# Patient Record
Sex: Male | Born: 1990 | Race: Black or African American | Hispanic: No | Marital: Single | Smoking: Former smoker
Health system: Southern US, Community
[De-identification: ages and names within clinical notes are randomized; demographics above are authoritative.]

## PROBLEM LIST (undated history)

## (undated) DIAGNOSIS — M311 Thrombotic microangiopathy: Secondary | ICD-10-CM

## (undated) DIAGNOSIS — M3119 Other thrombotic microangiopathy: Secondary | ICD-10-CM

## (undated) DIAGNOSIS — D573 Sickle-cell trait: Secondary | ICD-10-CM

---
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
## 2010-05-01 LAB — RAPID STREP SCREEN (MED CTR MEBANE ONLY): Streptococcus, Group A Screen (Direct): NEGATIVE

## 2013-04-17 ENCOUNTER — Encounter (HOSPITAL_COMMUNITY): Payer: Self-pay | Admitting: Emergency Medicine

## 2013-04-17 ENCOUNTER — Emergency Department (HOSPITAL_COMMUNITY)
Admission: EM | Admit: 2013-04-17 | Discharge: 2013-04-18 | Disposition: A | Discharge status: Home / Self Care | Payer: BC Managed Care – PPO | Attending: Emergency Medicine | Admitting: Emergency Medicine

## 2013-04-17 DIAGNOSIS — Z862 Personal history of diseases of the blood and blood-forming organs and certain disorders involving the immune mechanism: Secondary | ICD-10-CM | POA: Insufficient documentation

## 2013-04-17 DIAGNOSIS — R8271 Bacteriuria: Secondary | ICD-10-CM

## 2013-04-17 DIAGNOSIS — R82998 Other abnormal findings in urine: Secondary | ICD-10-CM | POA: Insufficient documentation

## 2013-04-17 DIAGNOSIS — F17200 Nicotine dependence, unspecified, uncomplicated: Secondary | ICD-10-CM | POA: Insufficient documentation

## 2013-04-17 HISTORY — DX: Sickle-cell trait: D57.3

## 2013-04-17 NOTE — ED Notes (Signed)
PTin BR to collect urine sample.

## 2013-04-17 NOTE — ED Notes (Signed)
Pt. requesting STD check , reports " tingling" at shaft of penis and low bladder " burning ' . Denies dysuria or hematuria. No penile discharge .

## 2013-04-17 NOTE — ED Provider Notes (Signed)
CSN: [id number]     Arrival date & time 04/17/13  2118 History   First MD Initiated Contact with Patient 04/17/13 2322    This chart was scribed for non-physician practitioner Felicie Mornavid Kashius Dominic, NP working with Hanley SeamenJohn L Molpus, MD by Valera CastleSteven Perry, ED scribe. This patient was seen in room TR10C/TR10C and the patient's care was started at 11:22 PM.  Chief Complaint  Patient presents with  . SEXUALLY TRANSMITTED DISEASE   (Consider location/radiation/quality/duration/timing/severity/associated sxs/prior Treatment) The history is provided by the patient. No language interpreter was used.   HPI Comments: Richard [name] is a 23 y.o. male who presents to the Emergency Department complaining of intermittent, burning, tingling, and itchiness around the shaft of his penis, that intermittently radiates to his testes, onset 3 days ago. He reports being sexually active, states he does not use protection. He reports only having one partner, states she does not have any symptoms. He reports being sexually active prior to this partner, sometimes with and sometimes without protection. He denies being around any unusual chemicals. He denies rash and any other associated symptoms.   PCP - No primary provider on file.  Past Medical History  Diagnosis Date  . Sickle cell trait    History reviewed. No pertinent past surgical history. No family history on file. History  Substance Use Topics  . Smoking status: Current Every Day Smoker  . Smokeless tobacco: Not on file  . Alcohol Use: Yes    Review of Systems  Constitutional: Negative for fever and chills.  Genitourinary: Negative for dysuria, hematuria, discharge, penile swelling, scrotal swelling, difficulty urinating, penile pain and testicular pain.       + for burning, tingling, itchiness around shaft of penis  Skin: Negative for rash.  All other systems reviewed and are negative.   Allergies  Review of patient's allergies indicates no known  allergies.  Home Medications  No current outpatient prescriptions on file.  BP 140/80  Pulse 96  Temp(Src) 99.1 F (37.3 C) (Oral)  Resp 20  Wt 284 lb 4 oz (128.935 kg)  SpO2 98%  Physical Exam  Nursing note and vitals reviewed. Constitutional: He is oriented to person, place, and time. He appears well-developed and well-nourished. No distress.  HENT:  Head: Normocephalic and atraumatic.  Eyes: EOM are normal.  Neck: Neck supple. No tracheal deviation present.  Cardiovascular: Normal rate.   Pulmonary/Chest: Effort normal. No respiratory distress.  Genitourinary: No penile tenderness.  Normal appearing genitalia. No lesions, rash, testicular pain.   Musculoskeletal: Normal range of motion.  Neurological: He is alert and oriented to person, place, and time.  Skin: Skin is warm and dry.  Psychiatric: He has a normal mood and affect. His behavior is normal.   ED Course  Procedures (including critical care time)  DIAGNOSTIC STUDIES: Oxygen Saturation is 98% on room air, normal by my interpretation.    COORDINATION OF CARE: 11:29 PM-Discussed treatment plan which includes UA with pt at bedside and pt agreed to plan.   Labs Review Labs Reviewed - No data to display Imaging Review No results found.   EKG Interpretation None     Medications - No data to display MDM   Final diagnoses:  None    Asymptomatic bacteruria.  Will culture and contact patient if treatment is indicated.  I personally performed the services described in this documentation, which was scribed in my presence. The recorded information has been reviewed and is accurate.    Jimmye Normanavid John Michella Detjen, NP 04/18/13  0307 

## 2013-04-18 LAB — URINALYSIS, ROUTINE W REFLEX MICROSCOPIC
Bilirubin Urine: NEGATIVE
GLUCOSE, UA: NEGATIVE mg/dL
HGB URINE DIPSTICK: NEGATIVE
Ketones, ur: NEGATIVE mg/dL
Nitrite: NEGATIVE
PH: 6 (ref 5.0–8.0)
PROTEIN: NEGATIVE mg/dL
SPECIFIC GRAVITY, URINE: 1.02 (ref 1.005–1.030)
Urobilinogen, UA: 0.2 mg/dL (ref 0.0–1.0)

## 2013-04-18 LAB — URINE MICROSCOPIC-ADD ON

## 2013-04-18 NOTE — ED Provider Notes (Signed)
Medical screening examination/treatment/procedure(s) were performed by non-physician practitioner and as supervising physician I was immediately available for consultation/collaboration.   EKG Interpretation None        Hervey Wedig, MD 04/18/13 1017 

## 2013-04-18 NOTE — Discharge Instructions (Signed)
Your urinalysis reveals some white blood cells and bacteria, although you are not currently exhibiting symptoms of a urinary tract infection.  We will culture the urine to see if treatment is needed.  You will be contacted by the flow manager if your urine culture indicates additional treatment is needed.  If you notice vesicles (small, fluid filled blisters) developing in your genital area with continued burning/itching, follow-up with the health department for evaluation of herpes.

## 2013-04-19 ENCOUNTER — Encounter (HOSPITAL_COMMUNITY): Payer: Self-pay | Admitting: Emergency Medicine

## 2013-04-19 ENCOUNTER — Emergency Department (HOSPITAL_COMMUNITY)
Admission: EM | Admit: 2013-04-19 | Discharge: 2013-04-19 | Disposition: A | Discharge status: Home / Self Care | Payer: BC Managed Care – PPO | Source: Home / Self Care | Attending: Family Medicine | Admitting: Family Medicine

## 2013-04-19 ENCOUNTER — Other Ambulatory Visit (HOSPITAL_COMMUNITY)
Admission: RE | Admit: 2013-04-19 | Discharge: 2013-04-19 | Disposition: A | Discharge status: Home / Self Care | Payer: BC Managed Care – PPO | Source: Ambulatory Visit | Attending: Family Medicine | Admitting: Family Medicine

## 2013-04-19 DIAGNOSIS — Z113 Encounter for screening for infections with a predominantly sexual mode of transmission: Secondary | ICD-10-CM | POA: Insufficient documentation

## 2013-04-19 DIAGNOSIS — N342 Other urethritis: Secondary | ICD-10-CM

## 2013-04-19 LAB — URINE CULTURE
Colony Count: 40000
Special Requests: NORMAL

## 2013-04-19 LAB — POCT URINALYSIS DIP (DEVICE)
Bilirubin Urine: NEGATIVE
Glucose, UA: NEGATIVE mg/dL
Hgb urine dipstick: NEGATIVE
KETONES UR: NEGATIVE mg/dL
Leukocytes, UA: NEGATIVE
Nitrite: NEGATIVE
PH: 5.5 (ref 5.0–8.0)
PROTEIN: NEGATIVE mg/dL
SPECIFIC GRAVITY, URINE: 1.025 (ref 1.005–1.030)
Urobilinogen, UA: 0.2 mg/dL (ref 0.0–1.0)

## 2013-04-19 MED ORDER — AZITHROMYCIN 250 MG PO TABS
1000.0000 mg | ORAL_TABLET | Freq: Once | ORAL | Status: AC
  Administered 2013-04-19: 1000 mg via ORAL

## 2013-04-19 MED ORDER — LIDOCAINE HCL (PF) 1 % IJ SOLN
INTRAMUSCULAR | Status: AC
  Filled 2013-04-19: qty 5

## 2013-04-19 MED ORDER — AZITHROMYCIN 250 MG PO TABS
ORAL_TABLET | ORAL | Status: AC
  Filled 2013-04-19: qty 4

## 2013-04-19 MED ORDER — CEFTRIAXONE SODIUM 250 MG IJ SOLR
INTRAMUSCULAR | Status: AC
  Filled 2013-04-19: qty 250

## 2013-04-19 MED ORDER — CEFTRIAXONE SODIUM 250 MG IJ SOLR
250.0000 mg | Freq: Once | INTRAMUSCULAR | Status: AC
  Administered 2013-04-19: 250 mg via INTRAMUSCULAR

## 2013-04-19 NOTE — ED Notes (Signed)
Low pelvic pain with slightly more intensity to the left.  Reports feeling the need to urinate badly, but only puts a small amount of urine when voiding.  Pain in abdomen is sharp

## 2013-04-19 NOTE — ED Notes (Signed)
Patient not ready for discharge, patient aware of post injection delay prior to discharge

## 2013-04-19 NOTE — Discharge Instructions (Signed)
You have been treated for some common causes of urethritis in adult males (chlamydia and gonorrhea) If your lab results indicate the need for additional treatment, you will be notified by telephone. If symptoms do not improve, please follow up with the primary care doctor of your choice.  Urethritis, Adult Urethritis is an inflammation of the tube through which urine exits your bladder (urethra).  CAUSES Urethritis is often caused by an infection in your urethra. The infection can be viral, like herpes. The infection can also be bacterial, like gonorrhea. RISK FACTORS Risk factors of urethritis include:  Having sex without using a condom.  Having multiple sexual partners.  Having poor hygiene. SIGNS AND SYMPTOMS Symptoms of urethritis are less noticeable in women than in men. These symptoms include:  Burning feeling when you urinate (dysuria).  Discharge from your urethra.  Blood in your urine (hematuria).  Urinating more than usual. DIAGNOSIS  To confirm a diagnosis of urethritis, your health care provider will do the following:  Ask about your sexual history.  Perform a physical exam.  Have you provide a sample of your urine for lab testing.  Use a cotton swab to gently collect a sample from your urethra for lab testing. TREATMENT  It is important to treat urethritis. Depending on the cause, untreated urethritis may lead to serious genital infections and possibly infertility. Urethritis caused by a bacterial infection is treated with antibiotics. All sexual partners must be treated.  HOME CARE INSTRUCTIONS  Do not have sex until the test results are known and treatment is completed, even if your symptoms go away before you finish treatment.  Finish all medicines that you are prescribed. SEEK MEDICAL CARE IF:   Your symptoms are not improved in 3 days.  Your symptoms are getting worse.  You develop abdominal pain or pelvic pain (in women).  You develop joint  pain. SEEK IMMEDIATE MEDICAL CARE IF:   You have a fever with a temperature of 101.67F (38.8C) or greater.  You have severe pain in the belly, back, or side.  You have repeated vomiting. Document Released: 07/08/2000 Document Revised: 11/02/2012 Document Reviewed: 09/12/2012 [facility] Patient Information 2014 [employer] [place].

## 2013-04-19 NOTE — ED Provider Notes (Signed)
CSN: [id number]     Arrival date & time 04/19/13  1346 History   First MD Initiated Contact with Patient 04/19/13 1549     Chief Complaint  Patient presents with  . Pelvic Pain   (Consider location/radiation/quality/duration/timing/severity/associated sxs/prior Treatment) HPI Comments: Patient reports few days of urinary urgency with associated frequency. Reports he is only able to urinate in small amount but feels that he needs to urinate often. No polydipsia. No genital lesions or penile discharge. States his has a "tingling" discomfort in area of mons pubis. No fever, abdominal pain, back pain, flank pain, chills, hematuria or dysuria. No hx of same.  [facility]   Patient is a 23 y.o. male presenting with pelvic pain. The history is provided by the patient.  Pelvic Pain    Past Medical History  Diagnosis Date  . Sickle cell trait    History reviewed. No pertinent past surgical history. History reviewed. No pertinent family history. History  Substance Use Topics  . Smoking status: Current Every Day Smoker  . Smokeless tobacco: Not on file  . Alcohol Use: Yes    Review of Systems  Genitourinary: Positive for pelvic pain.  All other systems reviewed and are negative.    Allergies  Review of patient's allergies indicates no known allergies.  Home Medications  No current outpatient prescriptions on file. BP 143/71  Pulse 72  Temp(Src) 98.6 F (37 C) (Oral)  Resp 16  SpO2 97% Physical Exam  Nursing note and vitals reviewed. Constitutional: He is oriented to person, place, and time. He appears well-developed and well-nourished. No distress.  HENT:  Head: Normocephalic and atraumatic.  Eyes: Conjunctivae are normal.  Cardiovascular: Normal rate, regular rhythm and normal heart sounds.   Pulmonary/Chest: Effort normal and breath sounds normal.  Abdominal: Soft. Bowel sounds are normal. He exhibits no distension and no mass. There is no  tenderness. There is no rebound, no guarding and no CVA tenderness. No hernia. Hernia confirmed negative in the ventral area, confirmed negative in the right inguinal area and confirmed negative in the left inguinal area.  Genitourinary: Testes normal and penis normal. Cremasteric reflex is present. Right testis shows no mass, no swelling and no tenderness. Left testis shows no mass, no swelling and no tenderness. Circumcised. No penile erythema or penile tenderness. No discharge found.  Musculoskeletal: Normal range of motion.  Lymphadenopathy:       Right: No inguinal adenopathy present.       Left: No inguinal adenopathy present.  Neurological: He is alert and oriented to person, place, and time.  Skin: Skin is warm and dry. No rash noted.  Psychiatric: He has a normal mood and affect. His behavior is normal.    ED Course  Procedures (including critical care time) Labs Review Labs Reviewed  URINE CULTURE  RPR  HIV ANTIBODY (ROUTINE TESTING)  POCT URINALYSIS DIP (DEVICE)  URINE CYTOLOGY ANCILLARY ONLY   Imaging Review No results found.   MDM   1. Urethritis    Urethritis: Urine C&S reviewed from 04-17-2013 ER visit and culture results consistent with contaminated specimen. Will resend clean and dirty catch urine for C&S and cytology. Will treat empirically for STI urethritis and contact patient if labs indicate need for additional treatment. Today's UA unremarkable.   Jess BartersJennifer Lee [place], [place] 04/19/13 [phone]

## 2013-04-20 LAB — URINE CULTURE
COLONY COUNT: NO GROWTH
CULTURE: NO GROWTH

## 2013-04-20 LAB — HIV ANTIBODY (ROUTINE TESTING W REFLEX): HIV: NONREACTIVE

## 2013-04-20 LAB — URINE CYTOLOGY ANCILLARY ONLY
Chlamydia: POSITIVE — AB
Neisseria Gonorrhea: NEGATIVE
Trichomonas: NEGATIVE

## 2013-04-20 LAB — RPR: RPR Ser Ql: NONREACTIVE

## 2013-04-20 NOTE — ED Provider Notes (Signed)
Medical screening examination/treatment/procedure(s) were performed by a resident physician or non-physician practitioner and as the supervising physician I was immediately available for consultation/collaboration.  Maurica Omura, MD    Socrates Cahoon S Leara Rawl, MD 04/20/13 0804 

## 2013-04-21 ENCOUNTER — Telehealth (HOSPITAL_COMMUNITY): Payer: Self-pay | Admitting: *Deleted

## 2013-04-21 NOTE — ED Notes (Signed)
GC/Trich neg., Chlamydia pos., HIV/RPR non-reactive, Urine culture: No growth.   Pt. adequately treated with Zithromax and Rocephin.  I called pt.  Pt. verified x 2 and given results.  Pt. told he was adequately treated.  Pt. instructed to notify his partner, no sex for 1 week and to practice safe sex. Pt. told he should get [facility], by appointment.  Pt. said he still has symptoms. I told him if no sex for 1 week. If symptoms persist after 1 week to get rechecked.  Pt. voiced understanding. Vassie MoselleYork, Tonjia Parillo M 04/21/2013

## 2013-04-22 NOTE — ED Notes (Signed)
DHHS form completed and faxed to the [facility]. 04/22/2013  

## 2013-07-11 ENCOUNTER — Encounter (HOSPITAL_COMMUNITY): Payer: Self-pay | Admitting: Emergency Medicine

## 2013-07-11 ENCOUNTER — Emergency Department (HOSPITAL_COMMUNITY)
Admission: EM | Admit: 2013-07-11 | Discharge: 2013-07-11 | Discharge status: Against Medical Advice | Payer: BC Managed Care – PPO | Attending: Emergency Medicine | Admitting: Emergency Medicine

## 2013-07-11 DIAGNOSIS — F17200 Nicotine dependence, unspecified, uncomplicated: Secondary | ICD-10-CM | POA: Insufficient documentation

## 2013-07-11 DIAGNOSIS — J029 Acute pharyngitis, unspecified: Secondary | ICD-10-CM | POA: Insufficient documentation

## 2013-07-11 LAB — RAPID STREP SCREEN (MED CTR MEBANE ONLY): STREPTOCOCCUS, GROUP A SCREEN (DIRECT): NEGATIVE

## 2013-07-11 NOTE — ED Notes (Signed)
Pt. reports right sore throat with swelling / pain radiating to right ear onset last night , denies fever or chills, respirations unlabored / airway intact .

## 2013-07-11 NOTE — ED Notes (Signed)
Patient up to desk inquiring about wait time. Explained triage process and acuity treatment. Patient understanding, but wishes to leave. Encouraged patient to stay, but patient explains that he will seek care with PCP tomorrow. Patient returned pager and exited department.

## 2013-07-12 LAB — CULTURE, GROUP A STREP

## 2014-02-11 ENCOUNTER — Encounter (HOSPITAL_COMMUNITY): Payer: Self-pay | Admitting: *Deleted

## 2014-02-11 ENCOUNTER — Emergency Department (HOSPITAL_COMMUNITY)
Admission: EM | Admit: 2014-02-11 | Discharge: 2014-02-11 | Disposition: A | Discharge status: Home / Self Care | Payer: BLUE CROSS/BLUE SHIELD | Attending: Emergency Medicine | Admitting: Emergency Medicine

## 2014-02-11 DIAGNOSIS — Z862 Personal history of diseases of the blood and blood-forming organs and certain disorders involving the immune mechanism: Secondary | ICD-10-CM | POA: Insufficient documentation

## 2014-02-11 DIAGNOSIS — S300XXA Contusion of lower back and pelvis, initial encounter: Secondary | ICD-10-CM | POA: Insufficient documentation

## 2014-02-11 DIAGNOSIS — F419 Anxiety disorder, unspecified: Secondary | ICD-10-CM | POA: Insufficient documentation

## 2014-02-11 DIAGNOSIS — Z720 Tobacco use: Secondary | ICD-10-CM | POA: Insufficient documentation

## 2014-02-11 DIAGNOSIS — Y9289 Other specified places as the place of occurrence of the external cause: Secondary | ICD-10-CM | POA: Insufficient documentation

## 2014-02-11 DIAGNOSIS — Z202 Contact with and (suspected) exposure to infections with a predominantly sexual mode of transmission: Secondary | ICD-10-CM | POA: Diagnosis present

## 2014-02-11 DIAGNOSIS — Y998 Other external cause status: Secondary | ICD-10-CM | POA: Insufficient documentation

## 2014-02-11 DIAGNOSIS — R300 Dysuria: Secondary | ICD-10-CM | POA: Insufficient documentation

## 2014-02-11 DIAGNOSIS — Y9389 Activity, other specified: Secondary | ICD-10-CM | POA: Insufficient documentation

## 2014-02-11 DIAGNOSIS — W11XXXA Fall on and from ladder, initial encounter: Secondary | ICD-10-CM | POA: Insufficient documentation

## 2014-02-11 NOTE — ED Notes (Signed)
Pt reports he wants to be checked for HIV and all other STD'S.

## 2014-02-11 NOTE — ED Notes (Signed)
Declined W/C at D/C and was escorted to lobby by RN. 

## 2014-02-11 NOTE — Discharge Instructions (Signed)
Please read and follow all provided instructions.  Your diagnoses today include:  1. Dysuria   2. Contusion of lower back, initial encounter    Tests performed today include:  Test for gonorrhea, chlamydia, syphilis, and HIV. You will be notified by telephone if you have a positive result.  Vital signs. See below for your results today.   Medications:  Use ibuprofen as directed on the packaging.   Home care instructions:  Read educational materials contained in this packet and follow any instructions provided.   Follow-up instructions: If you continue to have discomfort in 1 week, follow-up with the urinary tract doctor (urologist) listed.   STD Testing:  [facility] [place] [place] [facility]  [address], [place], phone [phone] or [phone]    Monday - Friday, call for an appointment  [facility] [place] [place] [facility]  [address], [place], phone [phone] or [phone]   Monday - Friday, call for an appointment  Return instructions:   Please return to the Emergency Department if you experience worsening symptoms.   Please return if you have any other emergent concerns.  Additional Information:  Your vital signs today were: BP 155/82 mmHg   Pulse 93   Temp(Src) 97.4 F (36.3 C) (Oral)   Resp 16   SpO2 99% If your blood pressure (BP) was elevated above 135/85 this visit, please have this repeated by your doctor within one month. --------------

## 2014-02-11 NOTE — ED Provider Notes (Signed)
CSN: [id number]     Arrival date & time 02/11/14  1729 History  This chart was scribed for non-physician practitioner, Rhea BleacherJosh Heatherly Stenner, PA-C, working with Gwyneth SproutWhitney Plunkett, MD, by Ronney LionSuzanne Le, ED Scribe. This patient was seen in room TR09C/TR09C and the patient's care was started at 5:49 PM.    Chief Complaint  Patient presents with  . Exposure to STD   The history is provided by the patient. No language interpreter was used.     HPI Comments: Richard [name] is a 24 y.o. male who presents to the Emergency Department for an STD check after receiving oral sex about 6 days ago. One hour after receiving oral sex, he had sudden onset tingling and sensation of pressure on the tip of his shaft, which is still intermittently ongoing, and gradual onset pelvic pressure radiating down his left groin and entire LLE. Patient had a urine sample taken at [facility] for gonorrhea, chlamydia, trichomoniasis, and UTI; all were negative. He would like to be tested for HIV.  Patient also complains of a large bruise on his left buttock that occurred when he fell off a ladder onto his buttocks at work 9 days ago. He complains of a dull ache and tingling on his right arm that he noticed when he woke up this morning, and wonders whether it may be related to the fall.   He denies significant neck pain, penile discharge, testicular pain, dysuria, urinary frequency, and any other bowel or bladder symptoms.   Past Medical History  Diagnosis Date  . Sickle cell trait    No past surgical history on file. No family history on file. History  Substance Use Topics  . Smoking status: Current Every Day Smoker  . Smokeless tobacco: Not on file  . Alcohol Use: Yes    Review of Systems  Constitutional: Negative for fever.  HENT: Negative for sore throat.   Eyes: Negative for discharge.  Gastrointestinal: Negative for diarrhea, constipation and rectal pain.  Genitourinary: Positive for dysuria and penile pain. Negative for  urgency, frequency, hematuria, decreased urine volume, discharge, difficulty urinating, genital sores and testicular pain.  Musculoskeletal: Positive for myalgias and back pain. Negative for arthralgias and neck pain.  Skin: Negative for rash.  Hematological: Negative for adenopathy.    Allergies  Review of patient's allergies indicates no known allergies.  Home Medications   Prior to Admission medications   Not on File   BP 155/82 mmHg  Pulse 93  Temp(Src) 97.4 F (36.3 C) (Oral)  Resp 16  SpO2 99%   Physical Exam  Constitutional: He is oriented to person, place, and time. He appears well-developed and well-nourished. No distress.  HENT:  Head: Normocephalic and atraumatic.  Eyes: Conjunctivae and EOM are normal.  Neck: Normal range of motion. Neck supple. No tracheal deviation present.  Cardiovascular: Normal rate.   Pulmonary/Chest: Effort normal. No respiratory distress.  Abdominal: Hernia confirmed negative in the right inguinal area and confirmed negative in the left inguinal area.  Genitourinary: Testes normal and penis normal. Right testis shows no mass, no swelling and no tenderness. Left testis shows no mass, no swelling and no tenderness. Circumcised. No penile erythema or penile tenderness. No discharge found.  Musculoskeletal: Normal range of motion.  Lymphadenopathy:       Right: No inguinal adenopathy present.       Left: No inguinal adenopathy present.  Neurological: He is alert and oriented to person, place, and time.  Skin: Skin is warm and dry.  Psychiatric:  His behavior is normal. His mood appears anxious.  Nursing note and vitals reviewed.   ED Course  Procedures (including critical care time)  DIAGNOSTIC STUDIES: Oxygen Saturation is 99% on room air, normal by my interpretation.    COORDINATION OF CARE: 5:52 PM - Discussed treatment plan with pt at bedside which includes HIV and syphilis blood test, and possible referral to urologist if symptoms  persist, and pt agreed to plan.  Vital signs reviewed and are as follows: Filed Vitals:   02/11/14 1745  BP: 155/82  Pulse: 93  Temp: 97.4 F (36.3 C)  Resp: 16   Pt informed that he will notified with positive result.    MDM   Final diagnoses:  Dysuria  Contusion of lower back, initial encounter   Contusion: NSAIDs, rest  Dysuria and lower abd discomfort: STI test pending, unclear etiology, normal exam here. Patient is very anxious. He has no difficulty with urination. No rectal pain or bowel changes to suggest prostatitis. No systemic symptoms of illness. I recommend that the patient's monitors symptoms over the next week, use NSAIDs as needed, follow-up with urology in 1 week if symptoms are not improving for further evaluation.  I personally performed the services described in this documentation, which was scribed in my presence. The recorded information has been reviewed and is accurate.      Renne Crigler, PA-C 02/11/14 1824  Gwyneth Sprout, MD 02/11/14 2237

## 2014-02-12 LAB — GC/CHLAMYDIA PROBE AMP (~~LOC~~) NOT AT ARMC
Chlamydia: NEGATIVE
NEISSERIA GONORRHEA: NEGATIVE

## 2014-02-13 ENCOUNTER — Telehealth (HOSPITAL_COMMUNITY): Payer: Self-pay

## 2014-02-13 ENCOUNTER — Telehealth (HOSPITAL_BASED_OUTPATIENT_CLINIC_OR_DEPARTMENT_OTHER): Payer: Self-pay | Admitting: Emergency Medicine

## 2014-02-13 LAB — HIV ANTIBODY (ROUTINE TESTING W REFLEX): HIV-1/HIV-2 Ab: NONREACTIVE

## 2014-02-13 NOTE — Telephone Encounter (Signed)
Pt call for HIV and RPR result informed they are still in process.

## 2014-02-14 ENCOUNTER — Telehealth (HOSPITAL_BASED_OUTPATIENT_CLINIC_OR_DEPARTMENT_OTHER): Payer: Self-pay | Admitting: Emergency Medicine

## 2014-02-14 ENCOUNTER — Encounter (HOSPITAL_COMMUNITY): Payer: Self-pay | Admitting: Emergency Medicine

## 2014-02-14 ENCOUNTER — Emergency Department (HOSPITAL_COMMUNITY)
Admission: EM | Admit: 2014-02-14 | Discharge: 2014-02-14 | Disposition: A | Discharge status: Home / Self Care | Payer: BLUE CROSS/BLUE SHIELD | Attending: Emergency Medicine | Admitting: Emergency Medicine

## 2014-02-14 DIAGNOSIS — Z720 Tobacco use: Secondary | ICD-10-CM | POA: Insufficient documentation

## 2014-02-14 DIAGNOSIS — Z862 Personal history of diseases of the blood and blood-forming organs and certain disorders involving the immune mechanism: Secondary | ICD-10-CM | POA: Insufficient documentation

## 2014-02-14 DIAGNOSIS — R1030 Lower abdominal pain, unspecified: Secondary | ICD-10-CM | POA: Diagnosis not present

## 2014-02-14 DIAGNOSIS — R202 Paresthesia of skin: Secondary | ICD-10-CM | POA: Insufficient documentation

## 2014-02-14 DIAGNOSIS — J029 Acute pharyngitis, unspecified: Secondary | ICD-10-CM | POA: Diagnosis not present

## 2014-02-14 DIAGNOSIS — B370 Candidal stomatitis: Secondary | ICD-10-CM | POA: Diagnosis present

## 2014-02-14 LAB — RAPID STREP SCREEN (MED CTR MEBANE ONLY): STREPTOCOCCUS, GROUP A SCREEN (DIRECT): NEGATIVE

## 2014-02-14 MED ORDER — IBUPROFEN 600 MG PO TABS: 600.0000 mg | ORAL_TABLET | Freq: Four times a day (QID) | ORAL | Status: DC | PRN

## 2014-02-14 NOTE — ED Notes (Signed)
Pt made aware to return if symptoms worsen or if any life threatening symptoms occur.   

## 2014-02-14 NOTE — ED Provider Notes (Signed)
CSN: [id number]     Arrival date & time 02/14/14  0052 History  This chart was scribed for Toy CookeyMegan Docherty, MD by Bronson CurbJacqueline Melvin, ED Scribe. This patient was seen in room A02C/A02C and the patient's care was started at 1:47 AM.   Chief Complaint  Patient presents with  . Thrush    Patient is a 24 y.o. male presenting with mouth sores. The history is provided by the patient. No language interpreter was used.  Mouth Lesions Location:  Posterior pharynx Quality:  White and red Onset quality:  Sudden Severity:  Moderate Progression:  Unchanged Chronicity:  New Relieved by:  None tried Worsened by:  Nothing tried Ineffective treatments:  None tried Associated symptoms: no congestion, no fever, no rash and no rhinorrhea      HPI Comments: Richard [name] is a 24 y.o. male who presents to the Emergency Department complaining of white patches and bumps that began a few hours ago. There is associated intermittent penile tingling sensation, and dull, lower abdominal pain that radiates down the left groin and entire left lower extremity. Patient is concerned his symptoms are related to herpes. He also notes muscles aches to the left arm, but states he works at [employer] and suspects this could be the cause. Patient states he was seen here 3 days ago for the same symptoms and reports he was checked for STDs after receiving oral sex. Patient reports history of tonsillitis. He denies fever, cough, nausea, vomiting, rashes/bumps in other area, penile lesions, penile discharge, testicular pain/swelling, dysuria.    Past Medical History  Diagnosis Date  . Sickle cell trait    History reviewed. No pertinent past surgical history. No family history on file. History  Substance Use Topics  . Smoking status: Current Every Day Smoker  . Smokeless tobacco: Not on file  . Alcohol Use: Yes    Review of Systems  Constitutional: Negative for fever, activity change, appetite change and fatigue.  HENT: Positive  for mouth sores. Negative for congestion, facial swelling, rhinorrhea and trouble swallowing.   Eyes: Negative for photophobia and pain.  Respiratory: Negative for cough, chest tightness and shortness of breath.   Cardiovascular: Negative for chest pain and leg swelling.  Gastrointestinal: Positive for abdominal pain. Negative for nausea, vomiting, diarrhea and constipation.  Endocrine: Negative for polydipsia and polyuria.  Genitourinary: Negative for dysuria, urgency, decreased urine volume, discharge, scrotal swelling, difficulty urinating, penile pain and testicular pain.  Musculoskeletal: Positive for myalgias. Negative for back pain and gait problem.  Skin: Negative for color change, rash and wound.  Allergic/Immunologic: Negative for immunocompromised state.  Neurological: Negative for dizziness, facial asymmetry, speech difficulty, weakness, numbness and headaches.  Psychiatric/Behavioral: Negative for confusion, decreased concentration and agitation.      Allergies  Review of patient's allergies indicates no known allergies.  Home Medications   Prior to Admission medications   Medication Sig Start Date End Date Taking? Authorizing Provider  ibuprofen (ADVIL,MOTRIN) 600 MG tablet Take 1 tablet (600 mg total) by mouth every 6 (six) hours as needed for mild pain or moderate pain. 02/14/14   Toy CookeyMegan Docherty, MD   Triage Vitals: BP 122/100 mmHg  Pulse 86  Temp(Src) 98.3 F (36.8 C) (Oral)  Resp 16  Ht 6' (1.829 m)  Wt 245 lb (111.131 kg)  BMI 33.22 kg/m2  SpO2 95%  Physical Exam  Constitutional: He is oriented to person, place, and time. He appears well-developed and well-nourished. No distress.  HENT:  Head: Normocephalic and atraumatic.  Mouth/Throat: Oropharyngeal exudate present.  Left sided tonsillar exudate.   Eyes: Pupils are equal, round, and reactive to light.  Neck: Normal range of motion. Neck supple.  Cardiovascular: Normal rate, regular rhythm and normal  heart sounds.  Exam reveals no gallop and no friction rub.   No murmur heard. Pulmonary/Chest: Effort normal and breath sounds normal. No respiratory distress. He has no wheezes. He has no rales.  Abdominal: Soft. Bowel sounds are normal. He exhibits no distension and no mass. There is no tenderness. There is no rebound and no guarding.  Musculoskeletal: Normal range of motion. He exhibits no edema or tenderness.  Lymphadenopathy:    He has cervical adenopathy.  Left cervical lymphadenopathy.  Neurological: He is alert and oriented to person, place, and time.  Skin: Skin is warm and dry.  Psychiatric: He has a normal mood and affect.  Nursing note and vitals reviewed.   ED Course  Procedures (including critical care time)  DIAGNOSTIC STUDIES: Oxygen Saturation is 95% on room air, adequate by my interpretation.    COORDINATION OF CARE: At 0157 Discussed treatment plan with patient. Patient agrees.   Labs Review Labs Reviewed  RAPID STREP SCREEN  CULTURE, GROUP A STREP    Imaging Review No results found.   EKG Interpretation None      MDM   Final diagnoses:  Pharyngitis   patient presents with white patches and red bumps on his posterior oropharynx.  Several days after unprotected oral sex.  He denies dysuria, penile discharge.  He was seen 2 days prior and was screened for, HIV, syphilis, gonorrhea and chlamydia, all of which were negative.  On physical exam, vital signs are stable.  He is afebrile and in no acute distress.  I cannot appreciate white patches.  He has mildly enlarged, symmetrical tonsils with small amount of white exudate on right tonsil.  I cannot appreciate erythematous papules that he speaks of.  I see no evidence of herpes.  Rapid strep sent and was normal.  We'll recommend continued supportive care.    I personally performed the services described in this documentation, which was scribed in my presence. The recorded information has been reviewed and is  accurate.     Toy Cookey, MD 02/16/14 [phone]

## 2014-02-14 NOTE — Discharge Instructions (Signed)

## 2014-02-14 NOTE — ED Notes (Signed)
Pt reports white patches and red bumps on back of tongue.  Pt is concerned for oral herpes.  Pt also reports being seen here 2 days ago for lower abdominal pain radiating down lower leg and a feeling of "something coming out of the head of my penis but i dont have any discharge."

## 2014-02-14 NOTE — ED Notes (Signed)
Pt. reports white patches at posterior tongue and inner wall of cheeks onset this evening , pt. concerned about oral herpes.

## 2014-02-15 ENCOUNTER — Telehealth (HOSPITAL_COMMUNITY): Payer: Self-pay

## 2014-02-15 LAB — CULTURE, GROUP A STREP

## 2014-02-15 NOTE — Telephone Encounter (Signed)
Pt calling for HIV and RPR results.  Informed HIV non reactive and RPR still in process.

## 2014-02-17 LAB — RPR: RPR: NONREACTIVE

## 2014-12-21 ENCOUNTER — Encounter (HOSPITAL_COMMUNITY): Payer: Self-pay | Admitting: Emergency Medicine

## 2014-12-21 ENCOUNTER — Emergency Department (HOSPITAL_COMMUNITY)
Admission: EM | Admit: 2014-12-21 | Discharge: 2014-12-22 | Disposition: A | Discharge status: Home / Self Care | Payer: BLUE CROSS/BLUE SHIELD | Attending: Emergency Medicine | Admitting: Emergency Medicine

## 2014-12-21 DIAGNOSIS — Z862 Personal history of diseases of the blood and blood-forming organs and certain disorders involving the immune mechanism: Secondary | ICD-10-CM | POA: Diagnosis not present

## 2014-12-21 DIAGNOSIS — M1990 Unspecified osteoarthritis, unspecified site: Secondary | ICD-10-CM | POA: Diagnosis not present

## 2014-12-21 DIAGNOSIS — R5383 Other fatigue: Secondary | ICD-10-CM | POA: Diagnosis not present

## 2014-12-21 DIAGNOSIS — F17200 Nicotine dependence, unspecified, uncomplicated: Secondary | ICD-10-CM | POA: Insufficient documentation

## 2014-12-21 DIAGNOSIS — M791 Myalgia, unspecified site: Secondary | ICD-10-CM

## 2014-12-21 DIAGNOSIS — R21 Rash and other nonspecific skin eruption: Secondary | ICD-10-CM | POA: Diagnosis present

## 2014-12-21 DIAGNOSIS — R51 Headache: Secondary | ICD-10-CM | POA: Diagnosis not present

## 2014-12-21 DIAGNOSIS — R509 Fever, unspecified: Secondary | ICD-10-CM | POA: Diagnosis not present

## 2014-12-21 LAB — CBC WITH DIFFERENTIAL/PLATELET
BASOS ABS: 0.1 10*3/uL (ref 0.0–0.1)
Basophils Relative: 1 %
Eosinophils Absolute: 0.1 10*3/uL (ref 0.0–0.7)
Eosinophils Relative: 1 %
HEMATOCRIT: 46.7 % (ref 39.0–52.0)
HEMOGLOBIN: 16 g/dL (ref 13.0–17.0)
LYMPHS PCT: 25 %
Lymphs Abs: 1.9 10*3/uL (ref 0.7–4.0)
MCH: 31.2 pg (ref 26.0–34.0)
MCHC: 34.3 g/dL (ref 30.0–36.0)
MCV: 91 fL (ref 78.0–100.0)
MONO ABS: 1.7 10*3/uL — AB (ref 0.1–1.0)
Monocytes Relative: 22 %
NEUTROS ABS: 3.9 10*3/uL (ref 1.7–7.7)
NEUTROS PCT: 51 %
Platelets: 251 10*3/uL (ref 150–400)
RBC: 5.13 MIL/uL (ref 4.22–5.81)
RDW: 12.7 % (ref 11.5–15.5)
WBC: 7.6 10*3/uL (ref 4.0–10.5)

## 2014-12-21 LAB — URINALYSIS, ROUTINE W REFLEX MICROSCOPIC
BILIRUBIN URINE: NEGATIVE
GLUCOSE, UA: NEGATIVE mg/dL
HGB URINE DIPSTICK: NEGATIVE
Ketones, ur: NEGATIVE mg/dL
Leukocytes, UA: NEGATIVE
Nitrite: NEGATIVE
PROTEIN: NEGATIVE mg/dL
Specific Gravity, Urine: 1.013 (ref 1.005–1.030)
pH: 7.5 (ref 5.0–8.0)

## 2014-12-21 LAB — COMPREHENSIVE METABOLIC PANEL
ALBUMIN: 4.7 g/dL (ref 3.5–5.0)
ALK PHOS: 86 U/L (ref 38–126)
ALT: 32 U/L (ref 17–63)
AST: 31 U/L (ref 15–41)
Anion gap: 8 (ref 5–15)
BILIRUBIN TOTAL: 1.3 mg/dL — AB (ref 0.3–1.2)
BUN: 7 mg/dL (ref 6–20)
CALCIUM: 9.3 mg/dL (ref 8.9–10.3)
CO2: 25 mmol/L (ref 22–32)
CREATININE: 1.18 mg/dL (ref 0.61–1.24)
Chloride: 103 mmol/L (ref 101–111)
GFR calc Af Amer: >60 mL/min (ref >60)
GLUCOSE: 83 mg/dL (ref 65–99)
POTASSIUM: 4 mmol/L (ref 3.5–5.1)
Sodium: 136 mmol/L (ref 135–145)
TOTAL PROTEIN: 8 g/dL (ref 6.5–8.1)

## 2014-12-21 MED ORDER — PREDNISONE 20 MG PO TABS
60.0000 mg | ORAL_TABLET | Freq: Once | ORAL | Status: AC
  Administered 2014-12-22: 60 mg via ORAL
  Filled 2014-12-21: qty 3

## 2014-12-21 NOTE — ED Provider Notes (Addendum)
CSN: [id number]     Arrival date & time 12/21/14  1812 History   First MD Initiated Contact with Patient 12/21/14 2302     Chief Complaint  Patient presents with  . Rash  . Fever     (Consider location/radiation/quality/duration/timing/severity/associated sxs/prior Treatment) HPI Comments: patie  Patient is a 24 y.o. male presenting with rash and fever. The history is provided by the patient.  Rash Location:  Full body Quality: burning and itchiness   Severity:  Moderate Onset quality:  Sudden Timing:  Constant Progression:  Worsening Chronicity:  New Context: not animal contact, not chemical exposure, not diapers, not eggs, not exposure to similar rash, not food, not hot tub use, not insect bite/sting, not medications, not new detergent/soap, not nuts, not plant contact, not pollen, not pregnancy, not sick contacts and not sun exposure   Relieved by:  None tried Associated symptoms: fatigue, fever (subjective), headaches, joint pain and myalgias   Associated symptoms: no abdominal pain, no diarrhea, no hoarse voice, no induration, no nausea, no periorbital edema, no shortness of breath, no sore throat, no throat swelling, no tongue swelling, no URI, not vomiting and not wheezing   Fever Associated symptoms: headaches, myalgias and rash   Associated symptoms: no diarrhea, no nausea, no sore throat and no vomiting     Past Medical History  Diagnosis Date  . Sickle cell trait (HCC)    History reviewed. No pertinent past surgical history. History reviewed. No pertinent family history. Social History  Substance Use Topics  . Smoking status: Current Every Day Smoker  . Smokeless tobacco: None  . Alcohol Use: Yes    Review of Systems  Constitutional: Positive for fever (subjective) and fatigue.  HENT: Negative for hoarse voice and sore throat.   Respiratory: Negative for shortness of breath and wheezing.   Gastrointestinal: Negative for nausea, vomiting, abdominal pain and  diarrhea.  Musculoskeletal: Positive for myalgias and arthralgias.  Skin: Positive for rash.  Neurological: Positive for headaches.      Allergies  Review of patient's allergies indicates no known allergies.  Home Medications   Prior to Admission medications   Medication Sig Start Date End Date Taking? Authorizing Provider  Phenylephrine-Pheniramine-DM [facility](THERAFLU COLD & COUGH) 11-15-18 MG PACK Take 1 packet by mouth every 6 (six) hours as needed (for cold symptoms).   Yes Historical Provider, MD  PRESCRIPTION MEDICATION Inhale 1-2 puffs into the lungs every 4 (four) hours as needed (for shortness of breath). Patient used significant other albuterol inhaler due to being short of breath.   Yes Historical Provider, MD   BP 131/89 mmHg  Pulse 92  Temp(Src) 98.9 F (37.2 C) (Oral)  Resp 22  Ht 5\' 11"  (1.803 m)  Wt 120.657 kg  BMI 37.12 kg/m2  SpO2 98% Physical Exam  Constitutional: He is oriented to person, place, and time. He appears well-developed and well-nourished. No distress.  HENT:  Head: Normocephalic and atraumatic.  Eyes: Conjunctivae are normal. No scleral icterus.  Neck: Normal range of motion. Neck supple.  Cardiovascular: Normal rate, regular rhythm and normal heart sounds.   Pulmonary/Chest: Effort normal and breath sounds normal. No respiratory distress.  Abdominal: Soft. There is no tenderness.  Musculoskeletal: He exhibits no edema.  Neurological: He is alert and oriented to person, place, and time.  Skin: Skin is warm and dry. Rash noted. He is not diaphoretic.  Psychiatric: His behavior is normal.  Nursing note and vitals reviewed.         ED  Course  Procedures (including critical care time) Labs Review Labs Reviewed  CBC WITH DIFFERENTIAL/PLATELET - Abnormal; Notable for the following:    Monocytes Absolute 1.7 (*)    All other components within normal limits  COMPREHENSIVE METABOLIC PANEL - Abnormal; Notable for the following:    Total  Bilirubin 1.3 (*)    All other components within normal limits  URINALYSIS, ROUTINE W REFLEX MICROSCOPIC (NOT AT [facility])    Imaging Review No results found. I have personally reviewed and evaluated these images and lab results as part of my medical decision-making.   EKG Interpretation None      MDM   Final diagnoses:  None    Patient with generalized nonspecific pruritic rash. He will be discharged with Atarax and prednisone No signs of secondary infection. He should follow-up with her primary care doctor. Discussed return precautions. He appears safe for discharge at this time.    Arthor Captain, PA-C 12/24/14 1758  Derwood Kaplan, MD 12/24/14 0454  Arthor Captain, PA-C 01/10/15 1704  Derwood Kaplan, MD 01/11/15 [phone]

## 2014-12-21 NOTE — ED Notes (Signed)
Pt comes to Ed with c/o of generalized weakness and full body rash That has developed over the last 24 hrs. Rash is located in groin, buttocks, stomach, back, arms, legs.  Pt reports vomiting this morning and and generalized weakness that is normal for baseline. Pt reports feeling tired often, had SOB this morning on awaking.

## 2014-12-22 MED ORDER — PREDNISONE 20 MG PO TABS: 40.0000 mg | ORAL_TABLET | Freq: Every day | ORAL | Status: DC

## 2014-12-22 MED ORDER — HYDROXYZINE HCL 25 MG PO TABS: 25.0000 mg | ORAL_TABLET | Freq: Four times a day (QID) | ORAL | Status: DC

## 2014-12-22 NOTE — Discharge Instructions (Signed)

## 2015-03-17 ENCOUNTER — Emergency Department (HOSPITAL_COMMUNITY)
Admission: EM | Admit: 2015-03-17 | Discharge: 2015-03-17 | Disposition: A | Discharge status: Home / Self Care | Payer: BLUE CROSS/BLUE SHIELD | Attending: Emergency Medicine | Admitting: Emergency Medicine

## 2015-03-17 ENCOUNTER — Encounter (HOSPITAL_COMMUNITY): Payer: Self-pay | Admitting: Emergency Medicine

## 2015-03-17 DIAGNOSIS — Z862 Personal history of diseases of the blood and blood-forming organs and certain disorders involving the immune mechanism: Secondary | ICD-10-CM | POA: Insufficient documentation

## 2015-03-17 DIAGNOSIS — Z7952 Long term (current) use of systemic steroids: Secondary | ICD-10-CM | POA: Insufficient documentation

## 2015-03-17 DIAGNOSIS — J029 Acute pharyngitis, unspecified: Secondary | ICD-10-CM | POA: Diagnosis present

## 2015-03-17 DIAGNOSIS — Z79899 Other long term (current) drug therapy: Secondary | ICD-10-CM | POA: Diagnosis not present

## 2015-03-17 DIAGNOSIS — F17200 Nicotine dependence, unspecified, uncomplicated: Secondary | ICD-10-CM | POA: Insufficient documentation

## 2015-03-17 MED ORDER — AMOXICILLIN 500 MG PO CAPS: 500.0000 mg | ORAL_CAPSULE | Freq: Three times a day (TID) | ORAL | Status: DC

## 2015-03-17 MED ORDER — AMOXICILLIN 500 MG PO CAPS
500.0000 mg | ORAL_CAPSULE | Freq: Once | ORAL | Status: AC
  Administered 2015-03-17: 500 mg via ORAL
  Filled 2015-03-17: qty 1

## 2015-03-17 NOTE — Discharge Instructions (Signed)

## 2015-03-17 NOTE — ED Notes (Signed)
Pt c/o sore throat starting one week prior, says he feels like his throat has been swelling and it has become painful to swallow. Tonsils appear swollen, states occasionally difficult to breath d/t the swelling.

## 2015-03-17 NOTE — ED Notes (Signed)
Notified MD of patient status, he said as long as vitals were stable and there was no obvious work of breathing patient should be okay to wait in lobby at this time.

## 2015-03-17 NOTE — ED Provider Notes (Signed)
CSN: [id number]     Arrival date & time 03/17/15  1756 History   First MD Initiated Contact with Patient 03/17/15 2154     Chief Complaint  Patient presents with  . Oral Swelling  . Sore Throat     (Consider location/radiation/quality/duration/timing/severity/associated sxs/prior Treatment) HPI Comments: Patient presents to the emergency department with chief complaint of sore throat. He states that he has been having the sore throat for the past week. 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 Denies fevers or chills. States that he has pain with swallowing. He has a history of peritonsillar abscess, and wanted to be evaluated for this. He denies any cough. He has tried using cough drops with some relief.  The history is provided by the patient. No language interpreter was used.    Past Medical History  Diagnosis Date  . Sickle cell trait (HCC)    History reviewed. No pertinent past surgical history. History reviewed. No pertinent family history. Social History  Substance Use Topics  . Smoking status: Current Every Day Smoker  . Smokeless tobacco: None  . Alcohol Use: Yes    Review of Systems  Constitutional: Negative for fever and chills.  HENT: Positive for sore throat. Negative for drooling and postnasal drip.   Respiratory: Negative for cough and shortness of breath.   Gastrointestinal: Negative for nausea, vomiting, diarrhea and constipation.      Allergies  Review of patient's allergies indicates no known allergies.  Home Medications   Prior to Admission medications   Medication Sig Start Date End Date Taking? Authorizing Provider  hydrOXYzine (ATARAX/VISTARIL) 25 MG tablet Take 1 tablet (25 mg total) by mouth every 6 (six) hours. 12/22/14   Arthor Captain, PA-C  Phenylephrine-Pheniramine-DM [facility] COLD & COUGH) 11-15-18 MG PACK Take 1 packet by mouth every 6 (six) hours as needed (for cold symptoms).    Historical Provider, MD   predniSONE (DELTASONE) 20 MG tablet Take 2 tablets (40 mg total) by mouth daily. 12/22/14   Arthor Captain, PA-C  PRESCRIPTION MEDICATION Inhale 1-2 puffs into the lungs every 4 (four) hours as needed (for shortness of breath). Patient used significant other albuterol inhaler due to being short of breath.    Historical Provider, MD   BP 136/80 mmHg  Pulse 87  Temp(Src) 98.4 F (36.9 C) (Oral)  Resp 16  SpO2 96% Physical Exam  Constitutional: He is oriented to person, place, and time. He appears well-developed and well-nourished.  HENT:  Head: Normocephalic and atraumatic.  Oropharynx is erythematous, tonsils are mildly swollen, no exudates, no abscess, no peritonsillar abscess, airway is intact, no stridor, normal phonation  Eyes: Conjunctivae and EOM are normal. Pupils are equal, round, and reactive to light. Right eye exhibits no discharge. Left eye exhibits no discharge. No scleral icterus.  Neck: Normal range of motion. Neck supple. No JVD present.  Cardiovascular: Normal rate, regular rhythm and normal heart sounds.  Exam reveals no gallop and no friction rub.   No murmur heard. Pulmonary/Chest: Effort normal and breath sounds normal. No respiratory distress. He has no wheezes. He has no rales. He exhibits no tenderness.  Abdominal: Soft. He exhibits no distension and no mass. There is no tenderness. There is no rebound and no guarding.  Musculoskeletal: Normal range of motion. He exhibits no edema or tenderness.  Neurological: He is alert and oriented to person, place, and time.  Skin: Skin is warm and dry.  Psychiatric: He has a normal mood  and affect. His behavior is normal. Judgment and thought content normal.  Nursing note and vitals reviewed.   ED Course  Procedures (including critical care time)   MDM   Final diagnoses:  Pharyngitis    Patient with sore throat.  Will treat with amoxicillin and discharge to home.  No peritonsillar abscess.  Airway intact.  VSS.    Roxy Horseman, PA-C 03/17/15 2211  Dione Booze, MD 03/17/15 [phone]

## 2015-10-08 ENCOUNTER — Emergency Department (HOSPITAL_COMMUNITY): Payer: BLUE CROSS/BLUE SHIELD

## 2015-10-08 ENCOUNTER — Other Ambulatory Visit: Payer: Self-pay

## 2015-10-08 ENCOUNTER — Encounter (HOSPITAL_COMMUNITY): Payer: Self-pay

## 2015-10-08 ENCOUNTER — Emergency Department (HOSPITAL_COMMUNITY)
Admission: EM | Admit: 2015-10-08 | Discharge: 2015-10-08 | Disposition: A | Discharge status: Home / Self Care | Payer: BLUE CROSS/BLUE SHIELD | Attending: Emergency Medicine | Admitting: Emergency Medicine

## 2015-10-08 ENCOUNTER — Telehealth: Payer: Self-pay

## 2015-10-08 DIAGNOSIS — R823 Hemoglobinuria: Secondary | ICD-10-CM | POA: Insufficient documentation

## 2015-10-08 DIAGNOSIS — Y990 Civilian activity done for income or pay: Secondary | ICD-10-CM | POA: Diagnosis not present

## 2015-10-08 DIAGNOSIS — S6991XA Unspecified injury of right wrist, hand and finger(s), initial encounter: Secondary | ICD-10-CM | POA: Diagnosis present

## 2015-10-08 DIAGNOSIS — Z79899 Other long term (current) drug therapy: Secondary | ICD-10-CM | POA: Diagnosis not present

## 2015-10-08 DIAGNOSIS — T148XXA Other injury of unspecified body region, initial encounter: Secondary | ICD-10-CM

## 2015-10-08 DIAGNOSIS — Z23 Encounter for immunization: Secondary | ICD-10-CM | POA: Diagnosis not present

## 2015-10-08 DIAGNOSIS — S61240A Puncture wound with foreign body of right index finger without damage to nail, initial encounter: Secondary | ICD-10-CM | POA: Insufficient documentation

## 2015-10-08 DIAGNOSIS — Y9389 Activity, other specified: Secondary | ICD-10-CM | POA: Diagnosis not present

## 2015-10-08 DIAGNOSIS — R809 Proteinuria, unspecified: Secondary | ICD-10-CM

## 2015-10-08 DIAGNOSIS — F17200 Nicotine dependence, unspecified, uncomplicated: Secondary | ICD-10-CM | POA: Diagnosis not present

## 2015-10-08 DIAGNOSIS — Y929 Unspecified place or not applicable: Secondary | ICD-10-CM | POA: Diagnosis not present

## 2015-10-08 DIAGNOSIS — R109 Unspecified abdominal pain: Secondary | ICD-10-CM | POA: Insufficient documentation

## 2015-10-08 DIAGNOSIS — W010XXA Fall on same level from slipping, tripping and stumbling without subsequent striking against object, initial encounter: Secondary | ICD-10-CM | POA: Diagnosis not present

## 2015-10-08 DIAGNOSIS — S61232A Puncture wound without foreign body of right middle finger without damage to nail, initial encounter: Secondary | ICD-10-CM | POA: Insufficient documentation

## 2015-10-08 DIAGNOSIS — IMO0002 Reserved for concepts with insufficient information to code with codable children: Secondary | ICD-10-CM

## 2015-10-08 DIAGNOSIS — N390 Urinary tract infection, site not specified: Secondary | ICD-10-CM | POA: Insufficient documentation

## 2015-10-08 DIAGNOSIS — D596 Hemoglobinuria due to hemolysis from other external causes: Secondary | ICD-10-CM

## 2015-10-08 DIAGNOSIS — Z720 Tobacco use: Secondary | ICD-10-CM

## 2015-10-08 LAB — HEPATIC FUNCTION PANEL
ALK PHOS: 63 U/L (ref 38–126)
ALT: 22 U/L (ref 17–63)
AST: 43 U/L — ABNORMAL HIGH (ref 15–41)
Albumin: 4.5 g/dL (ref 3.5–5.0)
BILIRUBIN DIRECT: 0.2 mg/dL (ref 0.1–0.5)
BILIRUBIN INDIRECT: 3.6 mg/dL — AB (ref 0.3–0.9)
BILIRUBIN TOTAL: 3.8 mg/dL — AB (ref 0.3–1.2)
TOTAL PROTEIN: 7.1 g/dL (ref 6.5–8.1)

## 2015-10-08 LAB — URINE MICROSCOPIC-ADD ON
RBC / HPF: NONE SEEN RBC/hpf (ref 0–5)
Squamous Epithelial / LPF: NONE SEEN

## 2015-10-08 LAB — URINALYSIS, ROUTINE W REFLEX MICROSCOPIC
GLUCOSE, UA: NEGATIVE mg/dL
Ketones, ur: 40 mg/dL — AB
Nitrite: POSITIVE — AB
PH: 6 (ref 5.0–8.0)
Protein, ur: >300 mg/dL — AB
SPECIFIC GRAVITY, URINE: 1.02 (ref 1.005–1.030)

## 2015-10-08 LAB — PROTIME-INR
INR: 0.98
PROTHROMBIN TIME: 13 s (ref 11.4–15.2)

## 2015-10-08 LAB — DIRECT ANTIGLOBULIN TEST (NOT AT ARMC)
DAT, COMPLEMENT: NEGATIVE
DAT, IGG: NEGATIVE

## 2015-10-08 LAB — CBC WITH DIFFERENTIAL/PLATELET
Basophils Absolute: 0 10*3/uL (ref 0.0–0.1)
Basophils Relative: 0 %
Eosinophils Absolute: 0.1 10*3/uL (ref 0.0–0.7)
Eosinophils Relative: 1 %
HCT: 42.5 % (ref 39.0–52.0)
Hemoglobin: 14.6 g/dL (ref 13.0–17.0)
Lymphocytes Relative: 30 %
Lymphs Abs: 4.1 10*3/uL — ABNORMAL HIGH (ref 0.7–4.0)
MCH: 31 pg (ref 26.0–34.0)
MCHC: 34.4 g/dL (ref 30.0–36.0)
MCV: 90.2 fL (ref 78.0–100.0)
Monocytes Absolute: 1.4 10*3/uL — ABNORMAL HIGH (ref 0.1–1.0)
Monocytes Relative: 10 %
Neutro Abs: 8.2 10*3/uL — ABNORMAL HIGH (ref 1.7–7.7)
Neutrophils Relative %: 59 %
Platelets: 227 10*3/uL (ref 150–400)
RBC: 4.71 MIL/uL (ref 4.22–5.81)
RDW: 12.9 % (ref 11.5–15.5)
WBC: 13.8 10*3/uL — ABNORMAL HIGH (ref 4.0–10.5)

## 2015-10-08 LAB — COMPREHENSIVE METABOLIC PANEL
ALT: 20 U/L (ref 17–63)
AST: 43 U/L — ABNORMAL HIGH (ref 15–41)
Albumin: 4.4 g/dL (ref 3.5–5.0)
Alkaline Phosphatase: 61 U/L (ref 38–126)
Anion gap: 5 (ref 5–15)
BUN: 11 mg/dL (ref 6–20)
CO2: 24 mmol/L (ref 22–32)
Calcium: 9.5 mg/dL (ref 8.9–10.3)
Chloride: 109 mmol/L (ref 101–111)
Creatinine, Ser: 1.21 mg/dL (ref 0.61–1.24)
GFR calc Af Amer: >60 mL/min (ref >60)
GFR calc non Af Amer: >60 mL/min (ref >60)
Glucose, Bld: 89 mg/dL (ref 65–99)
Potassium: 4.7 mmol/L (ref 3.5–5.1)
Sodium: 138 mmol/L (ref 135–145)
Total Bilirubin: 4.8 mg/dL — ABNORMAL HIGH (ref 0.3–1.2)
Total Protein: 7.5 g/dL (ref 6.5–8.1)

## 2015-10-08 LAB — RAPID URINE DRUG SCREEN, HOSP PERFORMED
Amphetamines: NOT DETECTED
Barbiturates: NOT DETECTED
Benzodiazepines: NOT DETECTED
COCAINE: NOT DETECTED
OPIATES: NOT DETECTED
TETRAHYDROCANNABINOL: NOT DETECTED

## 2015-10-08 LAB — RETICULOCYTES
RBC.: 4.5 MIL/uL (ref 4.22–5.81)
RETIC COUNT ABSOLUTE: 103.5 10*3/uL (ref 19.0–186.0)
Retic Ct Pct: 2.3 % (ref 0.4–3.1)

## 2015-10-08 LAB — LIPASE, BLOOD: Lipase: 60 U/L — ABNORMAL HIGH (ref 11–51)

## 2015-10-08 LAB — CK: Total CK: 389 U/L (ref 49–397)

## 2015-10-08 MED ORDER — AZITHROMYCIN 250 MG PO TABS
1000.0000 mg | ORAL_TABLET | Freq: Once | ORAL | Status: AC
  Administered 2015-10-08: 1000 mg via ORAL
  Filled 2015-10-08: qty 4

## 2015-10-08 MED ORDER — STERILE WATER FOR INJECTION IJ SOLN
INTRAMUSCULAR | Status: AC
  Administered 2015-10-08: 10 mL
  Filled 2015-10-08: qty 10

## 2015-10-08 MED ORDER — CIPROFLOXACIN HCL 500 MG PO TABS: 500.0000 mg | ORAL_TABLET | Freq: Two times a day (BID) | ORAL | 0 refills | Status: DC

## 2015-10-08 MED ORDER — CEFTRIAXONE SODIUM 250 MG IJ SOLR
250.0000 mg | Freq: Once | INTRAMUSCULAR | Status: AC
  Administered 2015-10-08: 250 mg via INTRAMUSCULAR
  Filled 2015-10-08: qty 250

## 2015-10-08 MED ORDER — TETANUS-DIPHTH-ACELL PERTUSSIS 5-2.5-18.5 LF-MCG/0.5 IM SUSP
0.5000 mL | Freq: Once | INTRAMUSCULAR | Status: AC
  Administered 2015-10-08: 0.5 mL via INTRAMUSCULAR
  Filled 2015-10-08: qty 0.5

## 2015-10-08 NOTE — ED Provider Notes (Signed)
S- came for puncture wound to hand, abnormly dark urine      p-wound cleansed + uti vs std- treat with azithro/rocephin- + elevated bili with hgb and NO red blood cells. Patient was taking a lot of preworkout supplements. Patient has had several months of flank and back pain. +leukocytosis. D/c with cipro for UTI    Clinical Course  Value Comment By Time   25 year old male with concerning symptoms. He has elevated white count, questionable UTI versus STD. Elevated hemoglobin onto a without red blood cells. Normal CPK, he has greater than 300 protein which places him in nephrotic ranges. Patient's bilirubin is markedly elevated with slight elevation in AST. The patient's PT/INR is normal. I have consult with Dr. Wyonia HoughGerghe . The patient's labs. Have question for potential hemolysis. We have added on a hepatic panel to measure direct and indirect bilirubin, haptoglobin to look for acute hemolysis, and a porphobilinogen urine screen. I have reviewed the labs with the patient. He is resting quietly at this time.  Arthor Captainbigail Kip Kautzman, PA-C 09/12 [phone]  Indirect Bilirubin: (!) 3.6 Patient indirect bili elevated further raising concern for hemolytic anemia Arthor Captainbigail Jonnatan Hanners, PA-C 09/12 [id number]   I spoke with Dr. Pamelia HoitGudena who asks for additional labs. The patient will be seen in his office tomorrow at 2:30 PM. I have also placed a consult with nephrology. Arthor Captainbigail Lars Jeziorski, PA-C 09/12 1014   I spoke with Dr. Hyman HopesWebb of nephrology concerning the patient's proteinuria.  At this point, Dr. Hyman HopesWebb reccommends serial monitoring. I have advised the patient to follow up with a pcp and have given him a referral. I have discussed the current findings with the patient, reviewed all labs and their meanings and discussed potential diagnosis. The patient seems to understand the seriousness of his state and the need for close follow up and monitoring. The patient was counseled on the dangers of tobacco use, and was advised to quit.   Reviewed strategies to maximize success, including removing cigarettes and smoking materials from environment, stress management, substitution of other forms of reinforcement, support of family/friends and written materials. The patient appears safe for discharge at this time.   Arthor Captainbigail Hartlyn Reigel, PA-C 10/09/15 [phone]    April Palumbo, MD 10/11/15 [phone]

## 2015-10-08 NOTE — ED Notes (Signed)
Pt called from lobby, no answer. 

## 2015-10-08 NOTE — ED Notes (Signed)
Bed: [facility] Expected date:  Expected time:  Means of arrival:  Comments: 

## 2015-10-08 NOTE — ED Notes (Signed)
Patient transported to CT 

## 2015-10-08 NOTE — ED Triage Notes (Signed)
Pt got a nail stuck in his hand tonight, not sure when his last tetanus was. No bleeding at site. Pt also complains of hematuria

## 2015-10-08 NOTE — Discharge Instructions (Signed)
Follow up as we discussed. You will need your urine retested to check that levels of protein are decreasing.  Please seek immediate care if you develop the following: There is back pain.  Your symptoms are no better or worse in 3 days. There is severe back pain or lower abdominal pain.  You develop chills.  You have a fever.  There is nausea or vomiting.  There is continued burning or discomfort with urination.

## 2015-10-08 NOTE — Assessment & Plan Note (Deleted)
Unclear etiology Total bilirubin 3.8, indirect bilirubin 3.6 Blood work does not reveal any evidence of hemolysis. Hemoglobin 16, Reticulocyte count 103, direct Coombs test negative. Urinalysis: Color is red; Urine bilirubin: Large Hemoglobinuria present Elevated ketones and greater than 300 protein, nitrite positive Urine drug screen was negative  Differential diagnosis: Most common cause is hemolysis, hepatitis C, congenital disorders like Gilbert's syndrome and Crigler Najjar syndrome. Since this process has been more acute, I suspect this is related to underlying stress from urinary infection.  We will plan to repeat this in one month in follow-up.

## 2015-10-08 NOTE — Telephone Encounter (Signed)
Message routed to scheduler for lab and hospital follow up appt to be scheduled tomorrow at 2pm per Dr. Earmon PhoenixGudena's request.

## 2015-10-08 NOTE — ED Provider Notes (Signed)
[facility]-EMERGENCY DEPT Provider Note   CSN: [id number] Arrival date & time: 10/08/15  0130     History   Chief Complaint Chief Complaint  Patient presents with  . Hand Injury    HPI Richard [name] is a 25 y.o. male.  HPI   25 year old male presents today with complaints of hand injury. Patient reports he was at work when he slipped fell and landed on a nail. He reports the nail went in between his right second and third fingers, was able to pull the nail while completely. Without any significant deep space involvement. Patient has a mild laceration no active bleeding.  Patient notes that while he was waiting in the waiting room he had an episode of blood in his urine. He reports the urine was very dark red, no history of the same. Patient does note that over the last month and a half he's had intermittent periods of severe bilateral flank pain that lasted several minutes and then resolve on their own. Patient reports he's also had intermittent abdominal pain and discomfort. He denies any nausea or vomiting. Patient reports he has been sexually active for the numerous partners over the last several months. He denies any discharge from his penis, denies any dysuria, rash. Patient denies any fever or chills nausea or vomiting.   Past Medical History:  Diagnosis Date  . Sickle cell trait (HCC)     There are no active problems to display for this patient.   History reviewed. No pertinent surgical history.     Home Medications    Prior to Admission medications   Medication Sig Start Date End Date Taking? Authorizing Provider  vitamin C (ASCORBIC ACID) 500 MG tablet Take 500 mg by mouth daily.   Yes Historical Provider, MD    Family History History reviewed. No pertinent family history.  Social History Social History  Substance Use Topics  . Smoking status: Current Every Day Smoker  . Smokeless tobacco: Never Used  . Alcohol use Yes     Allergies   Review of patient's  allergies indicates no known allergies.   Review of Systems Review of Systems  All other systems reviewed and are negative.    Physical Exam Updated Vital Signs BP 121/77 (BP Location: Right Arm)   Pulse 80   Temp 98.9 F (37.2 C) (Oral)   Resp 19   Ht 6' (1.829 m)   Wt 114.8 kg   SpO2 94%   BMI 34.31 kg/m     Physical Exam  Constitutional: He is oriented to person, place, and time. He appears well-developed and well-nourished.  HENT:  Head: Normocephalic and atraumatic.  Eyes: Conjunctivae are normal. Pupils are equal, round, and reactive to light. Right eye exhibits no discharge. Left eye exhibits no discharge. No scleral icterus.  Neck: Normal range of motion. No JVD present. No tracheal deviation present.  Pulmonary/Chest: Effort normal. No stridor.  Abdominal: Soft. He exhibits no distension and no mass. There is no tenderness. There is no rebound and no guarding. No hernia.  Neurological: He is alert and oriented to person, place, and time. Coordination normal.  Psychiatric: He has a normal mood and affect. His behavior is normal. Judgment and thought content normal.  Nursing note and vitals reviewed.    ED Treatments / Results  Labs (all labs ordered are listed, but only abnormal results are displayed) Labs Reviewed  URINALYSIS, ROUTINE W REFLEX MICROSCOPIC (NOT AT [facility]) - Abnormal; Notable for the following:  Result Value   Color, Urine RED (*)    APPearance CLOUDY (*)    Hgb urine dipstick LARGE (*)    Bilirubin Urine LARGE (*)    Ketones, ur 40 (*)    Protein, ur >300 (*)    Nitrite POSITIVE (*)    Leukocytes, UA LARGE (*)    All other components within normal limits  URINE MICROSCOPIC-ADD ON - Abnormal; Notable for the following:    Bacteria, UA FEW (*)    Casts GRANULAR CAST (*)    All other components within normal limits  CBC WITH DIFFERENTIAL/PLATELET - Abnormal; Notable for the following:    WBC 13.8 (*)    Neutro Abs 8.2 (*)    Lymphs  Abs 4.1 (*)    Monocytes Absolute 1.4 (*)    All other components within normal limits  COMPREHENSIVE METABOLIC PANEL - Abnormal; Notable for the following:    AST 43 (*)    Total Bilirubin 4.8 (*)    All other components within normal limits  LIPASE, BLOOD - Abnormal; Notable for the following:    Lipase 60 (*)    All other components within normal limits  URINE CULTURE  CK  PROTIME-INR    EKG  EKG Interpretation None       Radiology Dg Hand Complete Right  Result Date: 10/08/2015 CLINICAL DATA:  Puncture wound between second and third digits. EXAM: RIGHT HAND - COMPLETE 3+ VIEW COMPARISON:  None. FINDINGS: Negative for fracture, dislocation or radiopaque foreign body. No soft tissue gas. IMPRESSION: Negative. Electronically Signed   By: Ellery Plunkaniel R Mitchell M.D.   On: 10/08/2015 02:46   Ct Renal Stone Study  Result Date: 10/08/2015 CLINICAL DATA:  Hematuria EXAM: CT ABDOMEN AND PELVIS WITHOUT CONTRAST TECHNIQUE: Multidetector CT imaging of the abdomen and pelvis was performed following the standard protocol without IV contrast. COMPARISON:  None. FINDINGS: Lower chest: No pulmonary nodules or pleural effusion. No visible pericardial effusion. Hepatobiliary: Normal noncontrast appearance of the liver. No visible biliary dilatation. Normal gallbladder. Pancreas: Normal noncontrast appearance of the pancreas. No peripancreatic fluid collection. Spleen: Normal. Adrenal glands: Normal. Urinary Tract: --Right kidney: No hydronephrosis or perinephric stranding. No nephrolithiasis. No obstructing ureteral stones. --Left kidney: No hydronephrosis or perinephric stranding. No nephrolithiasis. No obstructing ureteral stones. --Urinary bladder: Unremarkable. Stomach/Bowel: No dilated loops of bowel. No evidence of colonic or enteric inflammation. No fluid collection within the abdomen. Normal appendix. Vascular/Lymphatic: No abdominal aortic aneurysm or atherosclerotic calcification. No abdominal or  pelvic lymphadenopathy. Reproductive: Normal prostate and seminal vesicles. Musculoskeletal. No focal osseous lesion. Normal visualized extraperitoneal and extrathoracic soft tissues. IMPRESSION: 1. No obstructive uropathy. 2. No nephrolithiasis. 3. Normal noncontrast CT of the abdomen and pelvis. Electronically Signed   By: Deatra RobinsonKevin  Herman M.D.   On: 10/08/2015 06:13    Procedures Procedures (including critical care time)  Medications Ordered in ED Medications  sterile water (preservative free) injection (not administered)  Tdap (BOOSTRIX) injection 0.5 mL (0.5 mLs Intramuscular Given 10/08/15 0524)  azithromycin (ZITHROMAX) tablet 1,000 mg (1,000 mg Oral Given 10/08/15 0627)  cefTRIAXone (ROCEPHIN) injection 250 mg (250 mg Intramuscular Given 10/08/15 [id number])     Initial Impression / Assessment and Plan / ED Course  I have reviewed the triage vital signs and the nursing notes.  Pertinent labs & imaging results that were available during my care of the patient were reviewed by me and considered in my medical decision making (see chart for details).  Clinical Course    Final  Clinical Impressions(s) / ED Diagnoses   Final diagnoses:  Puncture wound   Labs:  Imaging:  Consults:  Therapeutics:  Discharge Meds:   Assessment/Plan:  25 year old male presents today with numerous complaints. Patient has a puncture wound to his hand. While here he developed bloody urine. Patient has no signs of STD, but does have exposure. Patient was noted to have elevated liver [place] in the ED. Normal CT scan. Patient care transferred to oncoming provider pending further laboratory analysis disposition.        New Prescriptions New Prescriptions   No medications on file     Eyvonne Mechanic, PA-C 10/08/15 1610    April Palumbo, MD 10/08/15 0630

## 2015-10-09 ENCOUNTER — Other Ambulatory Visit: Payer: BLUE CROSS/BLUE SHIELD

## 2015-10-09 ENCOUNTER — Telehealth: Payer: Self-pay | Admitting: Hematology and Oncology

## 2015-10-09 ENCOUNTER — Inpatient Hospital Stay: Payer: BLUE CROSS/BLUE SHIELD | Admitting: Hematology and Oncology

## 2015-10-09 LAB — URINE CULTURE: Culture: NO GROWTH

## 2015-10-09 LAB — HIV ANTIBODY (ROUTINE TESTING W REFLEX): HIV Screen 4th Generation wRfx: NONREACTIVE

## 2015-10-09 LAB — HAPTOGLOBIN: Haptoglobin: 15 mg/dL — ABNORMAL LOW (ref 34–200)

## 2015-10-09 LAB — RPR: RPR: NONREACTIVE

## 2015-10-09 NOTE — Telephone Encounter (Signed)
10/09/2015 Appointment rescheduled to 10/11/2015 per patient request. Per patient availability on Friday

## 2015-10-11 ENCOUNTER — Telehealth: Payer: Self-pay | Admitting: Hematology and Oncology

## 2015-10-11 ENCOUNTER — Other Ambulatory Visit: Payer: BLUE CROSS/BLUE SHIELD

## 2015-10-11 ENCOUNTER — Inpatient Hospital Stay: Payer: BLUE CROSS/BLUE SHIELD | Admitting: Hematology and Oncology

## 2015-10-11 NOTE — Telephone Encounter (Signed)
Patient called to reschedule 09/15 appointment per having to travel for work. Appointment rescheduled to 09/21 at 3:15PM

## 2015-10-14 LAB — PORPHOBILINOGEN, RANDOM URINE: Quantitative Porphobilinogen: 1 mg/L (ref 0.0–2.0)

## 2015-10-17 ENCOUNTER — Telehealth: Payer: Self-pay | Admitting: Hematology and Oncology

## 2015-10-17 ENCOUNTER — Other Ambulatory Visit: Payer: BLUE CROSS/BLUE SHIELD

## 2015-10-17 ENCOUNTER — Inpatient Hospital Stay: Payer: BLUE CROSS/BLUE SHIELD | Admitting: Hematology and Oncology

## 2015-10-17 NOTE — Assessment & Plan Note (Deleted)
Unclear etiology Total bilirubin 3.8, indirect bilirubin 3.6 Blood work does not reveal any evidence of hemolysis. Hemoglobin 16, Reticulocyte count 103 (2.3%), direct Coombs test negative. Urinalysis: Color is red; Urine bilirubin: Large Hemoglobinuria present Elevated ketones and greater than 300 protein, nitrite positive Urine drug screen was negative Quantitative urine porphobilinogen 1.0, normal RPR: Nonreactive HIV negative  Differential diagnosis: Most common cause is hemolysis, hepatitis C, congenital disorders like Gilbert's syndrome and Crigler Najjar syndrome. Since this process has been more acute, I suspect this is related to underlying stress from urinary infection.  We will plan to repeat this in one month in follow-up.

## 2015-10-17 NOTE — Telephone Encounter (Signed)
09/21 Appointments rescheduled per patient request to 09/25.

## 2015-10-21 ENCOUNTER — Inpatient Hospital Stay: Payer: BLUE CROSS/BLUE SHIELD | Admitting: Hematology and Oncology

## 2015-10-21 ENCOUNTER — Other Ambulatory Visit: Payer: BLUE CROSS/BLUE SHIELD

## 2015-10-24 LAB — PORPHYRINS, FRACTIONATION-PLASMA
Coproporphyrin.: <1 ug/dL (ref 0.0–1.0)
Pentacarboxyl Porphyrins: <1 ug/dL (ref 0.0–1.0)
Protoporphyrin: <1 ug/dL (ref 0.0–1.0)
Uroporphyrin: <1 ug/dL (ref 0.0–1.0)

## 2017-02-12 ENCOUNTER — Emergency Department (HOSPITAL_COMMUNITY): Payer: Managed Care, Other (non HMO)

## 2017-02-12 ENCOUNTER — Encounter: Payer: Self-pay | Admitting: Hematology

## 2017-02-12 ENCOUNTER — Inpatient Hospital Stay (HOSPITAL_COMMUNITY)
Admission: EM | Admit: 2017-02-12 | Discharge: 2017-02-18 | DRG: 546 | Disposition: A | Discharge status: Home / Self Care | Payer: Managed Care, Other (non HMO) | Attending: Internal Medicine | Admitting: Internal Medicine

## 2017-02-12 ENCOUNTER — Inpatient Hospital Stay (HOSPITAL_COMMUNITY): Payer: Managed Care, Other (non HMO)

## 2017-02-12 ENCOUNTER — Other Ambulatory Visit: Payer: Self-pay

## 2017-02-12 ENCOUNTER — Encounter (HOSPITAL_COMMUNITY): Payer: Self-pay | Admitting: Emergency Medicine

## 2017-02-12 DIAGNOSIS — Z8249 Family history of ischemic heart disease and other diseases of the circulatory system: Secondary | ICD-10-CM

## 2017-02-12 DIAGNOSIS — Z6838 Body mass index (BMI) 38.0-38.9, adult: Secondary | ICD-10-CM

## 2017-02-12 DIAGNOSIS — D571 Sickle-cell disease without crisis: Secondary | ICD-10-CM | POA: Diagnosis present

## 2017-02-12 DIAGNOSIS — J069 Acute upper respiratory infection, unspecified: Secondary | ICD-10-CM | POA: Diagnosis not present

## 2017-02-12 DIAGNOSIS — E669 Obesity, unspecified: Secondary | ICD-10-CM | POA: Diagnosis present

## 2017-02-12 DIAGNOSIS — M311 Thrombotic microangiopathy: Secondary | ICD-10-CM | POA: Diagnosis present

## 2017-02-12 DIAGNOSIS — I10 Essential (primary) hypertension: Secondary | ICD-10-CM | POA: Diagnosis present

## 2017-02-12 DIAGNOSIS — Z452 Encounter for adjustment and management of vascular access device: Secondary | ICD-10-CM

## 2017-02-12 DIAGNOSIS — E876 Hypokalemia: Secondary | ICD-10-CM | POA: Diagnosis not present

## 2017-02-12 DIAGNOSIS — M3119 Other thrombotic microangiopathy: Secondary | ICD-10-CM | POA: Diagnosis present

## 2017-02-12 DIAGNOSIS — D593 Hemolytic-uremic syndrome: Secondary | ICD-10-CM | POA: Diagnosis not present

## 2017-02-12 DIAGNOSIS — D599 Acquired hemolytic anemia, unspecified: Secondary | ICD-10-CM | POA: Diagnosis not present

## 2017-02-12 DIAGNOSIS — D5700 Hb-SS disease with crisis, unspecified: Secondary | ICD-10-CM | POA: Diagnosis not present

## 2017-02-12 DIAGNOSIS — G8191 Hemiplegia, unspecified affecting right dominant side: Secondary | ICD-10-CM | POA: Insufficient documentation

## 2017-02-12 DIAGNOSIS — D594 Other nonautoimmune hemolytic anemias: Secondary | ICD-10-CM | POA: Insufficient documentation

## 2017-02-12 DIAGNOSIS — D696 Thrombocytopenia, unspecified: Secondary | ICD-10-CM | POA: Insufficient documentation

## 2017-02-12 DIAGNOSIS — I361 Nonrheumatic tricuspid (valve) insufficiency: Secondary | ICD-10-CM | POA: Diagnosis not present

## 2017-02-12 DIAGNOSIS — F17210 Nicotine dependence, cigarettes, uncomplicated: Secondary | ICD-10-CM | POA: Diagnosis present

## 2017-02-12 DIAGNOSIS — R197 Diarrhea, unspecified: Secondary | ICD-10-CM

## 2017-02-12 DIAGNOSIS — R748 Abnormal levels of other serum enzymes: Secondary | ICD-10-CM | POA: Diagnosis present

## 2017-02-12 LAB — CBC WITH DIFFERENTIAL/PLATELET
BASOS ABS: 0 10*3/uL (ref 0.0–0.1)
Basophils Relative: 0 %
Eosinophils Absolute: 0 10*3/uL (ref 0.0–0.7)
Eosinophils Relative: 0 %
HEMATOCRIT: 17.2 % — AB (ref 39.0–52.0)
Hemoglobin: 5.8 g/dL — CL (ref 13.0–17.0)
LYMPHS PCT: 23 %
Lymphs Abs: 5.1 10*3/uL — ABNORMAL HIGH (ref 0.7–4.0)
MCH: 32.2 pg (ref 26.0–34.0)
MCHC: 33.7 g/dL (ref 30.0–36.0)
MCV: 95.6 fL (ref 78.0–100.0)
MONO ABS: 0.7 10*3/uL (ref 0.1–1.0)
MONOS PCT: 3 %
MYELOCYTES: 2 %
NRBC: 26 /100{WBCs} — AB
Neutro Abs: 16.2 10*3/uL — ABNORMAL HIGH (ref 1.7–7.7)
Neutrophils Relative %: 72 %
Platelets: 16 10*3/uL — CL (ref 150–400)
RBC: 1.8 MIL/uL — AB (ref 4.22–5.81)
RDW: 20.7 % — AB (ref 11.5–15.5)
WBC: 22 10*3/uL — AB (ref 4.0–10.5)

## 2017-02-12 LAB — URINALYSIS, ROUTINE W REFLEX MICROSCOPIC
Bacteria, UA: NONE SEEN
Bilirubin Urine: NEGATIVE
Glucose, UA: NEGATIVE mg/dL
Ketones, ur: NEGATIVE mg/dL
Leukocytes, UA: NEGATIVE
Nitrite: NEGATIVE
Protein, ur: NEGATIVE mg/dL
Specific Gravity, Urine: 1.024 (ref 1.005–1.030)
pH: 6 (ref 5.0–8.0)

## 2017-02-12 LAB — IRON AND TIBC
Iron: 209 ug/dL — ABNORMAL HIGH (ref 45–182)
SATURATION RATIOS: 69 % — AB (ref 17.9–39.5)
TIBC: 304 ug/dL (ref 250–450)
UIBC: 95 ug/dL

## 2017-02-12 LAB — COMPREHENSIVE METABOLIC PANEL
ALBUMIN: 3.7 g/dL (ref 3.5–5.0)
ALT: 64 U/L — ABNORMAL HIGH (ref 17–63)
AST: 61 U/L — AB (ref 15–41)
Alkaline Phosphatase: 78 U/L (ref 38–126)
Anion gap: 7 (ref 5–15)
BUN: 13 mg/dL (ref 6–20)
CHLORIDE: 105 mmol/L (ref 101–111)
CO2: 24 mmol/L (ref 22–32)
CREATININE: 1.09 mg/dL (ref 0.61–1.24)
Calcium: 8.6 mg/dL — ABNORMAL LOW (ref 8.9–10.3)
GFR calc non Af Amer: >60 mL/min (ref >60)
Glucose, Bld: 158 mg/dL — ABNORMAL HIGH (ref 65–99)
Potassium: 3.7 mmol/L (ref 3.5–5.1)
SODIUM: 136 mmol/L (ref 135–145)
Total Bilirubin: 2.7 mg/dL — ABNORMAL HIGH (ref 0.3–1.2)
Total Protein: 7 g/dL (ref 6.5–8.1)

## 2017-02-12 LAB — CBC
HCT: 23.5 % — ABNORMAL LOW (ref 39.0–52.0)
Hemoglobin: 7.7 g/dL — ABNORMAL LOW (ref 13.0–17.0)
MCH: 30.2 pg (ref 26.0–34.0)
MCHC: 32.8 g/dL (ref 30.0–36.0)
MCV: 92.2 fL (ref 78.0–100.0)
Platelets: 18 10*3/uL — CL (ref 150–400)
RBC: 2.55 MIL/uL — ABNORMAL LOW (ref 4.22–5.81)
RDW: 17.8 % — ABNORMAL HIGH (ref 11.5–15.5)
WBC: 20.8 10*3/uL — ABNORMAL HIGH (ref 4.0–10.5)

## 2017-02-12 LAB — I-STAT CHEM 8, ED
BUN: 11 mg/dL (ref 6–20)
CREATININE: 1.1 mg/dL (ref 0.61–1.24)
Calcium, Ion: 1.15 mmol/L (ref 1.15–1.40)
Chloride: 102 mmol/L (ref 101–111)
Glucose, Bld: 139 mg/dL — ABNORMAL HIGH (ref 65–99)
HCT: 16 % — ABNORMAL LOW (ref 39.0–52.0)
Hemoglobin: 5.4 g/dL — CL (ref 13.0–17.0)
Potassium: 4 mmol/L (ref 3.5–5.1)
Sodium: 138 mmol/L (ref 135–145)
TCO2: 23 mmol/L (ref 22–32)

## 2017-02-12 LAB — FOLATE: Folate: 12.3 ng/mL (ref >5.9)

## 2017-02-12 LAB — RETICULOCYTES
RBC.: 1.79 MIL/uL — AB (ref 4.22–5.81)
RETIC CT PCT: 19.1 % — AB (ref 0.4–3.1)
Retic Count, Absolute: 341.9 10*3/uL — ABNORMAL HIGH (ref 19.0–186.0)

## 2017-02-12 LAB — FIBRINOGEN: Fibrinogen: 421 mg/dL (ref 210–475)

## 2017-02-12 LAB — VITAMIN B12: Vitamin B-12: 317 pg/mL (ref 180–914)

## 2017-02-12 LAB — ABO/RH: ABO/RH(D): O POS

## 2017-02-12 LAB — LACTATE DEHYDROGENASE: LDH: 1378 U/L — ABNORMAL HIGH (ref 98–192)

## 2017-02-12 LAB — PROTIME-INR
INR: 1.09
Prothrombin Time: 14 seconds (ref 11.4–15.2)

## 2017-02-12 LAB — TROPONIN I: Troponin I: 0.38 ng/mL (ref <0.03)

## 2017-02-12 LAB — DIRECT ANTIGLOBULIN TEST (NOT AT ARMC)
DAT, IgG: NEGATIVE
DAT, complement: NEGATIVE

## 2017-02-12 LAB — SAVE SMEAR

## 2017-02-12 LAB — I-STAT TROPONIN, ED: Troponin i, poc: 0.38 ng/mL (ref 0.00–0.08)

## 2017-02-12 LAB — FERRITIN: Ferritin: 1230 ng/mL — ABNORMAL HIGH (ref 24–336)

## 2017-02-12 LAB — D-DIMER, QUANTITATIVE: D-Dimer, Quant: 14.57 ug/mL-FEU — ABNORMAL HIGH (ref 0.00–0.50)

## 2017-02-12 LAB — PREPARE RBC (CROSSMATCH)

## 2017-02-12 MED ORDER — SODIUM CHLORIDE 0.9 % IV SOLN
Freq: Once | INTRAVENOUS | Status: AC
  Administered 2017-02-12: 17:00:00 via INTRAVENOUS

## 2017-02-12 MED ORDER — ANTICOAGULANT SODIUM CITRATE 4% (200MG/5ML) IV SOLN: 5.0000 mL | Freq: Once | Status: DC

## 2017-02-12 MED ORDER — ACD FORMULA A 0.73-2.45-2.2 GM/100ML VI SOLN
500.0000 mL | Status: DC
  Filled 2017-02-12: qty 500

## 2017-02-12 MED ORDER — FOLIC ACID 1 MG PO TABS
2.0000 mg | ORAL_TABLET | Freq: Every day | ORAL | Status: DC
  Administered 2017-02-13 – 2017-02-18 (×6): 2 mg via ORAL
  Filled 2017-02-12 (×7): qty 2

## 2017-02-12 MED ORDER — ACETAMINOPHEN 325 MG PO TABS: 650.0000 mg | ORAL_TABLET | ORAL | Status: DC | PRN

## 2017-02-12 MED ORDER — ASPIRIN 81 MG PO CHEW: 324.0000 mg | CHEWABLE_TABLET | ORAL | Status: DC

## 2017-02-12 MED ORDER — LORAZEPAM 1 MG PO TABS
1.0000 mg | ORAL_TABLET | Freq: Once | ORAL | Status: AC
  Administered 2017-02-12: 1 mg via ORAL
  Filled 2017-02-12: qty 1

## 2017-02-12 MED ORDER — ACD FORMULA A 0.73-2.45-2.2 GM/100ML VI SOLN: 500.0000 mL | Status: DC

## 2017-02-12 MED ORDER — SODIUM CHLORIDE 0.9 % IV BOLUS (SEPSIS)
1000.0000 mL | Freq: Once | INTRAVENOUS | Status: AC
  Administered 2017-02-12: 1000 mL via INTRAVENOUS

## 2017-02-12 MED ORDER — CALCIUM CARBONATE ANTACID 500 MG PO CHEW: 2.0000 | CHEWABLE_TABLET | ORAL | Status: DC

## 2017-02-12 MED ORDER — ANTICOAGULANT SODIUM CITRATE 4% (200MG/5ML) IV SOLN
5.0000 mL | Freq: Once | Status: DC
  Filled 2017-02-12: qty 250

## 2017-02-12 MED ORDER — SODIUM CHLORIDE 0.9 % IV SOLN
250.0000 mg | Freq: Once | INTRAVENOUS | Status: AC
  Administered 2017-02-12: 250 mg via INTRAVENOUS
  Filled 2017-02-12: qty 2

## 2017-02-12 MED ORDER — PANTOPRAZOLE SODIUM 40 MG PO TBEC
40.0000 mg | DELAYED_RELEASE_TABLET | Freq: Every day | ORAL | Status: DC
  Administered 2017-02-13 – 2017-02-18 (×6): 40 mg via ORAL
  Filled 2017-02-12 (×6): qty 1

## 2017-02-12 MED ORDER — DIPHENHYDRAMINE HCL 50 MG/ML IJ SOLN
25.0000 mg | Freq: Once | INTRAMUSCULAR | Status: AC
  Administered 2017-02-12: 25 mg via INTRAVENOUS
  Filled 2017-02-12: qty 1

## 2017-02-12 MED ORDER — IOPAMIDOL (ISOVUE-370) INJECTION 76%
INTRAVENOUS | Status: AC
  Filled 2017-02-12: qty 100

## 2017-02-12 MED ORDER — SODIUM CHLORIDE 0.9 % IV SOLN: 250.0000 mL | INTRAVENOUS | Status: DC | PRN

## 2017-02-12 MED ORDER — ACETAMINOPHEN 325 MG PO TABS
650.0000 mg | ORAL_TABLET | Freq: Once | ORAL | Status: AC
  Administered 2017-02-12: 650 mg via ORAL
  Filled 2017-02-12: qty 2

## 2017-02-12 MED ORDER — B COMPLEX-C PO TABS
1.0000 | ORAL_TABLET | Freq: Every day | ORAL | Status: DC
  Administered 2017-02-13 – 2017-02-18 (×6): 1 via ORAL
  Filled 2017-02-12 (×7): qty 1

## 2017-02-12 MED ORDER — SODIUM CHLORIDE 0.9 % IV SOLN
2.0000 g | Freq: Once | INTRAVENOUS | Status: DC
  Filled 2017-02-12: qty 20

## 2017-02-12 MED ORDER — DIPHENHYDRAMINE HCL 25 MG PO CAPS: 25.0000 mg | ORAL_CAPSULE | Freq: Four times a day (QID) | ORAL | Status: DC | PRN

## 2017-02-12 MED ORDER — KETOROLAC TROMETHAMINE 30 MG/ML IJ SOLN
30.0000 mg | Freq: Once | INTRAMUSCULAR | Status: DC
  Filled 2017-02-12: qty 1

## 2017-02-12 MED ORDER — METOCLOPRAMIDE HCL 5 MG/ML IJ SOLN
10.0000 mg | Freq: Once | INTRAMUSCULAR | Status: AC
  Administered 2017-02-12: 10 mg via INTRAVENOUS
  Filled 2017-02-12: qty 2

## 2017-02-12 MED ORDER — IOPAMIDOL (ISOVUE-370) INJECTION 76%
100.0000 mL | Freq: Once | INTRAVENOUS | Status: AC | PRN
  Administered 2017-02-12: 100 mL via INTRAVENOUS

## 2017-02-12 MED ORDER — METHYLPREDNISOLONE SODIUM SUCC 1000 MG IJ SOLR
250.0000 mg | Freq: Four times a day (QID) | INTRAMUSCULAR | Status: AC
  Administered 2017-02-13 – 2017-02-15 (×11): 250 mg via INTRAVENOUS
  Filled 2017-02-12 (×12): qty 2

## 2017-02-12 MED ORDER — CALCIUM GLUCONATE 10 % IV SOLN: 2.0000 g | Freq: Once | INTRAVENOUS | Status: DC

## 2017-02-12 MED ORDER — SODIUM CHLORIDE 0.9 % IV SOLN
2.0000 g | Freq: Once | INTRAVENOUS | Status: DC
  Administered 2017-02-12: 2 g via INTRAVENOUS
  Filled 2017-02-12: qty 20

## 2017-02-12 MED ORDER — ACD FORMULA A 0.73-2.45-2.2 GM/100ML VI SOLN
Status: AC
  Administered 2017-02-12: 22:00:00
  Filled 2017-02-12: qty 1000

## 2017-02-12 MED ORDER — ASPIRIN 300 MG RE SUPP: 300.0000 mg | RECTAL | Status: DC

## 2017-02-12 MED ORDER — SODIUM CHLORIDE 0.9 % IV SOLN
250.0000 mL | INTRAVENOUS | Status: DC | PRN
  Administered 2017-02-12: 250 mL via INTRAVENOUS

## 2017-02-12 NOTE — Progress Notes (Signed)
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 male who has been referred to [place] by Fayrene Helper, PA-C in the Emergency Department for evaluation and management of new onset severe anemia and thrombocytopenia.  Patient has received Benadryl and is currently too sleepy to provide much information. Most of the information was obtained from the ED physician and confirmed with the fiance at bedside and key elements were confirmed with patient.  Patient fiancee noted that the patient had recurrent flu-like sx recently and he was treated with amoxcillin.   Prior to, the patient was evaluated at [facility] 2 weeks ago for flu-like symptoms and he tested negative for flu and strep and was prescribed theraflu. [name] notes that since then the patient symptoms progressively worsened. Patient was evaluated in the ED with a HA on yesterday and his CT Head showed no abnormalities. Per the Provider in the ED, patient was noted to have an abnormal gait favoring his left leg along with mild decreased sensation and minimal pronator drift. [name] also notes that the patient had progressive SOB x [redacted] week along with right sided numbness that she noticed last night along with slurred speech.   in the ED he was noted to have significant anemia with hemoglobin of 5.8 and significant thrombocytopenia with a platelet count of 16k.  Discussed with patient's fiancee regarding the patient's labwork today (02/12/17) of it is as follows: CMP with Cr at 1.09, AST at 61, ALT at 64, and total bilirubin at 2.7. I-stat-Chem 8 with hgb at 5.4, HCT at 16.0. CBC with WBC at 22, RBC at 1.8, and PLT at 16k. LDH at 1,378 today. Vitamin B12, folate, Iron studies, and ferritin,  haptoglobin, AdamTS13 , HIV tests pending today.   [name] denies the patient having any other PMHx, surgeries, or allergies to medications. She also denies the patient obtaining recent vaccinations. [name] notes that the patient has an inguinal hernia that is reduced at this time. She denies the patient ever obtaining a blood transfusion or having a prior hx of blood disorders.   She notes that the patient currently smokes about 0.5 PPD or cigarettes and that he has previously used marijuana twice within the last year. She denies any other illegal drug use. She denies that patient having a PCP as he is typically healthy and he doesn't get sick. 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    On review of systems, fiancee reports that the patient had progressive SOB.  Notes some diarrhea, blood on the tissue upon wiping, and dark brown urine. He denies abdominal pain, dysuria, nasal bleeding, gum bleeding, bruising, flank pain, known exposure to HIV and any other symptoms.   *Please note that the patient had been given benadryl for a HA prior to my arrival and was asleep, thus patient HPI was given by his fiancee [name]. Patient intermittently woke up to answer questions when probed.*   MEDICAL HISTORY:  Past Medical History:  Diagnosis Date  . Sickle cell trait (HCC)     SURGICAL HISTORY: History reviewed. No pertinent surgical history.  SOCIAL HISTORY: Social History   Socioeconomic History  . Marital status: Single    Spouse name: Not on file  . Number of children: Not on  file  . Years of education: Not on file  . Highest education level: Not on file  Social Needs  . Financial resource strain: Not on file  . Food insecurity - worry: Not on file  . Food insecurity - inability: Not on file  . Transportation needs - medical: Not on file  . Transportation needs - non-medical: Not on file  Occupational History  . Not on file  Tobacco Use  . Smoking status: Current Every Day  Smoker  . Smokeless tobacco: Never Used  Substance and Sexual Activity  . Alcohol use: Yes    Comment: occ  . Drug use: No  . Sexual activity: Not on file  Other Topics Concern  . Not on file  Social History Narrative  . Not on file    FAMILY HISTORY: Family History  Problem Relation Age of Onset  . Cancer Other   . Diabetes Other   . Hypertension Other     ALLERGIES:  has No Known Allergies.  MEDICATIONS:  Current Facility-Administered Medications  Medication Dose Route Frequency Provider Last Rate Last Dose  . 0.9 %  sodium chloride infusion   Intravenous Once Fayrene Helperran, [name], PA-C      . iopamidol (ISOVUE-370) 76 % injection           . methylPREDNISolone sodium succinate (SOLU-MEDROL) 250 mg in sodium chloride 0.9 % 50 mL IVPB  250 mg Intravenous Once Fayrene Helperran, [name], PA-C       Current Outpatient Medications  Medication Sig Dispense Refill  . ciprofloxacin (CIPRO) 500 MG tablet Take 1 tablet (500 mg total) by mouth 2 (two) times daily. (Patient not taking: Reported on 02/12/2017) 20 tablet 0    REVIEW OF SYSTEMS:    10 Point review of Systems was done is negative except as noted above.  PHYSICAL EXAMINATION:  . Vitals:   02/12/17 1235 02/12/17 1521  BP:  104/81  Pulse:  (!) 112  Resp:  (!) 28  Temp: 99.3 F (37.4 C)   SpO2:  97%   Filed Weights   02/12/17 0127  Weight: 273 lb (123.8 kg)   .Body mass index is 38.08 kg/m.  GENERAL: sleepy but arousable, in no acute distress. SKIN: no acute rashes, no significant lesions EYES: conjunctiva are pink and non-injected, sclera anicteric. PERRL OROPHARYNX: MMM, no exudates, no oropharyngeal erythema or ulceration NECK: supple, no JVD LYMPH:  no palpable lymphadenopathy in the cervical, axillary or inguinal regions LUNGS: clear to auscultation b/l with normal respiratory effort HEART: regular rate & rhythm ABDOMEN:  normoactive bowel sounds , non tender, not distended. Extremity: no pedal edema PSYCH: sleepy but  easily arousable, oriented x 3 with fluent speech NEURO: moving all 4 extremities . Unable to perform detailed neurological examination since patient was too sleepy to apropriately co-operate. (per ED PA - minimal rt sided weakness). LABORATORY DATA:  I have reviewed the data as listed  . CBC Latest Ref Rng & Units 02/12/2017 02/12/2017 10/08/2015  WBC 4.0 - 10.5 K/uL - 22.0(H) 13.8(H)  Hemoglobin 13.0 - 17.0 g/dL 1.6(XW5.4(LL) 5.8(LL) 14.6  Hematocrit 39.0 - 52.0 % 16.0(L) 17.2(L) 42.5  Platelets 150 - 400 K/uL - 16(LL) 227    . CMP Latest Ref Rng & Units 02/12/2017 02/12/2017 10/08/2015  Glucose 65 - 99 mg/dL 960(A158(H) 540(J139(H) -  BUN 6 - 20 mg/dL 13 11 -  Creatinine 8.110.61 - 1.24 mg/dL 9.141.09 7.821.10 -  Sodium 956135 - 145 mmol/L 136 138 -  Potassium 3.5 - 5.1 mmol/L  3.7 4.0 -  Chloride 101 - 111 mmol/L 105 102 -  CO2 22 - 32 mmol/L 24 - -  Calcium 8.9 - 10.3 mg/dL 1.6(X) - -  Total Protein 6.5 - 8.1 g/dL 7.0 - 7.1  Total Bilirubin 0.3 - 1.2 mg/dL 2.7(H) - 3.8(H)  Alkaline Phos 38 - 126 U/L 78 - 63  AST 15 - 41 U/L 61(H) - 43(H)  ALT 17 - 63 U/L 64(H) - 22   Component     Latest Ref Rng & Units 02/12/2017  WBC     4.0 - 10.5 K/uL 22.0 (H)  RBC     4.22 - 5.81 MIL/uL 1.80 (L)  Hemoglobin     13.0 - 17.0 g/dL 5.8 (LL)  HCT     09.6 - 52.0 % 17.2 (L)  MCV     78.0 - 100.0 fL 95.6  MCH     26.0 - 34.0 pg 32.2  MCHC     30.0 - 36.0 g/dL 04.5  RDW     40.9 - 81.1 % 20.7 (H)  Platelets     150 - 400 K/uL 16 (LL)  Neutrophils     % 72  Lymphocytes     % 23  Monocytes Relative     % 3  Eosinophil     % 0  Basophil     % 0  Myelocytes     % 2  nRBC     0 /100 WBC 26 (H)  NEUT#     1.7 - 7.7 K/uL 16.2 (H)  Lymphocyte #     0.7 - 4.0 K/uL 5.1 (H)  Monocyte #     0.1 - 1.0 K/uL 0.7  Eosinophils Absolute     0.0 - 0.7 K/uL 0.0  Basophils Absolute     0.0 - 0.1 K/uL 0.0  RBC Morphology      POLYCHROMASIA PRESENT  WBC Morphology      MILD LEFT SHIFT (1-5% METAS, OCC MYELO, OCC  BANDS)  Sodium     135 - 145 mmol/L 136  Potassium     3.5 - 5.1 mmol/L 3.7  Chloride     101 - 111 mmol/L 105  CO2     22 - 32 mmol/L 24  Glucose     65 - 99 mg/dL 914 (H)  BUN     6 - 20 mg/dL 13  Creatinine     7.82 - 1.24 mg/dL 9.56  Calcium     8.9 - 10.3 mg/dL 8.6 (L)  Total Protein     6.5 - 8.1 g/dL 7.0  Albumin     3.5 - 5.0 g/dL 3.7  AST     15 - 41 U/L 61 (H)  ALT     17 - 63 U/L 64 (H)  Alkaline Phosphatase     38 - 126 U/L 78  Total Bilirubin     0.3 - 1.2 mg/dL 2.7 (H)  GFR, Est Non African American     >60 mL/min >60  GFR, Est African American     >60 mL/min >60  Anion gap     5 - 15 7  Retic Ct Pct     0.4 - 3.1 % 19.1 (H)  RBC.     4.22 - 5.81 MIL/uL 1.79 (L)  Retic Count, Absolute     19.0 - 186.0 K/uL 341.9 (H)  Prothrombin Time     11.4 - 15.2 seconds 14.0  INR  1.09  DAT, complement      NEG  DAT, IgG      NEG  D-Dimer, Quant     0.00 - 0.50 ug/mL-FEU 14.57 (H)  LDH     98 - 192 U/L 1,378 (H)  Fibrinogen     210 - 475 mg/dL 161   RADIOGRAPHIC STUDIES: I have personally reviewed the radiological images as listed and agreed with the findings in the report. Dg Chest 2 View  Result Date: 02/12/2017 CLINICAL DATA:  Shortness of Breath EXAM: CHEST  2 VIEW COMPARISON:  None. FINDINGS: Lungs are clear. Heart size and pulmonary vascularity are normal. No adenopathy. No bone lesions. IMPRESSION: No edema or consolidation. Electronically Signed   By: Bretta Bang III M.D.   On: 02/12/2017 14:23   Ct Head Wo Contrast  Result Date: 02/12/2017 CLINICAL DATA:  Right-sided numbness. History of sickle cell disease EXAM: CT HEAD WITHOUT CONTRAST TECHNIQUE: Contiguous axial images were obtained from the base of the skull through the vertex without intravenous contrast. COMPARISON:  None. FINDINGS: Brain: The ventricles are normal in size and configuration. There is no intracranial mass, hemorrhage, extra-axial fluid collection, or midline  shift. Gray-white compartments are normal. No acute infarct evident. Vascular: No hyperdense vessels.  No vascular calcification evident. Skull: The bony calvarium appears intact. Sinuses/Orbits: Visualized paranasal sinuses are clear. Orbits appear symmetric bilaterally. Other: Mastoid air cells are clear. IMPRESSION: Study within normal limits. Electronically Signed   By: Bretta Bang III M.D.   On: 02/12/2017 07:34   Mr Brain Wo Contrast  Result Date: 02/12/2017 CLINICAL DATA:  Severe headache while at work last night. RIGHT-sided numbness. Ataxia, suspect stroke. History of sickle cell trait. EXAM: MRI HEAD WITHOUT CONTRAST TECHNIQUE: Axial and coronal diffusion weighted imaging, sagittal T1, axial T2 and axial T2 FLAIR obtained on a 1.5 tesla scanner. Patient was unable to tolerate further imaging. COMPARISON:  CT HEAD February 13, 2016 at 0709 hours. FINDINGS: Sequences are mildly motion degraded. BRAIN: No reduced diffusion to suggest acute ischemia, hyperacute demyelination or hypercellular tumor. No field inhomogeneity to suggest blood products though susceptibility weighted sequence not obtained. No intrinsic T1 shortening. He ventricles and sulci are normal for patient's age. No suspicious parenchymal signal, mass or mass effect. No abnormal extra-axial fluid collections. VASCULAR: Normal major vascular flow voids present at skull base. SKULL AND UPPER CERVICAL SPINE: Mild prevertebral soft tissue fullness. No abnormal sellar expansion. No suspicious calvarial bone marrow signal. Craniocervical junction maintained. SINUSES/ORBITS: Included paranasal sinuses are well aerated. Imaged mastoid air cells are well aerated. The included ocular globes and orbital contents are non-suspicious. OTHER: None. IMPRESSION: 1. Limited motion degraded MRI, patient was unable to tolerate complete examination. 2. Negative noncontrast MRI of the head. 3. Prevertebral fullness, seen with recent viral illness or  immunocompromised states. Electronically Signed   By: Awilda Metro M.D.   On: 02/12/2017 13:49    ASSESSMENT & PLAN:   27 y.o. male with hx of sickle cell trait  1. 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 Hemoglobin is down to 5.8 with elevated LDH of 1300s. 2. 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  Platelets down to 16k With no overt bleeding noted at this time. 3. Left sided mild weakness/numbness No overt CVA at this time an MRI.  Patient's clinical presentation anemia and thrombocytopenia along with peripheral blood smear showing significant increased schistocytes and with left sided neurological symptoms is consistent with a TTP-HUS spectrum disorder. No reported fevers.  Patient is sleepy at this time and mental status is unclear.  This was attributed to Benadryl that he received. No significant renal failure at this time.  Patient had a recent upper respiratory infection and diarrhea which could be potential trigger events. Given lack of renal involvement this would be more consistent with a TTP-like picture.  Plan -advised with pt fiancee [name] and patient regarding concern for new diagnosed ,newly onset TTP-HUSand other associated labs , natural history, prognosis, treatment options and recommended treatment. - -Ordered stat ADAMTS 13 level.  To be sent on initial admission labs or repeat labs prior to plasma or blood products. --Patient will be admitted to [facility] for plasmapheresis, where he will be further managed there. To the critical care unit . D/W DR Ardeth Perfect. He will place/arrange hemodialysis catheter to be placed. -Plasmapheresis orders have been placed -I discussed with  Dr. Darrick Penna /Nephrology - appreciate his help in having plasma exchange done starting this evening. -daily plasma exchange till LDH and platelets normalize. -Will treat the patient with IV solu-medrol 250 mg q 6 x 12 doses with GI prophylaxis - PRBC transfusion  prn to maintain Hgb >7.5 -would hold off on Platelet transfusions unless patient actively bleeding. - folic acid 2 mg, B12 1000 mcg daily -HIV test ordered today -Hgb electrophoresis  -daily cbc with diff and reticulocyte counts, LDH, cmp  4. URI symptoms  -Viral respiratory panel -Droplet precautions  5. Diarrhea -If persistent would get C. difficile and stool studies. -Patient recently was treated with antibiotics for URI.  Hematology will continue to follow daily Aprpeciate help from ED, critical and nephrology.   -Advised the patient fiancee that the patient will be followed in [facility] following being discharged from the hospital -Patient [name], [name] can be reached at [phone]  All of the patients questions were answered with apparent satisfaction. The patient knows to call the clinic with any problems, questions or concerns.  I spent 70 minutes counseling the patient face to face. The total time spent in the appointment was 80 minutes and more than 50% was on counseling and direct patient cares.    Wyvonnia Lora MD MS AAHIVMS [facility] Hematology/Oncology Physician [facility]  (Office):       [phone] (Work cell):  [phone]   02/12/2017 3:56 PM   This document serves as a record of services personally performed by Wyvonnia Lora, MD. It was created on his behalf by Chestine Spore, a trained medical scribe. The creation of this record is based on the scribe's personal observations and the provider's statements to them.   .I have reviewed the above documentation for accuracy and completeness, and I agree with the above. Wyvonnia Lora MD MS

## 2017-02-12 NOTE — ED Notes (Signed)
[facility] called. 

## 2017-02-12 NOTE — Procedures (Signed)
Hemodialysis Insertion Procedure Note Fredirick MaudlinDareon Agrusa [id number] 05/28/1990  Procedure: Insertion of Hemodialysis Catheter Type: 3 port   Indications: Plasmapheresis   Procedure Details Consent: Risks of procedure as well as the alternatives and risks of each were explained to the (patient/caregiver).  Consent for procedure obtained. Time Out: Verified patient identification, verified procedure, site/side was marked, verified correct patient position, special equipment/implants available, medications/allergies/relevent history reviewed, required imaging and test results available.  Performed  Maximum sterile technique was used including antiseptics, cap, gloves, gown, hand hygiene and mask. Skin prep: Chlorhexidine; local anesthetic administered A antimicrobial bonded/coated triple lumen catheter was placed in the right internal jugular vein using the Seldinger technique. Ultrasound guidance used.Yes.   Catheter placed to 15 cm. Blood aspirated via all 3 ports and then flushed x 3. Line sutured x 2 and dressing applied.  Evaluation Blood flow good Complications: No apparent complications Patient did tolerate procedure well. Chest X-ray ordered to verify placement.  CXR: pending.  Lanelle BalLawrence Marny Smethers, MD Resident with PCCM.

## 2017-02-12 NOTE — Progress Notes (Signed)
Lab values reported to Dr. Vassie LollAlva: Troponin 0.38, Platelets 18, and Hbg 7.7 - no new orders at this time. Will continue to monitor pt closely.

## 2017-02-12 NOTE — ED Notes (Signed)
Date and time results received: 02/12/17 1536 (use smartphrase ".now" to insert current time)  Test: Hgb 5.8 Critical Value: Platelets 16  Name of Provider Notified: Madilyn Hookees  Orders Received? Or Actions Taken?: Notified Madilyn Hookees and Primary RN

## 2017-02-12 NOTE — ED Provider Notes (Signed)
[facility]-EMERGENCY DEPT Provider Note   CSN: [id number] Arrival date & time: 02/12/17  0111     History   Chief Complaint Chief Complaint  Patient presents with  . Numbness    HPI Richard [name] is a 27 y.o. male.  HPI   27 year old male with history of sickle cell trait presenting for evaluation of right side paresthesia.  Patient report last night around 9 PM while he was working he developed tingling numbness sensation throughout the entire right side including right side of face, right arm and right leg.  He also endorsed a mild throbbing frontal headache.  Symptom has since improved but not fully resolved.  There is no associated fever, chills, diplopia, vision changes, neck pain, chest pain, trouble breathing, abdominal pain, confusion or weakness.  He report last week he was having flulike symptoms.  Had a flu test and strep test both of which were negative and was placed on Tamiflu.  States that his symptoms did improve.  He denies any prior history of stroke, denies recreational drug use such as cocaine, and he denies being depressed.  Past Medical History:  Diagnosis Date  . Sickle cell trait [facility](HCC)     Patient Active Problem List   Diagnosis Date Noted  . Indirect hyperbilirubinemia 10/08/2015    History reviewed. No pertinent surgical history.     Home Medications    Prior to Admission medications   Medication Sig Start Date End Date Taking? Authorizing Provider  ciprofloxacin (CIPRO) 500 MG tablet Take 1 tablet (500 mg total) by mouth 2 (two) times daily. Patient not taking: Reported on 02/12/2017 10/08/15   Arthor CaptainHarris, Abigail, PA-C    Family History Family History  Problem Relation Age of Onset  . Cancer Other   . Diabetes Other   . Hypertension Other     Social History Social History   Tobacco Use  . Smoking status: Current Every Day Smoker  . Smokeless tobacco: Never Used  Substance Use Topics  . Alcohol use: Yes    Comment:  occ  . Drug use: No     Allergies   Patient has no known allergies.   Review of Systems Review of Systems  All other systems reviewed and are negative.    Physical Exam Updated Vital Signs BP 136/90 (BP Location: Left Arm)   Pulse (!) 108   Temp 98.3 F (36.8 C) (Oral)   Resp 16   Ht 5\' 11"  (1.803 m)   Wt 123.8 kg (273 lb)   SpO2 100%   BMI 38.08 kg/m   Physical Exam  Constitutional: He is oriented to person, place, and time. He appears well-developed and well-nourished. No distress.  HENT:  Head: Atraumatic.  Mouth/Throat: Oropharynx is clear and moist.  Eyes: Conjunctivae and EOM are normal.  Neck: Normal range of motion. Neck supple.  Cardiovascular: Normal rate and regular rhythm.  Pulmonary/Chest: Effort normal and breath sounds normal.  Abdominal: Soft. Bowel sounds are normal. He exhibits no distension. There is no tenderness.  Neurological: He is alert and oriented to person, place, and time.  Neurologic exam:  Speech clear, pupils equal round reactive to light, extraocular movements intact  Normal peripheral visual fields Cranial nerves III through XII normal including no facial droop Follows commands, moves all extremities x4, normal strength to bilateral upper and lower extremities at all major muscle groups including grip Sensation normal to light touch Coordination intact, no limb ataxia, finger-nose-finger normal Rapid alternating movements normal No pronator  drift Gait is mildly antalgic, favoring L side.    Skin: No rash noted.  Psychiatric: He has a normal mood and affect.  Nursing note and vitals reviewed.    ED Treatments / Results  Labs (all labs ordered are listed, but only abnormal results are displayed) Labs Reviewed  CBC WITH DIFFERENTIAL/PLATELET - Abnormal; Notable for the following components:      Result Value   WBC 22.0 (*)    RBC 1.80 (*)    Hemoglobin 5.8 (*)    HCT 17.2 (*)    RDW 20.7 (*)    Platelets 16 (*)    nRBC  26 (*)    Neutro Abs 16.2 (*)    Lymphs Abs 5.1 (*)    All other components within normal limits  D-DIMER, QUANTITATIVE (NOT AT [facility]) - Abnormal; Notable for the following components:   D-Dimer, Quant 14.57 (*)    All other components within normal limits  LACTATE DEHYDROGENASE - Abnormal; Notable for the following components:   LDH 1,378 (*)    All other components within normal limits  RETICULOCYTES - Abnormal; Notable for the following components:   Retic Ct Pct 19.1 (*)    RBC. 1.79 (*)    Retic Count, Absolute 341.9 (*)    All other components within normal limits  COMPREHENSIVE METABOLIC PANEL - Abnormal; Notable for the following components:   Glucose, Bld 158 (*)    Calcium 8.6 (*)    AST 61 (*)    ALT 64 (*)    Total Bilirubin 2.7 (*)    All other components within normal limits  I-STAT CHEM 8, ED - Abnormal; Notable for the following components:   Glucose, Bld 139 (*)    Hemoglobin 5.4 (*)    HCT 16.0 (*)    All other components within normal limits  RESPIRATORY PANEL BY PCR  PROTIME-INR  FIBRINOGEN  VITAMIN B12  FOLATE  IRON AND TIBC  FERRITIN  URINALYSIS, ROUTINE W REFLEX MICROSCOPIC  SAVE SMEAR  POC OCCULT BLOOD, ED  I-STAT TROPONIN, ED  TYPE AND SCREEN  DIRECT ANTIGLOBULIN TEST (NOT AT [facility])  PREPARE RBC (CROSSMATCH)    EKG  EKG Interpretation  Date/Time:  Friday February 12 2017 15:25:13 EST Ventricular Rate:  110 PR Interval:    QRS Duration: 92 QT Interval:  335 QTC Calculation: 454 R Axis:   80 Text Interpretation:  Sinus tachycardia Borderline T wave abnormalities Confirmed by Tilden Fossa [phone]) on 02/12/2017 3:28:49 PM Also confirmed by Tilden Fossa [phone]), editor Sheppard Evens ([id number])  on 02/12/2017 3:42:59 PM       Radiology Dg Chest 2 View  Result Date: 02/12/2017 CLINICAL DATA:  Shortness of Breath EXAM: CHEST  2 VIEW COMPARISON:  None. FINDINGS: Lungs are clear. Heart size and pulmonary vascularity are normal. No  adenopathy. No bone lesions. IMPRESSION: No edema or consolidation. Electronically Signed   By: Bretta Bang III M.D.   On: 02/12/2017 14:23   Ct Head Wo Contrast  Result Date: 02/12/2017 CLINICAL DATA:  Right-sided numbness. History of sickle cell disease EXAM: CT HEAD WITHOUT CONTRAST TECHNIQUE: Contiguous axial images were obtained from the base of the skull through the vertex without intravenous contrast. COMPARISON:  None. FINDINGS: Brain: The ventricles are normal in size and configuration. There is no intracranial mass, hemorrhage, extra-axial fluid collection, or midline shift. Gray-white compartments are normal. No acute infarct evident. Vascular: No hyperdense vessels.  No vascular calcification evident. Skull: The bony calvarium appears intact. Sinuses/Orbits:  Visualized paranasal sinuses are clear. Orbits appear symmetric bilaterally. Other: Mastoid air cells are clear. IMPRESSION: Study within normal limits. Electronically Signed   By: Bretta Bang III M.D.   On: 02/12/2017 07:34   Ct Angio Chest Pe W And/or Wo Contrast  Result Date: 02/12/2017 CLINICAL DATA:  Shortness of breath with exertion. Elevated D-dimer. Evaluate for pulmonary embolism. EXAM: CT ANGIOGRAPHY CHEST WITH CONTRAST TECHNIQUE: Multidetector CT imaging of the chest was performed using the standard protocol during bolus administration of intravenous contrast. Multiplanar CT image reconstructions and MIPs were obtained to evaluate the vascular anatomy. CONTRAST:  ISOVUE-370 IOPAMIDOL (ISOVUE-370) INJECTION 76% COMPARISON:  Chest x-ray from same day. FINDINGS: Cardiovascular: Suboptimal opacification of the pulmonary arteries due to bolus timing and patient's body habitus. No central or lobar pulmonary embolism. Normal heart size. No pericardial effusion. Normal caliber thoracic aorta. Mediastinum/Nodes: No enlarged mediastinal, hilar, or axillary lymph nodes. Thyroid gland, trachea, and esophagus demonstrate no  significant findings. Lungs/Pleura: Lungs are clear. No pleural effusion or pneumothorax. Upper Abdomen: No acute abnormality. Musculoskeletal: Bilateral gynecomastia. No acute or significant osseous findings. Review of the MIP images confirms the above findings. IMPRESSION: 1. No central or lobar pulmonary embolism. The segmental pulmonary arteries are not well evaluated due to bolus timing and patient's body habitus. 2. No acute intrathoracic process. Electronically Signed   By: Obie Dredge M.D.   On: 02/12/2017 16:06   Mr Brain Wo Contrast  Result Date: 02/12/2017 CLINICAL DATA:  Severe headache while at work last night. RIGHT-sided numbness. Ataxia, suspect stroke. History of sickle cell trait. EXAM: MRI HEAD WITHOUT CONTRAST TECHNIQUE: Axial and coronal diffusion weighted imaging, sagittal T1, axial T2 and axial T2 FLAIR obtained on a 1.5 tesla scanner. Patient was unable to tolerate further imaging. COMPARISON:  CT HEAD February 13, 2016 at 0709 hours. FINDINGS: Sequences are mildly motion degraded. BRAIN: No reduced diffusion to suggest acute ischemia, hyperacute demyelination or hypercellular tumor. No field inhomogeneity to suggest blood products though susceptibility weighted sequence not obtained. No intrinsic T1 shortening. He ventricles and sulci are normal for patient's age. No suspicious parenchymal signal, mass or mass effect. No abnormal extra-axial fluid collections. VASCULAR: Normal major vascular flow voids present at skull base. SKULL AND UPPER CERVICAL SPINE: Mild prevertebral soft tissue fullness. No abnormal sellar expansion. No suspicious calvarial bone marrow signal. Craniocervical junction maintained. SINUSES/ORBITS: Included paranasal sinuses are well aerated. Imaged mastoid air cells are well aerated. The included ocular globes and orbital contents are non-suspicious. OTHER: None. IMPRESSION: 1. Limited motion degraded MRI, patient was unable to tolerate complete examination. 2.  Negative noncontrast MRI of the head. 3. Prevertebral fullness, seen with recent viral illness or immunocompromised states. Electronically Signed   By: Awilda Metro M.D.   On: 02/12/2017 13:49    Procedures Procedures (including critical care time)  Medications Ordered in ED Medications  iopamidol (ISOVUE-370) 76 % injection (not administered)  methylPREDNISolone sodium succinate (SOLU-MEDROL) 250 mg in sodium chloride 0.9 % 50 mL IVPB (not administered)  0.9 %  sodium chloride infusion (not administered)  0.9 %  sodium chloride infusion (not administered)  aspirin chewable tablet 324 mg (not administered)    Or  aspirin suppository 300 mg (not administered)  methylPREDNISolone sodium succinate (SOLU-MEDROL) 250 mg in sodium chloride 0.9 % 50 mL IVPB (not administered)  pantoprazole (PROTONIX) EC tablet 40 mg (not administered)  folic acid (FOLVITE) tablet 2 mg (not administered)  B-complex with vitamin C tablet 1 tablet (not administered)  acetaminophen (TYLENOL) tablet 650 mg (650 mg Oral Given 02/12/17 1011)  LORazepam (ATIVAN) tablet 1 mg (1 mg Oral Given 02/12/17 1234)  metoCLOPramide (REGLAN) injection 10 mg (10 mg Intravenous Given 02/12/17 1451)  diphenhydrAMINE (BENADRYL) injection 25 mg (25 mg Intravenous Given 02/12/17 1451)  sodium chloride 0.9 % bolus 1,000 mL (1,000 mLs Intravenous New Bag/Given 02/12/17 1448)  iopamidol (ISOVUE-370) 76 % injection 100 mL (100 mLs Intravenous Contrast Given 02/12/17 1530)     Initial Impression / Assessment and Plan / ED Course  I have reviewed the triage vital signs and the nursing notes.  Pertinent labs & imaging results that were available during my care of the patient were reviewed by me and considered in my medical decision making (see chart for details).     BP 104/81 (BP Location: Right Arm)   Pulse (!) 112   Temp 99.3 F (37.4 C) (Oral)   Resp (!) 28   Ht 5\' 11"  (1.803 m)   Wt 123.8 kg (273 lb)   SpO2 97%   BMI 38.08  kg/m    Final Clinical Impressions(s) / ED Diagnoses   Final diagnoses:  TTP (thrombotic thrombocytopenic purpura) [facility])    ED Discharge Orders    None     6:43 AM Patient report right sided hemiparesis that started last night and is improving but not fully resolved.  He is well-appearing on exam without any obvious focal neuro deficit except for a mild antalgic gait favoring the left side when walking.  He did report some mild headache therefore I will obtain head CT scan.  At this time I have low suspicion for stroke, MS, or other acute neurologic emergency given his age and resolution of symptoms.  8:26 AM I discussed care with Dr. Madilyn Hook who also evaluated pt.  We felt a brain MRI will be beneficial.  Will obtain imaging.  Tylenol for headache.    2:04 PM Pt report minimal improvement of headache with tylenol.  He has no nuchal rigidity.  Neck exam unremarkable, throat exam unremarkable.  Brain MRI showing no acute stroke.  There's evidence of prevertebral fullness, seen with recent viral illness or immunocompromised states.  No reproducible neck pain on exam.  Significant other did report for the past week pt has been having cough and worsening shortness of breath. No significant risk factor for PE.  He is however tachycardic.  Plan to provide migraine cocktail and obtain D-dimer.  If positive, will obtain chest CTA.    3:03 PM Hemoglobin is 5.4.  Patient denies any prior history of anemia or having to receive blood transfusion.  Denies having any abdominal pain, black or tarry stool or any abnormal bleeding.  No significant family history of coagulopathy.  They report having flulike symptoms several weeks prior and was treated with Tamiflu, and amoxicillin.  Flulike symptom has since resolved.  Given his anemia, his shortness of breath is likely related to that.  He does not have significant risk factor for PE. However, given low platelet, leukocytosis and new anemia, I discussed with  Dr. Madilyn Hook and we are concern for TTP/HUS.  Work up initiated.  Blood transfusion initated.    4:21 PM  I have consulted with hematologist Dr. Candise Che who request pt to be transfer to [facility] ICU for emergent plasmapheresis.  I consulted Intensivist Dr. Yetta Barre who request to consult medicine for admission.  I consulted [facility] DR. Alekh who felt pt will need to be transfer to ICU for plasmapheresis.  I will reconsult Dr. Yetta Barre.    In the mean time, Dr. Candise Che have seen and evaluated pt.  Pt does has schistocytes on peripheral smear suggestive of TTP.  He urge to have pt transfer to ICU for emergent plasmapheresis.  Dr. Yetta Barre agrees to admit pt to ICU.  I have encourage prompt transfer to [facility] ICU.  Nurse comfirmed there's a bed available. Pt currently resting comfortably. VSS.    I have also consulted nephrology Dr. Darrick Penna to informed about pt and potential involvement of nephrologist. He is made aware.    5:07 PM I have confirmed that [facility] is on the way and there's a bed available at [facility] ICU.  Pt will be transfer promptly.  He is hemodynamically stable.  Pt and family member is aware and agrees with plan.  Care discussed with Dr. Adriana Simas   CRITICAL CARE Performed by: Fayrene Helper Total critical care time: 75 minutes Critical care time was exclusive of separately billable procedures and treating other patients. Critical care was necessary to treat or prevent imminent or life-threatening deterioration. Critical care was time spent personally by me on the following activities: development of treatment plan with patient and/or surrogate as well as nursing, discussions with consultants, evaluation of patient's response to treatment, examination of patient, obtaining history from patient or surrogate, ordering and performing treatments and interventions, ordering and review of laboratory studies, ordering and review of radiographic studies, pulse oximetry and re-evaluation of  patient's condition.    Fayrene Helper, PA-C 02/12/17 1659    Fayrene Helper, PA-C 02/12/17 1708    Geoffery Lyons, MD 02/17/17 2259

## 2017-02-12 NOTE — Consult Note (Signed)
Reason for Consult:Thrombotic Microangiopathy. Referring Physician: Dr. Thelma Comp is an 27 y.o. male.  HPI: 27 yr male with bengn PMH  2 wk ago had viral syndrome and has not recovered with progressive lethargy, and last 2 d with Severe HA.  Notes R arm weakness. Felt feverish of and on but no temp taken.   No D,V, cough or SOB. Now severely weak.  Found in [facility] ED to have Hb 5.8, ptlt 16, schistocytes, ^ LDH. Bili 2.7, AST of 61, ALT of 64. Cr 1.15.  Felt most likely TTP but other TMAs in differential.  No illicit drugs, or other systemic illness or  HTN ROS only signif as above. Patient lethargic and much of Hx from girlfriend   Past Medical History:  Diagnosis Date  . Sickle cell trait ([place])     History reviewed. No pertinent surgical history.  Family History  Problem Relation Age of Onset  . Cancer Other   . Diabetes Other   . Hypertension Other     Social History:  reports that he has been smoking.  he has never used smokeless tobacco. He reports that he drinks alcohol. He reports that he does not use drugs.  Allergies: No Known Allergies  Medications:  I have reviewed the patient's current medications. Prior to Admission:  Medications Prior to Admission  Medication Sig Dispense Refill Last Dose  . ciprofloxacin (CIPRO) 500 MG tablet Take 1 tablet (500 mg total) by mouth 2 (two) times daily. (Patient not taking: Reported on 02/12/2017) 20 tablet 0 Not Taking at Unknown time    Results for orders placed or performed during the hospital encounter of 02/12/17 (from the past 48 hour(s))  CBC with Differential/Platelet     Status: Abnormal   Collection Time: 02/12/17  2:16 PM  Result Value Ref Range   WBC 22.0 (H) 4.0 - 10.5 K/uL   RBC 1.80 (L) 4.22 - 5.81 MIL/uL   Hemoglobin 5.8 (LL) 13.0 - 17.0 g/dL    Comment: REPEATED TO VERIFY CRITICAL RESULT CALLED TO, READ BACK BY AND VERIFIED WITH: CLAPP,S @ 1534 ON [id number] BY POTEAT,S    HCT 17.2 (L) 39.0 - 52.0 %   MCV 95.6  78.0 - 100.0 fL   MCH 32.2 26.0 - 34.0 pg   MCHC 33.7 30.0 - 36.0 g/dL   RDW 20.7 (H) 11.5 - 15.5 %   Platelets 16 (LL) 150 - 400 K/uL    Comment: REPEATED TO VERIFY SPECIMEN CHECKED FOR CLOTS PLATELET COUNT CONFIRMED BY SMEAR CRITICAL RESULT CALLED TO, READ BACK BY AND VERIFIED WITH: CLAPP,S @ 1534 ON [id number] BY POTEAT,S    Neutrophils Relative % 72 %   Lymphocytes Relative 23 %   Monocytes Relative 3 %   Eosinophils Relative 0 %   Basophils Relative 0 %   Myelocytes 2 %   nRBC 26 (H) 0 /100 WBC   Neutro Abs 16.2 (H) 1.7 - 7.7 K/uL   Lymphs Abs 5.1 (H) 0.7 - 4.0 K/uL   Monocytes Absolute 0.7 0.1 - 1.0 K/uL   Eosinophils Absolute 0.0 0.0 - 0.7 K/uL   Basophils Absolute 0.0 0.0 - 0.1 K/uL   RBC Morphology POLYCHROMASIA PRESENT     Comment: TARGET CELLS HOWELL/JOLLY BODIES Schistocytes present    WBC Morphology MILD LEFT SHIFT (1-5% METAS, OCC MYELO, OCC BANDS)   D-dimer, quantitative (not at [facility])     Status: Abnormal   Collection Time: 02/12/17  2:16 PM  Result Value Ref Range  D-Dimer, Quant 14.57 (H) 0.00 - 0.50 ug/mL-FEU    Comment: (NOTE) At the manufacturer cut-off of 0.50 ug/mL FEU, this assay has been documented to exclude PE with a sensitivity and negative predictive value of 97 to 99%.  At this time, this assay has not been approved by the FDA to exclude DVT/VTE. Results should be correlated with clinical presentation.   Lactate dehydrogenase     Status: Abnormal   Collection Time: 02/12/17  2:16 PM  Result Value Ref Range   LDH 1,378 (H) 98 - 192 U/L  Reticulocytes     Status: Abnormal   Collection Time: 02/12/17  2:16 PM  Result Value Ref Range   Retic Ct Pct 19.1 (H) 0.4 - 3.1 %   RBC. 1.79 (L) 4.22 - 5.81 MIL/uL   Retic Count, Absolute 341.9 (H) 19.0 - 186.0 K/uL  Protime-INR     Status: None   Collection Time: 02/12/17  2:16 PM  Result Value Ref Range   Prothrombin Time 14.0 11.4 - 15.2 seconds   INR 1.09   Fibrinogen     Status: None   Collection  Time: 02/12/17  2:16 PM  Result Value Ref Range   Fibrinogen 421 210 - 475 mg/dL  I-stat chem 8, ed     Status: Abnormal   Collection Time: 02/12/17  2:22 PM  Result Value Ref Range   Sodium 138 135 - 145 mmol/L   Potassium 4.0 3.5 - 5.1 mmol/L   Chloride 102 101 - 111 mmol/L   BUN 11 6 - 20 mg/dL   Creatinine, Ser 1.10 0.61 - 1.24 mg/dL   Glucose, Bld 139 (H) 65 - 99 mg/dL   Calcium, Ion 1.15 1.15 - 1.40 mmol/L   TCO2 23 22 - 32 mmol/L   Hemoglobin 5.4 (LL) 13.0 - 17.0 g/dL   HCT 16.0 (L) 39.0 - 52.0 %   Comment NOTIFIED PHYSICIAN   Comprehensive metabolic panel     Status: Abnormal   Collection Time: 02/12/17  2:38 PM  Result Value Ref Range   Sodium 136 135 - 145 mmol/L   Potassium 3.7 3.5 - 5.1 mmol/L   Chloride 105 101 - 111 mmol/L   CO2 24 22 - 32 mmol/L   Glucose, Bld 158 (H) 65 - 99 mg/dL   BUN 13 6 - 20 mg/dL   Creatinine, Ser 1.09 0.61 - 1.24 mg/dL   Calcium 8.6 (L) 8.9 - 10.3 mg/dL   Total Protein 7.0 6.5 - 8.1 g/dL   Albumin 3.7 3.5 - 5.0 g/dL   AST 61 (H) 15 - 41 U/L   ALT 64 (H) 17 - 63 U/L   Alkaline Phosphatase 78 38 - 126 U/L   Total Bilirubin 2.7 (H) 0.3 - 1.2 mg/dL   GFR calc non Af Amer >60 >60 mL/min   GFR calc Af Amer >60 >60 mL/min    Comment: (NOTE) The eGFR has been calculated using the CKD EPI equation. This calculation has not been validated in all clinical situations. eGFR's persistently <60 mL/min signify possible Chronic Kidney Disease.    Anion gap 7 5 - 15  Type and screen     Status: None (Preliminary result)   Collection Time: 02/12/17  2:47 PM  Result Value Ref Range   ABO/RH(D) O POS    Antibody Screen NEG    Sample Expiration 02/15/2017    Unit Number [id number]    Blood Component Type RED CELLS,LR    Unit division 00  Status of Unit ALLOCATED    Transfusion Status OK TO TRANSFUSE    Crossmatch Result Compatible    Unit Number [id number]    Blood Component Type RED CELLS,LR    Unit division 00    Status of Unit  ISSUED    Transfusion Status OK TO TRANSFUSE    Crossmatch Result Compatible   ABO/Rh     Status: None (Preliminary result)   Collection Time: 02/12/17  2:47 PM  Result Value Ref Range   ABO/RH(D) O POS   Prepare RBC     Status: None   Collection Time: 02/12/17  4:00 PM  Result Value Ref Range   Order Confirmation ORDER PROCESSED BY BLOOD BANK   Direct antiglobulin test     Status: None   Collection Time: 02/12/17  4:48 PM  Result Value Ref Range   DAT, complement NEG    DAT, IgG NEG   I-stat troponin, ED     Status: Abnormal   Collection Time: 02/12/17  4:58 PM  Result Value Ref Range   Troponin i, poc 0.38 (HH) 0.00 - 0.08 ng/mL   Comment NOTIFIED PHYSICIAN    Comment 3            Comment: Due to the release kinetics of cTnI, a negative result within the first hours of the onset of symptoms does not rule out myocardial infarction with certainty. If myocardial infarction is still suspected, repeat the test at appropriate intervals.   Prepare fresh frozen plasma     Status: None (Preliminary result)   Collection Time: 02/12/17  5:00 PM  Result Value Ref Range   Unit Number [id number]    Blood Component Type THAWED PLASMA    Unit division 00    Status of Unit ALLOCATED    Transfusion Status OK TO TRANSFUSE     Dg Chest 2 View  Result Date: 02/12/2017 CLINICAL DATA:  Shortness of Breath EXAM: CHEST  2 VIEW COMPARISON:  None. FINDINGS: Lungs are clear. Heart size and pulmonary vascularity are normal. No adenopathy. No bone lesions. IMPRESSION: No edema or consolidation. Electronically Signed   By: Lowella Grip III M.D.   On: 02/12/2017 14:23   Ct Head Wo Contrast  Result Date: 02/12/2017 CLINICAL DATA:  Right-sided numbness. History of sickle cell disease EXAM: CT HEAD WITHOUT CONTRAST TECHNIQUE: Contiguous axial images were obtained from the base of the skull through the vertex without intravenous contrast. COMPARISON:  None. FINDINGS: Brain: The ventricles are  normal in size and configuration. There is no intracranial mass, hemorrhage, extra-axial fluid collection, or midline shift. Gray-white compartments are normal. No acute infarct evident. Vascular: No hyperdense vessels.  No vascular calcification evident. Skull: The bony calvarium appears intact. Sinuses/Orbits: Visualized paranasal sinuses are clear. Orbits appear symmetric bilaterally. Other: Mastoid air cells are clear. IMPRESSION: Study within normal limits. Electronically Signed   By: Lowella Grip III M.D.   On: 02/12/2017 07:34   Ct Angio Chest Pe W And/or Wo Contrast  Result Date: 02/12/2017 CLINICAL DATA:  Shortness of breath with exertion. Elevated D-dimer. Evaluate for pulmonary embolism. EXAM: CT ANGIOGRAPHY CHEST WITH CONTRAST TECHNIQUE: Multidetector CT imaging of the chest was performed using the standard protocol during bolus administration of intravenous contrast. Multiplanar CT image reconstructions and MIPs were obtained to evaluate the vascular anatomy. CONTRAST:  116m ISOVUE-370 IOPAMIDOL (ISOVUE-370) INJECTION 76% COMPARISON:  Chest x-ray from same day. FINDINGS: Cardiovascular: Suboptimal opacification of the pulmonary arteries due to bolus timing and patient's body habitus.  No central or lobar pulmonary embolism. Normal heart size. No pericardial effusion. Normal caliber thoracic aorta. Mediastinum/Nodes: No enlarged mediastinal, hilar, or axillary lymph nodes. Thyroid gland, trachea, and esophagus demonstrate no significant findings. Lungs/Pleura: Lungs are clear. No pleural effusion or pneumothorax. Upper Abdomen: No acute abnormality. Musculoskeletal: Bilateral gynecomastia. No acute or significant osseous findings. Review of the MIP images confirms the above findings. IMPRESSION: 1. No central or lobar pulmonary embolism. The segmental pulmonary arteries are not well evaluated due to bolus timing and patient's body habitus. 2. No acute intrathoracic process. Electronically Signed    By: Titus Dubin M.D.   On: 02/12/2017 16:06   Mr Brain Wo Contrast  Result Date: 02/12/2017 CLINICAL DATA:  Severe headache while at work last night. RIGHT-sided numbness. Ataxia, suspect stroke. History of sickle cell trait. EXAM: MRI HEAD WITHOUT CONTRAST TECHNIQUE: Axial and coronal diffusion weighted imaging, sagittal T1, axial T2 and axial T2 FLAIR obtained on a 1.5 tesla scanner. Patient was unable to tolerate further imaging. COMPARISON:  CT HEAD February 13, 2016 at 0709 hours. FINDINGS: Sequences are mildly motion degraded. BRAIN: No reduced diffusion to suggest acute ischemia, hyperacute demyelination or hypercellular tumor. No field inhomogeneity to suggest blood products though susceptibility weighted sequence not obtained. No intrinsic T1 shortening. He ventricles and sulci are normal for patient's age. No suspicious parenchymal signal, mass or mass effect. No abnormal extra-axial fluid collections. VASCULAR: Normal major vascular flow voids present at skull base. SKULL AND UPPER CERVICAL SPINE: Mild prevertebral soft tissue fullness. No abnormal sellar expansion. No suspicious calvarial bone marrow signal. Craniocervical junction maintained. SINUSES/ORBITS: Included paranasal sinuses are well aerated. Imaged mastoid air cells are well aerated. The included ocular globes and orbital contents are non-suspicious. OTHER: None. IMPRESSION: 1. Limited motion degraded MRI, patient was unable to tolerate complete examination. 2. Negative noncontrast MRI of the head. 3. Prevertebral fullness, seen with recent viral illness or immunocompromised states. Electronically Signed   By: Elon Alas M.D.   On: 02/12/2017 13:49    ROS Blood pressure (!) 144/89, pulse (!) 109, temperature 99 F (37.2 C), temperature source Oral, resp. rate (!) 29, height '5\' 11"'$  (1.803 m), weight 122.3 kg (269 lb 10 oz), SpO2 96 %. Physical Exam Physical Examination: General appearance - lethargic, but  oriented,obese Mental status - as above, falls asleep immed Eyes - pupils equal and reactive, extraocular eye movements intact Mouth - some petech on lips Neck - adenopathy noted PCL Lymphatics - posterior cervical nodes Chest - clear to auscultation, no wheezes, rales or rhonchi, symmetric air entry Heart - normal rate, regular rhythm, normal S1, S2, no murmurs, rubs, clicks or gallops Abdomen - obese, striae no organomeg Musculoskeletal - no joint tenderness, deformity or swelling Extremities - Tr edema, pulses 2+/4+ Skin - normal coloration and turgor, no rashes, no suspicious skin lesions noted  Assessment/Plan: 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   Needs TPE.  AdamsT pending 2 Recent viral syndrome 3 Anemia 4 Thrombocytopenia P TPE, line,    Orli Degrave 02/12/2017, 7:15 PM

## 2017-02-12 NOTE — ED Notes (Signed)
MRI reports patient was only able to complete half of scan due to headache.

## 2017-02-12 NOTE — H&P (Signed)
PULMONARY / CRITICAL CARE MEDICINE   Name: Richard [name] MRN: [id number] DOB: [birth year]    ADMISSION DATE:  02/12/2017 CONSULTATION DATE:  02/12/2017  REFERRING MD:  Dr. Beryle Lathe and ED provider   CHIEF COMPLAINT:  Right sided hemiparesis   HISTORY OF PRESENT ILLNESS:   Richard [name] is a 6 yoM with sickle cell trait who presented to the [facility] ED for 9 hours of right sided paresthesia. As significant portion of the history was obtained from the chart and patients fiance as the patient is lethargic. He stated that around 2100 on 1/17 while at work he developed tingling and numbness of the entire right side of his body including his face with an associated headache. At that time he denied fever, chills, diplopia, vision changes, neck pain, dyspnea, abdominal pain, chest pain, confusion or generalized fatigue. He was recently placed on Tamiflu and amoxicillin for flulike symptoms the prior week but had been improving. The patient denies similar event previously.   In the ED CT and MRI of the head were negative for acute intracranial process. Initial CBC indicated a plt count of 16K and Hgb 5.8. LDH 1378, D-dimer 14.57, Retics 19.1%, with normal PT/INR/Fibrinogen 14.0/1.09/421 respectively. CMP was unremarkable except for slighytly elevated AST/ALT 61/64 with total bili of 2.7. At this point significant concern for TTP/HUS was noted with blood transfusion ordered. At this point he was determined to be in need of emergent plasmapheresis and was prepared for transfer to [facility] ICU. Upon arrival an HD catheter was placed in preparation of the procedure.   PAST MEDICAL HISTORY :  He  has a past medical history of Sickle cell trait ([facility]).  PAST SURGICAL HISTORY: He  has no past surgical history on file.  No Known Allergies  No current facility-administered medications on file prior to encounter.    Current Outpatient Medications on File Prior to Encounter  Medication Sig  .  ciprofloxacin (CIPRO) 500 MG tablet Take 1 tablet (500 mg total) by mouth 2 (two) times daily. (Patient not taking: Reported on 02/12/2017)    FAMILY HISTORY:  His indicated that the status of his other is unknown.  SOCIAL HISTORY: He  reports that he has been smoking.  he has never used smokeless tobacco. He reports that he drinks alcohol. He reports that he does not use drugs.  REVIEW OF SYSTEMS:   Negative except as per HPI.  SUBJECTIVE:  Patient uncomfortable, in mild pain from the catheter placement described as a soreness. Otherwise he stated he is just tired.   VITAL SIGNS: BP 138/84   Pulse (!) 105   Temp 99.8 F (37.7 C) (Oral)   Resp (!) 26   Ht 5\' 11"  (1.803 m)   Wt 269 lb 10 oz (122.3 kg)   SpO2 96%   BMI 37.60 kg/m   HEMODYNAMICS:    VENTILATOR SETTINGS:    INTAKE / OUTPUT: I/O last 3 completed shifts: In: 394.5 [Blood:342.5; IV Piggyback:52] Out: -   PHYSICAL EXAMINATION: General:  Not diaphoretic, resting in his bed, calm Neuro:  Alert and oriented to name, location but not entirely to events. He appears slightly lethargic  HEENT:  EOM intact, PERRL, mucus membranes moist Cardiovascular:  S1-2 clear to ascultation bilaterally, tachycardic, no murmur auscultated  Lungs:  Clear to auscultation bilterally, no wheezing or rhonchi noted Abdomen:  Soft, non-tender, non-distended, bowel sound present Musculoskeletal:  No pedal edema, pulses intact dorsalis pedis bilaterally  Skin:  Skin is warm to touch,  clear of evident rashes  LABS:  BMET Recent Labs  Lab 02/12/17 1422 02/12/17 1438  NA 138 136  K 4.0 3.7  CL 102 105  CO2  --  24  BUN 11 13  CREATININE 1.10 1.09  GLUCOSE 139* 158*    Electrolytes Recent Labs  Lab 02/12/17 1438  CALCIUM 8.6*    CBC Recent Labs  Lab 02/12/17 1416 02/12/17 1422  WBC 22.0*  --   HGB 5.8* 5.4*  HCT 17.2* 16.0*  PLT 16*  --     Coag's Recent Labs  Lab 02/12/17 1416  INR 1.09    Sepsis  Markers No results for input(s): LATICACIDVEN, PROCALCITON, O2SATVEN in the last 168 hours.  ABG No results for input(s): PHART, PCO2ART, PO2ART in the last 168 hours.  Liver Enzymes Recent Labs  Lab 02/12/17 1438  AST 61*  ALT 64*  ALKPHOS 78  BILITOT 2.7*  ALBUMIN 3.7    Cardiac Enzymes No results for input(s): TROPONINI, PROBNP in the last 168 hours.  Glucose No results for input(s): GLUCAP in the last 168 hours.  Imaging Dg Chest 2 View  Result Date: 02/12/2017 CLINICAL DATA:  Shortness of Breath EXAM: CHEST  2 VIEW COMPARISON:  None. FINDINGS: Lungs are clear. Heart size and pulmonary vascularity are normal. No adenopathy. No bone lesions. IMPRESSION: No edema or consolidation. Electronically Signed   By: Bretta BangWilliam  Woodruff III M.D.   On: 02/12/2017 14:23   Ct Head Wo Contrast  Result Date: 02/12/2017 CLINICAL DATA:  Right-sided numbness. History of sickle cell disease EXAM: CT HEAD WITHOUT CONTRAST TECHNIQUE: Contiguous axial images were obtained from the base of the skull through the vertex without intravenous contrast. COMPARISON:  None. FINDINGS: Brain: The ventricles are normal in size and configuration. There is no intracranial mass, hemorrhage, extra-axial fluid collection, or midline shift. Gray-white compartments are normal. No acute infarct evident. Vascular: No hyperdense vessels.  No vascular calcification evident. Skull: The bony calvarium appears intact. Sinuses/Orbits: Visualized paranasal sinuses are clear. Orbits appear symmetric bilaterally. Other: Mastoid air cells are clear. IMPRESSION: Study within normal limits. Electronically Signed   By: Bretta BangWilliam  Woodruff III M.D.   On: 02/12/2017 07:34   Ct Angio Chest Pe W And/or Wo Contrast  Result Date: 02/12/2017 CLINICAL DATA:  Shortness of breath with exertion. Elevated D-dimer. Evaluate for pulmonary embolism. EXAM: CT ANGIOGRAPHY CHEST WITH CONTRAST TECHNIQUE: Multidetector CT imaging of the chest was performed  using the standard protocol during bolus administration of intravenous contrast. Multiplanar CT image reconstructions and MIPs were obtained to evaluate the vascular anatomy. CONTRAST:  100mL ISOVUE-370 IOPAMIDOL (ISOVUE-370) INJECTION 76% COMPARISON:  Chest x-ray from same day. FINDINGS: Cardiovascular: Suboptimal opacification of the pulmonary arteries due to bolus timing and patient's body habitus. No central or lobar pulmonary embolism. Normal heart size. No pericardial effusion. Normal caliber thoracic aorta. Mediastinum/Nodes: No enlarged mediastinal, hilar, or axillary lymph nodes. Thyroid gland, trachea, and esophagus demonstrate no significant findings. Lungs/Pleura: Lungs are clear. No pleural effusion or pneumothorax. Upper Abdomen: No acute abnormality. Musculoskeletal: Bilateral gynecomastia. No acute or significant osseous findings. Review of the MIP images confirms the above findings. IMPRESSION: 1. No central or lobar pulmonary embolism. The segmental pulmonary arteries are not well evaluated due to bolus timing and patient's body habitus. 2. No acute intrathoracic process. Electronically Signed   By: Obie DredgeWilliam T Derry M.D.   On: 02/12/2017 16:06   Mr Brain Wo Contrast  Result Date: 02/12/2017 CLINICAL DATA:  Severe headache while at work  last night. RIGHT-sided numbness. Ataxia, suspect stroke. History of sickle cell trait. EXAM: MRI HEAD WITHOUT CONTRAST TECHNIQUE: Axial and coronal diffusion weighted imaging, sagittal T1, axial T2 and axial T2 FLAIR obtained on a 1.5 tesla scanner. Patient was unable to tolerate further imaging. COMPARISON:  CT HEAD February 13, 2016 at 0709 hours. FINDINGS: Sequences are mildly motion degraded. BRAIN: No reduced diffusion to suggest acute ischemia, hyperacute demyelination or hypercellular tumor. No field inhomogeneity to suggest blood products though susceptibility weighted sequence not obtained. No intrinsic T1 shortening. He ventricles and sulci are normal  for patient's age. No suspicious parenchymal signal, mass or mass effect. No abnormal extra-axial fluid collections. VASCULAR: Normal major vascular flow voids present at skull base. SKULL AND UPPER CERVICAL SPINE: Mild prevertebral soft tissue fullness. No abnormal sellar expansion. No suspicious calvarial bone marrow signal. Craniocervical junction maintained. SINUSES/ORBITS: Included paranasal sinuses are well aerated. Imaged mastoid air cells are well aerated. The included ocular globes and orbital contents are non-suspicious. OTHER: None. IMPRESSION: 1. Limited motion degraded MRI, patient was unable to tolerate complete examination. 2. Negative noncontrast MRI of the head. 3. Prevertebral fullness, seen with recent viral illness or immunocompromised states. Electronically Signed   By: Awilda Metro M.D.   On: 02/12/2017 13:49   STUDIES:  MRI brain  1/18 CT Head 1/18 CT angio 1/18  CULTURES: Blood Cx 1/18 >>>  ANTIBIOTICS: Non indicated at this time  SIGNIFICANT EVENTS: Transferred to ICU at [facility]  LINES/TUBES: Right IJ HD cath 1/18>>> PIV 1/18>>>  DISCUSSION: 81 yoM with sickle cell trait who presented with right sided hemiparesis found to be with TTP with ADAMTS-13 pending although etiology is unclear at this time. He was recently treated with amoxicillin and tamiflu for a flulike illness the previous week which are not commonly known to induce TTP. The patient was transferred to the ICU and prepared for plasmapheresis. He had received two units of blood.    ASSESSMENT / PLAN:  PULMONARY A: NO acute issues at this time. Dyspneic on admission  P:   Continue to monitor with SpO2 and vitals as indicated  CT Angiogram negative   CARDIOVASCULAR A:  Denied chest pain  EKG sinus tachycardia with no S1Q3T3 noted.  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  ADAMTS-13 ordered and pending Plt count of 16K and Hgb 5.8. LDH 1,378 Coombs negative DIC negative with PT/INR/Fibrinogen 14.0/1.09/421 respectively Patient denied drugs of abuse including Cocaine  P:  Given two units of blood Typed and screen ordered Plasmapheresis ordered and beginning  Repeat CBC q4 hours x 4 for plt and Hgb monitoring  CBC am routine therafter Anemia evaluation labs pending  Haptoglobin ordered Urine drug screen pending   INFECTIOUS A:   No acute indication of infection at this time Patient is afebrile, hypertensive on admission,  Leukocytosis 22.0 on admission, HR >90, tachypneic, this can be explained by the patients TTP P:  Will monitor for signs of infection No indication for antibiotics  Blood culture ordered   UA pending   ENDOCRINE A:   NO acute endocrine conditions noted   P:   No treatment indicated at this time  NEUROLOGIC A:   Patient is alert and answering questions with mild confusion P:   Pain is mild 1-2 at this time, will monitor  Continue to monitor mental status  FAMILY  - Updates: Mother has been updated. Father spoke to patient on the phone following HD catheter placement.   - Inter-disciplinary family meet or Palliative Care meeting due by:  day 7  Lanelle Bal, MD Resident with [facility]  Pulmonary and Critical Care Medicine [facility] Pager: [phone]  02/12/2017, 8:33 PM

## 2017-02-12 NOTE — Procedures (Signed)
TPE via RIJ cath.  More allert. Plasma becoming available. Will follow closely for problems.

## 2017-02-12 NOTE — Significant Event (Signed)
RN called HD to let them aware of patient's arrival. Per charge RN in HD, that patient needed to get two units of pRBC stat and to redraw a new type and screen prior to start of phasmapheresis. CCM already aware of need for line placement for phasmapheresis and is coming to place line.   Patient arrived from [facility] with only one unit of blood, which is infusing at this time. Patient need another unit of blood stat. Per protocol, patient would need another type and screen to get that 2nd unit here at [facility].   RN spoke with Dr. Darrick Pennaeterding, who agreed that we can transfuse the 2nd unit of blood emergently.   RN called lab who is at bedside and they have stated that they would need to wait at least two hours prior to drawing that new order of type and screen.

## 2017-02-12 NOTE — Significant Event (Signed)
Accepted patient from [name], by [facility]. Patient arrived to room 2H07 with blood infusing at 120cc/hour. Patient is drowsy, able to responds to questions correctly. No personal belongings arrived with patient. Patient's girlfriend Richard [name] at bedside and is updated. Per Richard [name], patient's mother is aware of patient being in this hospital and the plan of care.       Richard [name]

## 2017-02-12 NOTE — ED Notes (Signed)
Patient back from MRI and RN was informed that patient felt claustrophobic and was not able to complete MRI. PA notified and medication ordered. MRI reports another patient will be going before patient. MRI will call about 30 mins before coming to get patient to administer medication.

## 2017-02-12 NOTE — ED Notes (Signed)
Pt is in CT

## 2017-02-12 NOTE — ED Notes (Signed)
Back from CT

## 2017-02-12 NOTE — ED Notes (Signed)
Bed: WA03 Expected date:  Expected time:  Means of arrival:  Comments: TRI 3

## 2017-02-12 NOTE — ED Notes (Signed)
RN AND MD NOTIFIED OF PATIENT'S TROPONIN LEVEL OF 0.38

## 2017-02-12 NOTE — ED Triage Notes (Signed)
Pt states he has been sick lately and was tested for the flu and strep which were negative  Pt states they gave him tamiflu and he started to feel better  Pt states now he gets SOB on exertion   Pt states tonight at work he started to get numbness on his right side  No neuro deficits noted in triage

## 2017-02-13 ENCOUNTER — Other Ambulatory Visit (HOSPITAL_COMMUNITY): Payer: Managed Care, Other (non HMO)

## 2017-02-13 LAB — RESPIRATORY PANEL BY PCR
Adenovirus: NOT DETECTED
BORDETELLA PERTUSSIS-RVPCR: NOT DETECTED
CHLAMYDOPHILA PNEUMONIAE-RVPPCR: NOT DETECTED
CORONAVIRUS HKU1-RVPPCR: NOT DETECTED
Coronavirus 229E: NOT DETECTED
Coronavirus NL63: NOT DETECTED
Coronavirus OC43: NOT DETECTED
INFLUENZA A-RVPPCR: NOT DETECTED
Influenza B: NOT DETECTED
METAPNEUMOVIRUS-RVPPCR: NOT DETECTED
Mycoplasma pneumoniae: NOT DETECTED
PARAINFLUENZA VIRUS 2-RVPPCR: NOT DETECTED
PARAINFLUENZA VIRUS 3-RVPPCR: NOT DETECTED
PARAINFLUENZA VIRUS 4-RVPPCR: NOT DETECTED
Parainfluenza Virus 1: NOT DETECTED
RHINOVIRUS / ENTEROVIRUS - RVPPCR: NOT DETECTED
Respiratory Syncytial Virus: NOT DETECTED

## 2017-02-13 LAB — THERAPEUTIC PLASMA EXCHANGE (BLOOD BANK)
UNIT DIVISION: 0
UNIT DIVISION: 0
UNIT DIVISION: 0
UNIT DIVISION: 0
UNIT DIVISION: 0
UNIT DIVISION: 0
UNIT DIVISION: 0
UNIT DIVISION: 0
Unit division: 0
Unit division: 0
Unit division: 0
Unit division: 0
Unit division: 0
Unit division: 0
Unit division: 0

## 2017-02-13 LAB — BASIC METABOLIC PANEL
Anion gap: 12 (ref 5–15)
BUN: 12 mg/dL (ref 6–20)
CALCIUM: 8.6 mg/dL — AB (ref 8.9–10.3)
CO2: 24 mmol/L (ref 22–32)
CREATININE: 1.07 mg/dL (ref 0.61–1.24)
Chloride: 103 mmol/L (ref 101–111)
GFR calc Af Amer: >60 mL/min (ref >60)
GFR calc non Af Amer: >60 mL/min (ref >60)
GLUCOSE: 148 mg/dL — AB (ref 65–99)
Potassium: 3.6 mmol/L (ref 3.5–5.1)
Sodium: 139 mmol/L (ref 135–145)

## 2017-02-13 LAB — CBC
HCT: 23.5 % — ABNORMAL LOW (ref 39.0–52.0)
HEMATOCRIT: 23.3 % — AB (ref 39.0–52.0)
HEMATOCRIT: 24 % — AB (ref 39.0–52.0)
HEMATOCRIT: 23.4 % — AB (ref 39.0–52.0)
HEMOGLOBIN: 7.7 g/dL — AB (ref 13.0–17.0)
HEMOGLOBIN: 7.9 g/dL — AB (ref 13.0–17.0)
HEMOGLOBIN: 7.8 g/dL — AB (ref 13.0–17.0)
Hemoglobin: 8 g/dL — ABNORMAL LOW (ref 13.0–17.0)
MCH: 30.2 pg (ref 26.0–34.0)
MCH: 31.5 pg (ref 26.0–34.0)
MCH: 30.9 pg (ref 26.0–34.0)
MCH: 31 pg (ref 26.0–34.0)
MCHC: 32.8 g/dL (ref 30.0–36.0)
MCHC: 33.9 g/dL (ref 30.0–36.0)
MCHC: 33.3 g/dL (ref 30.0–36.0)
MCHC: 33.3 g/dL (ref 30.0–36.0)
MCV: 92.2 fL (ref 78.0–100.0)
MCV: 92.8 fL (ref 78.0–100.0)
MCV: 92.7 fL (ref 78.0–100.0)
MCV: 92.9 fL (ref 78.0–100.0)
PLATELETS: 20 10*3/uL — AB (ref 150–400)
Platelets: 13 10*3/uL — CL (ref 150–400)
Platelets: 13 10*3/uL — CL (ref 150–400)
Platelets: 25 10*3/uL — CL (ref 150–400)
RBC: 2.55 MIL/uL — AB (ref 4.22–5.81)
RBC: 2.51 MIL/uL — ABNORMAL LOW (ref 4.22–5.81)
RBC: 2.59 MIL/uL — ABNORMAL LOW (ref 4.22–5.81)
RBC: 2.52 MIL/uL — AB (ref 4.22–5.81)
RDW: 19.3 % — ABNORMAL HIGH (ref 11.5–15.5)
RDW: 19.8 % — AB (ref 11.5–15.5)
RDW: 20.3 % — AB (ref 11.5–15.5)
RDW: 20.8 % — ABNORMAL HIGH (ref 11.5–15.5)
WBC: 30.4 10*3/uL — ABNORMAL HIGH (ref 4.0–10.5)
WBC: 31.6 10*3/uL — ABNORMAL HIGH (ref 4.0–10.5)
WBC: 26.5 10*3/uL — ABNORMAL HIGH (ref 4.0–10.5)
WBC: 28.7 10*3/uL — AB (ref 4.0–10.5)

## 2017-02-13 LAB — RETICULOCYTES
RBC.: 2.59 MIL/uL — ABNORMAL LOW (ref 4.22–5.81)
Retic Count, Absolute: 383.3 10*3/uL — ABNORMAL HIGH (ref 19.0–186.0)
Retic Ct Pct: 14.8 % — ABNORMAL HIGH (ref 0.4–3.1)

## 2017-02-13 LAB — BPAM RBC
Blood Product Expiration Date: 201901262359
ISSUE DATE / TIME: 201901181917
Unit Type and Rh: 9500

## 2017-02-13 LAB — HEMOGLOBIN A1C
Hgb A1c MFr Bld: 4.6 % — ABNORMAL LOW (ref 4.8–5.6)
MEAN PLASMA GLUCOSE: 85.32 mg/dL

## 2017-02-13 LAB — RAPID URINE DRUG SCREEN, HOSP PERFORMED
Amphetamines: NOT DETECTED
BARBITURATES: NOT DETECTED
Benzodiazepines: NOT DETECTED
Cocaine: NOT DETECTED
Opiates: NOT DETECTED
Tetrahydrocannabinol: NOT DETECTED

## 2017-02-13 LAB — TYPE AND SCREEN
ABO/RH(D): O POS
Antibody Screen: NEGATIVE
Unit division: 0

## 2017-02-13 LAB — HIV ANTIBODY (ROUTINE TESTING W REFLEX): HIV Screen 4th Generation wRfx: NONREACTIVE

## 2017-02-13 LAB — TROPONIN I
Troponin I: 0.3 ng/mL (ref <0.03)
Troponin I: 0.22 ng/mL (ref <0.03)

## 2017-02-13 LAB — LACTATE DEHYDROGENASE: LDH: 729 U/L — ABNORMAL HIGH (ref 98–192)

## 2017-02-13 LAB — MAGNESIUM: Magnesium: 1.6 mg/dL — ABNORMAL LOW (ref 1.7–2.4)

## 2017-02-13 LAB — SURGICAL PCR SCREEN
MRSA, PCR: NEGATIVE
Staphylococcus aureus: NEGATIVE

## 2017-02-13 LAB — ABO/RH: ABO/RH(D): O POS

## 2017-02-13 MED ORDER — CALCIUM CARBONATE ANTACID 500 MG PO CHEW: 2.0000 | CHEWABLE_TABLET | ORAL | Status: AC

## 2017-02-13 MED ORDER — SODIUM CHLORIDE 0.9 % IV SOLN
2.0000 g | Freq: Once | INTRAVENOUS | Status: AC
  Administered 2017-02-13: 2 g via INTRAVENOUS
  Filled 2017-02-13: qty 20

## 2017-02-13 MED ORDER — ACD FORMULA A 0.73-2.45-2.2 GM/100ML VI SOLN
Status: AC
  Administered 2017-02-13: 100 mL via INTRAVENOUS
  Filled 2017-02-13: qty 500

## 2017-02-13 MED ORDER — CALCIUM CARBONATE ANTACID 500 MG PO CHEW
CHEWABLE_TABLET | ORAL | Status: AC
  Administered 2017-02-13: 200 mg via ORAL
  Filled 2017-02-13: qty 4

## 2017-02-13 MED ORDER — POTASSIUM CHLORIDE 10 MEQ/50ML IV SOLN
10.0000 meq | INTRAVENOUS | Status: DC
  Administered 2017-02-13: 10 meq via INTRAVENOUS
  Filled 2017-02-13: qty 50

## 2017-02-13 MED ORDER — ACETAMINOPHEN 325 MG PO TABS
650.0000 mg | ORAL_TABLET | ORAL | Status: DC | PRN
Start: 2017-02-13 — End: 2017-02-13

## 2017-02-13 MED ORDER — ACD FORMULA A 0.73-2.45-2.2 GM/100ML VI SOLN
500.0000 mL | Status: DC
  Administered 2017-02-13: 500 mL via INTRAVENOUS
  Filled 2017-02-13: qty 500

## 2017-02-13 MED ORDER — DIPHENHYDRAMINE HCL 25 MG PO CAPS: 25.0000 mg | ORAL_CAPSULE | Freq: Four times a day (QID) | ORAL | Status: DC | PRN

## 2017-02-13 MED ORDER — MAGNESIUM SULFATE 2 GM/50ML IV SOLN
2.0000 g | Freq: Once | INTRAVENOUS | Status: AC
  Administered 2017-02-13: 2 g via INTRAVENOUS
  Filled 2017-02-13: qty 50

## 2017-02-13 MED ORDER — ANTICOAGULANT SODIUM CITRATE 4% (200MG/5ML) IV SOLN
5.0000 mL | Freq: Once | Status: AC
  Administered 2017-02-13: 5 mL
  Filled 2017-02-13: qty 250

## 2017-02-13 NOTE — Progress Notes (Signed)
Attending note: I have seen and examined the patient with nurse practitioner/resident and agree with the note. History, labs and imaging reviewed.  27 year old with history of sickle cell trait admitted with right-sided paresthesia after flu illness.  CT head, MRI were nondiagnostic.  Found to have severe anemia, thrombocytopenia with schistocytes.  Diagnosed with TTP Transferred [facility] ICU and plasmapheresis initiated.  Blood pressure 132/75, pulse (!) 106, temperature 98.3 F (36.8 C), resp. rate (!) 21, height 5\' 11"  (1.803 m), weight 268 lb (121.6 kg), SpO2 98 %. Gen:      No acute distress HEENT:  EOMI, sclera anicteric Neck:     No masses; no thyromegaly Lungs:    Clear to auscultation bilaterally; normal respiratory effort CV:         Regular rate and rhythm; no murmurs Abd:      + bowel sounds; soft, non-tender; no palpable masses, no distension Ext:    No edema; adequate peripheral perfusion Skin:      Warm and dry; no rash Neuro: alert and oriented x 3 Psych: normal mood and affect  Labs significant for  BUN/creatinine 12/1.07,  LDH 729 WBC 28.7, platelets 20, troponin 0 0.22  CT chest 02/12/17-no pulmonary embolism, lungs are clear. Chest x-ray 02/12/17-clear lungs.  No active disease.  I have reviewed the images personally.  Assessment/plan: Thrombotic microangiopathy, TTP Continue plasmapheresis. ADAMTS 13 pending Follow blood counts. Leukocytosis is likely from stress. Observe off antibiotics  Sinus tachycardia, mild elevation in troponin > improving Likely demand.   Renal function- normal Monitor urine output   Keep in ICU today  The patient is critically ill with multiple organ systems failure and requires high complexity decision making for assessment and support, frequent evaluation and titration of therapies, application of advanced monitoring technologies and extensive interpretation of multiple databases.  Critical care time - 35 mins. This represents my  time independent of the NPs time taking care of the pt.  Chilton GreathousePraveen Mckaylin Bastien MD [facility] Pager [phone] If no answer or after 3pm call: [phone] 02/13/2017, 3:20 PM

## 2017-02-13 NOTE — Procedures (Signed)
TPE going well, 1 vol  , using Plasma, via RIJ cath.  No sx of low Ca.  tol well.

## 2017-02-13 NOTE — Progress Notes (Signed)
[facility]OTOCOL FOR AM LAB REPLACEMENT ONLY  The patient does apply for the [facility] Adult [facility] Electrolyte Replacment Protocol based on the criteria listed below:   1. Is GFR >/= 40 ml/min? Yes.    Patient's GFR today is <60 2. Is urine output >/= 0.5 ml/kg/hr for the last 6 hours? Yes.   Patient's UOP is 2.47 ml/kg/hr 3. Is BUN < 60 mg/dL? Yes.    Patient's BUN today is 12 4. Abnormal electrolyte  K 3.6 5. Ordered repletion with: per protocol 6. If a panic level lab has been reported, has the CCM MD in charge been notified? Yes.  .   Physician:  Gillis Santalva  Camerin Ladouceur McEachran 02/13/2017 6:24 AM

## 2017-02-13 NOTE — Progress Notes (Signed)
Subjective: Interval History: has no complaint .  Objective: Vital signs in last 24 hours: Temp:  [98.4 F (36.9 C)-100.1 F (37.8 C)] 98.8 F (37.1 C) (01/18 2337) Pulse Rate:  [97-120] 97 (01/19 0700) Resp:  [14-33] 20 (01/19 0700) BP: (104-166)/(48-97) 105/48 (01/19 0700) SpO2:  [92 %-100 %] 98 % (01/19 0700) Weight:  [121.6 kg (268 lb)-122.3 kg (269 lb 10 oz)] 121.6 kg (268 lb) (01/19 0500) Weight change: -1.532 kg (-6 oz)  Intake/Output from previous day: 01/18 0701 - 01/19 0700 In: 1780.8 [I.V.:306.3; Blood:972.5; IV Piggyback:502] Out: 1800 [Urine:1800] Intake/Output this shift: No intake/output data recorded.  General appearance: alert, cooperative and no distress Neck: RIJ cath Resp: clear to auscultation bilaterally Cardio: S1, S2 normal GI: soft, non-tender; bowel sounds normal; no masses,  no organomegaly Extremities: extremities normal, atraumatic, no cyanosis or edema  Lab Results: Recent Labs    02/13/17 0131 02/13/17 0425  WBC 30.4* 31.6*  HGB 7.7* 7.9*  HCT 23.5* 23.3*  PLT 13* 13*   BMET:  Recent Labs    02/12/17 1438 02/13/17 0410  NA 136 139  K 3.7 3.6  CL 105 103  CO2 24 24  GLUCOSE 158* 148*  BUN 13 12  CREATININE 1.09 1.07  CALCIUM 8.6* 8.6*   No results for input(s): PTH in the last 72 hours. Iron Studies:  Recent Labs    02/12/17 1441  IRON 209*  TIBC 304  FERRITIN 1,230*    Studies/Results: Dg Chest 2 View  Result Date: 02/12/2017 CLINICAL DATA:  Shortness of Breath EXAM: CHEST  2 VIEW COMPARISON:  None. FINDINGS: Lungs are clear. Heart size and pulmonary vascularity are normal. No adenopathy. No bone lesions. IMPRESSION: No edema or consolidation. Electronically Signed   By: Bretta BangWilliam  Woodruff III M.D.   On: 02/12/2017 14:23   Ct Head Wo Contrast  Result Date: 02/12/2017 CLINICAL DATA:  Right-sided numbness. History of sickle cell disease EXAM: CT HEAD WITHOUT CONTRAST TECHNIQUE: Contiguous axial images were obtained  from the base of the skull through the vertex without intravenous contrast. COMPARISON:  None. FINDINGS: Brain: The ventricles are normal in size and configuration. There is no intracranial mass, hemorrhage, extra-axial fluid collection, or midline shift. Gray-white compartments are normal. No acute infarct evident. Vascular: No hyperdense vessels.  No vascular calcification evident. Skull: The bony calvarium appears intact. Sinuses/Orbits: Visualized paranasal sinuses are clear. Orbits appear symmetric bilaterally. Other: Mastoid air cells are clear. IMPRESSION: Study within normal limits. Electronically Signed   By: Bretta BangWilliam  Woodruff III M.D.   On: 02/12/2017 07:34   Ct Angio Chest Pe W And/or Wo Contrast  Result Date: 02/12/2017 CLINICAL DATA:  Shortness of breath with exertion. Elevated D-dimer. Evaluate for pulmonary embolism. EXAM: CT ANGIOGRAPHY CHEST WITH CONTRAST TECHNIQUE: Multidetector CT imaging of the chest was performed using the standard protocol during bolus administration of intravenous contrast. Multiplanar CT image reconstructions and MIPs were obtained to evaluate the vascular anatomy. CONTRAST:  100mL ISOVUE-370 IOPAMIDOL (ISOVUE-370) INJECTION 76% COMPARISON:  Chest x-ray from same day. FINDINGS: Cardiovascular: Suboptimal opacification of the pulmonary arteries due to bolus timing and patient's body habitus. No central or lobar pulmonary embolism. Normal heart size. No pericardial effusion. Normal caliber thoracic aorta. Mediastinum/Nodes: No enlarged mediastinal, hilar, or axillary lymph nodes. Thyroid gland, trachea, and esophagus demonstrate no significant findings. Lungs/Pleura: Lungs are clear. No pleural effusion or pneumothorax. Upper Abdomen: No acute abnormality. Musculoskeletal: Bilateral gynecomastia. No acute or significant osseous findings. Review of the MIP images confirms the  above findings. IMPRESSION: 1. No central or lobar pulmonary embolism. The segmental pulmonary  arteries are not well evaluated due to bolus timing and patient's body habitus. 2. No acute intrathoracic process. Electronically Signed   By: Obie Dredge M.D.   On: 02/12/2017 16:06   Mr Brain Wo Contrast  Result Date: 02/12/2017 CLINICAL DATA:  Severe headache while at work last night. RIGHT-sided numbness. Ataxia, suspect stroke. History of sickle cell trait. EXAM: MRI HEAD WITHOUT CONTRAST TECHNIQUE: Axial and coronal diffusion weighted imaging, sagittal T1, axial T2 and axial T2 FLAIR obtained on a 1.5 tesla scanner. Patient was unable to tolerate further imaging. COMPARISON:  CT HEAD February 13, 2016 at 0709 hours. FINDINGS: Sequences are mildly motion degraded. BRAIN: No reduced diffusion to suggest acute ischemia, hyperacute demyelination or hypercellular tumor. No field inhomogeneity to suggest blood products though susceptibility weighted sequence not obtained. No intrinsic T1 shortening. He ventricles and sulci are normal for patient's age. No suspicious parenchymal signal, mass or mass effect. No abnormal extra-axial fluid collections. VASCULAR: Normal major vascular flow voids present at skull base. SKULL AND UPPER CERVICAL SPINE: Mild prevertebral soft tissue fullness. No abnormal sellar expansion. No suspicious calvarial bone marrow signal. Craniocervical junction maintained. SINUSES/ORBITS: Included paranasal sinuses are well aerated. Imaged mastoid air cells are well aerated. The included ocular globes and orbital contents are non-suspicious. OTHER: None. IMPRESSION: 1. Limited motion degraded MRI, patient was unable to tolerate complete examination. 2. Negative noncontrast MRI of the head. 3. Prevertebral fullness, seen with recent viral illness or immunocompromised states. Electronically Signed   By: Awilda Metro M.D.   On: 02/12/2017 13:49   Dg Chest Port 1 View  Result Date: 02/12/2017 CLINICAL DATA:  Catheter placement. EXAM: PORTABLE CHEST 1 VIEW COMPARISON:  Earlier today  FINDINGS: Interval placement of right IJ catheter with tip in the projection of the distal SVC. No pneumothorax visualized. The heart size and mediastinal contours are within normal limits. Both lungs are clear. The visualized skeletal structures are unremarkable. IMPRESSION: No active disease. Electronically Signed   By: Signa Kell M.D.   On: 02/12/2017 20:37    I have reviewed the patient's current medications.  Assessment/Plan: 1 TMA AdamsT pending.  For TPE today and q d x 7 then as indic 2 Anemia stable 3 Ptlt stable low no bleeding P TPE, steroids,      LOS: 1 day   Fayrene Fearing Maeby Vankleeck 02/13/2017,7:25 AM

## 2017-02-13 NOTE — Progress Notes (Signed)
PULMONARY / CRITICAL CARE MEDICINE   Name: Richard [name] MRN: [id number] DOB: 1990/06/08    ADMISSION DATE:  02/12/2017 CONSULTATION DATE:  02/12/2017  REFERRING MD:  Dr. Beryle Lathe and ED provider   CHIEF COMPLAINT:  Right sided hemiparesis   HISTORY OF PRESENT ILLNESS:   Richard [name] is a 59 yoM with sickle cell trait who presented to the [facility] ED for 9 hours of right sided paresthesia. As significant portion of the history was obtained from the chart and patients fiance as the patient is lethargic. He stated that around 2100 on 1/17 while at work he developed tingling and numbness of the entire right side of his body including his face with an associated headache. At that time he denied fever, chills, diplopia, vision changes, neck pain, dyspnea, abdominal pain, chest pain, confusion or generalized fatigue. He was recently placed on Tamiflu and amoxicillin for flulike symptoms the prior week but had been improving. The patient denies similar event previously.   In the ED CT and MRI of the head were negative for acute intracranial process. Initial CBC indicated a plt count of 16K and Hgb 5.8. LDH 1378, D-dimer 14.57, Retics 19.1%, with normal PT/INR/Fibrinogen 14.0/1.09/421 respectively. CMP was unremarkable except for slighytly elevated AST/ALT 61/64 with total bili of 2.7. At this point significant concern for TTP/HUS was noted with blood transfusion ordered. At this point he was determined to be in need of emergent plasmapheresis and was prepared for transfer to [facility] ICU. Upon arrival an HD catheter was placed in preparation of the procedure.    SUBJECTIVE:  Awake alert feels much better on 02/13/2017.   VITAL SIGNS: BP (!) 134/93   Pulse (!) 104   Temp 98.6 F (37 C) (Oral)   Resp (!) 27   Ht 5\' 11"  (1.803 m)   Wt 121.6 kg (268 lb)   SpO2 100%   BMI 37.38 kg/m   HEMODYNAMICS:    VENTILATOR SETTINGS:    INTAKE / OUTPUT: I/O last 3 completed shifts: In: 1780.8  [I.V.:306.3; Blood:972.5; IV Piggyback:502] Out: 1800 [Urine:1800]  PHYSICAL EXAMINATION: General: Well-nourished well-developed male in no acute distress HEENT: No JVD or lymphadenopathy is appreciated PSY: Normal affect Neuro: Intact follows commands no weakness noted CV: s1s2 rrr, no m/r/g PULM: even/non-labored, lungs bilaterally clear YQ:MVHQ, non-tender, bsx4 active  Extremities: warm/dry, negative edema  Skin: no rashes or lesions  LABS:  BMET Recent Labs  Lab 02/12/17 1422 02/12/17 1438 02/13/17 0410  NA 138 136 139  K 4.0 3.7 3.6  CL 102 105 103  CO2  --  24 24  BUN 11 13 12   CREATININE 1.10 1.09 1.07  GLUCOSE 139* 158* 148*    Electrolytes Recent Labs  Lab 02/12/17 1438 02/13/17 0410  CALCIUM 8.6* 8.6*  MG  --  1.6*    CBC Recent Labs  Lab 02/13/17 0425 02/13/17 0800 02/13/17 1119  WBC 31.6* 26.5* 28.7*  HGB 7.9* 8.0* 7.8*  HCT 23.3* 24.0* 23.4*  PLT 13* 25* 20*    Coag's Recent Labs  Lab 02/12/17 1416  INR 1.09    Sepsis Markers No results for input(s): LATICACIDVEN, PROCALCITON, O2SATVEN in the last 168 hours.  ABG No results for input(s): PHART, PCO2ART, PO2ART in the last 168 hours.  Liver Enzymes Recent Labs  Lab 02/12/17 1438  AST 61*  ALT 64*  ALKPHOS 78  BILITOT 2.7*  ALBUMIN 3.7    Cardiac Enzymes Recent Labs  Lab 02/12/17 2110 02/13/17 0410  TROPONINI 0.38* 0.30*    Glucose No results for input(s): GLUCAP in the last 168 hours.  Imaging Dg Chest 2 View  Result Date: 02/12/2017 CLINICAL DATA:  Shortness of Breath EXAM: CHEST  2 VIEW COMPARISON:  None. FINDINGS: Lungs are clear. Heart size and pulmonary vascularity are normal. No adenopathy. No bone lesions. IMPRESSION: No edema or consolidation. Electronically Signed   By: Bretta Bang III M.D.   On: 02/12/2017 14:23   Ct Angio Chest Pe W And/or Wo Contrast  Result Date: 02/12/2017 CLINICAL DATA:  Shortness of breath with exertion. Elevated D-dimer.  Evaluate for pulmonary embolism. EXAM: CT ANGIOGRAPHY CHEST WITH CONTRAST TECHNIQUE: Multidetector CT imaging of the chest was performed using the standard protocol during bolus administration of intravenous contrast. Multiplanar CT image reconstructions and MIPs were obtained to evaluate the vascular anatomy. CONTRAST:  ISOVUE-370 IOPAMIDOL (ISOVUE-370) INJECTION 76% COMPARISON:  Chest x-ray from same day. FINDINGS: Cardiovascular: Suboptimal opacification of the pulmonary arteries due to bolus timing and patient's body habitus. No central or lobar pulmonary embolism. Normal heart size. No pericardial effusion. Normal caliber thoracic aorta. Mediastinum/Nodes: No enlarged mediastinal, hilar, or axillary lymph nodes. Thyroid gland, trachea, and esophagus demonstrate no significant findings. Lungs/Pleura: Lungs are clear. No pleural effusion or pneumothorax. Upper Abdomen: No acute abnormality. Musculoskeletal: Bilateral gynecomastia. No acute or significant osseous findings. Review of the MIP images confirms the above findings. IMPRESSION: 1. No central or lobar pulmonary embolism. The segmental pulmonary arteries are not well evaluated due to bolus timing and patient's body habitus. 2. No acute intrathoracic process. Electronically Signed   By: Obie Dredge M.D.   On: 02/12/2017 16:06   Mr Brain Wo Contrast  Result Date: 02/12/2017 CLINICAL DATA:  Severe headache while at work last night. RIGHT-sided numbness. Ataxia, suspect stroke. History of sickle cell trait. EXAM: MRI HEAD WITHOUT CONTRAST TECHNIQUE: Axial and coronal diffusion weighted imaging, sagittal T1, axial T2 and axial T2 FLAIR obtained on a 1.5 tesla scanner. Patient was unable to tolerate further imaging. COMPARISON:  CT HEAD February 13, 2016 at 0709 hours. FINDINGS: Sequences are mildly motion degraded. BRAIN: No reduced diffusion to suggest acute ischemia, hyperacute demyelination or hypercellular tumor. No field inhomogeneity to  suggest blood products though susceptibility weighted sequence not obtained. No intrinsic T1 shortening. He ventricles and sulci are normal for patient's age. No suspicious parenchymal signal, mass or mass effect. No abnormal extra-axial fluid collections. VASCULAR: Normal major vascular flow voids present at skull base. SKULL AND UPPER CERVICAL SPINE: Mild prevertebral soft tissue fullness. No abnormal sellar expansion. No suspicious calvarial bone marrow signal. Craniocervical junction maintained. SINUSES/ORBITS: Included paranasal sinuses are well aerated. Imaged mastoid air cells are well aerated. The included ocular globes and orbital contents are non-suspicious. OTHER: None. IMPRESSION: 1. Limited motion degraded MRI, patient was unable to tolerate complete examination. 2. Negative noncontrast MRI of the head. 3. Prevertebral fullness, seen with recent viral illness or immunocompromised states. Electronically Signed   By: Awilda Metro M.D.   On: 02/12/2017 13:49   Dg Chest Port 1 View  Result Date: 02/12/2017 CLINICAL DATA:  Catheter placement. EXAM: PORTABLE CHEST 1 VIEW COMPARISON:  Earlier today FINDINGS: Interval placement of right IJ catheter with tip in the projection of the distal SVC. No pneumothorax visualized. The heart size and mediastinal contours are within normal limits. Both lungs are clear. The visualized skeletal structures are unremarkable. IMPRESSION: No active disease. Electronically Signed   By: Signa Kell M.D.   On: 02/12/2017  20:37   STUDIES:  MRI brain  1/18 CT Head 1/18 CT angio 1/18  CULTURES: Blood Cx 1/18 >>>  ANTIBIOTICS: Non indicated at this time  SIGNIFICANT EVENTS: Transfer[facility] to [facility] at [facility] 02/13/2016 first plasmapheresis initiated  LINES/TUBES: Right IJ HD cath 1/18>>> PIV 1/18>>>  DISCUSSION: [age] yoM with sickle cell trait who presented with right sided hemiparesis found to be with TTP with ADAMTS-13 pending although etiology is unclear  at this time. He was recently treated with amoxicillin and tamiflu for a flulike illness the previous week which are not commonly known to induce TTP. The patient was transferred to the ICU and prepared for plasmapheresis. He had received two units of blood.  02/13/2017 plasmapheresis is in progress.  ASSESSMENT / PLAN:  PULMONARY A: NO acute issues at this time. Dyspneic on admission  P:   Continue to monitor with SpO2 and vitals as indicated  CT Angiogram negative   CARDIOVASCULAR A:  Denied chest pain  EKG sinus tachycardia with no S1Q3T3 noted.  Initial isatat troponin 0.38 on admission  P:  First troponin is 0.38-second troponin is 0.3 most likely is demand ischemia Repeat EKG stat if chest pain or status change   RENAL Lab Results  Component Value Date   CREATININE 1.07 02/13/2017   CREATININE 1.09 02/12/2017   CREATININE 1.10 02/12/2017   Recent Labs  Lab 02/12/17 1422 02/12/17 1438 02/13/17 0410  K 4.0 3.7 3.6    A:   Renal function WNLs, Cr 1.09 Urine output maintaining, none documented in chart Given current diagnosis monitoring renal function is imperative  P:   Continue to monitor  BMP in am  Plasmapheresis ordered  GASTROINTESTINAL A:   NO acute GI concerns  NPO no tubes placed P:   Protonix for GI ppx Start diet 02/13/2017 HEMATOLOGIC Recent Labs    02/13/17 0800 02/13/17 1119  HGB 8.0* 7.8*    A:   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  ADAMTS-13 ordered and pending Plt count of 16K and Hgb 5.8. LDH 1,378 Coombs negative DIC negative with PT/INR/Fibrinogen 14.0/1.09/421 respectively Patient denied drugs of abuse including Cocaine  P:  Given two units of blood Typed and screen ordered Plasmapheresis with 1 episode completed and to have another 02/13/2017 Follow CBC Hematology is following  INFECTIOUS A:   No acute indication of infection at this time Patient is afebrile, hypertensive  on admission,  Leukocytosis 22.0 on admission, HR >90, tachypneic, this can be explained by the patients TTP P:   Will monitor for signs of infection No indication for antibiotics  Blood culture ordered   UA pending   ENDOCRINE A:   No acute endocrine conditions noted   P:   No treatment indicated at this time  NEUROLOGIC A:   Patient is alert and answering questions and mentally intact P:   02/13/2017 pain is much improved  FAMILY  - Updates: Patient and family updated at bedside 02/13/2017  - Inter-disciplinary family meet or Palliative Care meeting due by:  day 7  02/13/2017 may be ready to transfer to floor within 24-48 hours.  Brett CanalesSteve Abdulrahim Siddiqi ACNP [facility] Pager [phone] till 1 pm If no answer page [phone] 02/13/2017, 12:02 PM

## 2017-02-13 NOTE — Progress Notes (Signed)
Marland Kitchen   HEMATOLOGY/ONCOLOGY INPATIENT PROGRESS NOTE  Date of Service: 02/13/2017  Inpatient Attending: .Scatliffe, Gypsy Balsam, MD   SUBJECTIVE  The patient is more awake and alert this morning.  No focal neurological deficits.  No chest pain.  No shortness of breath.  Notes some petechiae.  No other overt bleeding.  Diarrhea resolving. Tolerated first session of plasma exchange without any acute issues. Robust reticulocytosis.  Hemoglobin at 8 today.  Platelet starting to improve at 25k this morning. No issues with tolerating high-dose steroids.  OBJECTIVE:  NAD  PHYSICAL EXAMINATION: . Vitals:   02/13/17 0600 02/13/17 0700 02/13/17 0831 02/13/17 0900  BP: 135/82 (!) 105/48 (!) 152/83 (!) 146/72  Pulse: (!) 103 97 (!) 107 (!) 103  Resp: 16 20 (!) 25 16  Temp:      TempSrc:      SpO2: 98% 98% 95% 97%  Weight:      Height:       Filed Weights   02/12/17 0127 02/12/17 1827 02/13/17 0500  Weight: 273 lb (123.8 kg) 269 lb 10 oz (122.3 kg) 268 lb (121.6 kg)   .Body mass index is 37.38 kg/m.  GENERAL:alert, in no acute distress and comfortable SKIN: few petechiae/small ecchymosis on extremities EYES: normal, conjunctiva are pink and non-injected, sclera clear OROPHARYNX:no exudate, no erythema and lips, buccal mucosa, and tongue normal  NECK: supple, no JVD, thyroid normal size, non-tender, without nodularity LYMPH:  no palpable lymphadenopathy in the cervical, axillary or inguinal LUNGS: clear to auscultation with normal respiratory effort HEART: regular rate & rhythm,  no murmurs and no lower extremity edema ABDOMEN: abdomen soft, non-tender, normoactive bowel sounds  Musculoskeletal: no cyanosis of digits and no clubbing  PSYCH: alert & oriented x 3 with fluent speech NEURO: no focal motor/sensory deficits  MEDICAL HISTORY:  Past Medical History:  Diagnosis Date  . Sickle cell trait (HCC)     SURGICAL HISTORY: History reviewed. No pertinent surgical  history.  SOCIAL HISTORY: Social History   Socioeconomic History  . Marital status: Single    Spouse name: Not on file  . Number of children: Not on file  . Years of education: Not on file  . Highest education level: Not on file  Social Needs  . Financial resource strain: Not on file  . Food insecurity - worry: Not on file  . Food insecurity - inability: Not on file  . Transportation needs - medical: Not on file  . Transportation needs - non-medical: Not on file  Occupational History  . Not on file  Tobacco Use  . Smoking status: Current Every Day Smoker  . Smokeless tobacco: Never Used  Substance and Sexual Activity  . Alcohol use: Yes    Comment: occ  . Drug use: No  . Sexual activity: Not on file  Other Topics Concern  . Not on file  Social History Narrative  . Not on file    FAMILY HISTORY: Family History  Problem Relation Age of Onset  . Cancer Other   . Diabetes Other   . Hypertension Other     ALLERGIES:  has No Known Allergies.  MEDICATIONS:  Scheduled Meds: . B-complex with vitamin C  1 tablet Oral Daily  . calcium carbonate  2 tablet Oral Q3H  . folic acid  2 mg Oral Daily  . pantoprazole  40 mg Oral Daily   Continuous Infusions: . sodium chloride 250 mL (02/13/17 0600)  . sodium chloride    . anticoagulant sodium citrate    .  calcium gluconate IVPB    . citrate dextrose    . methylPREDNISolone (SOLU-MEDROL) injection Stopped (02/13/17 0445)   PRN Meds:.sodium chloride, sodium chloride, acetaminophen, diphenhydrAMINE  REVIEW OF SYSTEMS:    10 Point review of Systems was done is negative except as noted above.   LABORATORY DATA:  I have reviewed the data as listed  . CBC Latest Ref Rng & Units 02/13/2017 02/13/2017 02/13/2017  WBC 4.0 - 10.5 K/uL 26.5(H) 31.6(H) 30.4(H)  Hemoglobin 13.0 - 17.0 g/dL 8.0(L) 7.9(L) 7.7(L)  Hematocrit 39.0 - 52.0 % 24.0(L) 23.3(L) 23.5(L)  Platelets 150 - 400 K/uL 25(LL) 13(LL) 13(LL)    . CMP Latest Ref  Rng & Units 02/13/2017 02/12/2017 02/12/2017  Glucose 65 - 99 mg/dL 161(W) 960(A) 540(J)  BUN 6 - 20 mg/dL 12 13 11   Creatinine 0.61 - 1.24 mg/dL 8.11 9.14 7.82  Sodium 135 - 145 mmol/L 139 136 138  Potassium 3.5 - 5.1 mmol/L 3.6 3.7 4.0  Chloride 101 - 111 mmol/L 103 105 102  CO2 22 - 32 mmol/L 24 24 -  Calcium 8.9 - 10.3 mg/dL 9.5(A) 2.1(H) -  Total Protein 6.5 - 8.1 g/dL - 7.0 -  Total Bilirubin 0.3 - 1.2 mg/dL - 2.7(H) -  Alkaline Phos 38 - 126 U/L - 78 -  AST 15 - 41 U/L - 61(H) -  ALT 17 - 63 U/L - 64(H) -   ADAM TS 13 --levels pending      RADIOGRAPHIC STUDIES: I have personally reviewed the radiological images as listed and agreed with the findings in the report. Dg Chest 2 View  Result Date: 02/12/2017 CLINICAL DATA:  Shortness of Breath EXAM: CHEST  2 VIEW COMPARISON:  None. FINDINGS: Lungs are clear. Heart size and pulmonary vascularity are normal. No adenopathy. No bone lesions. IMPRESSION: No edema or consolidation. Electronically Signed   By: Bretta Bang III M.D.   On: 02/12/2017 14:23   Ct Head Wo Contrast  Result Date: 02/12/2017 CLINICAL DATA:  Right-sided numbness. History of sickle cell disease EXAM: CT HEAD WITHOUT CONTRAST TECHNIQUE: Contiguous axial images were obtained from the base of the skull through the vertex without intravenous contrast. COMPARISON:  None. FINDINGS: Brain: The ventricles are normal in size and configuration. There is no intracranial mass, hemorrhage, extra-axial fluid collection, or midline shift. Gray-white compartments are normal. No acute infarct evident. Vascular: No hyperdense vessels.  No vascular calcification evident. Skull: The bony calvarium appears intact. Sinuses/Orbits: Visualized paranasal sinuses are clear. Orbits appear symmetric bilaterally. Other: Mastoid air cells are clear. IMPRESSION: Study within normal limits. Electronically Signed   By: Bretta Bang III M.D.   On: 02/12/2017 07:34   Ct Angio Chest Pe W And/or  Wo Contrast  Result Date: 02/12/2017 CLINICAL DATA:  Shortness of breath with exertion. Elevated D-dimer. Evaluate for pulmonary embolism. EXAM: CT ANGIOGRAPHY CHEST WITH CONTRAST TECHNIQUE: Multidetector CT imaging of the chest was performed using the standard protocol during bolus administration of intravenous contrast. Multiplanar CT image reconstructions and MIPs were obtained to evaluate the vascular anatomy. CONTRAST:  ISOVUE-370 IOPAMIDOL (ISOVUE-370) INJECTION 76% COMPARISON:  Chest x-ray from same day. FINDINGS: Cardiovascular: Suboptimal opacification of the pulmonary arteries due to bolus timing and patient's body habitus. No central or lobar pulmonary embolism. Normal heart size. No pericardial effusion. Normal caliber thoracic aorta. Mediastinum/Nodes: No enlarged mediastinal, hilar, or axillary lymph nodes. Thyroid gland, trachea, and esophagus demonstrate no significant findings. Lungs/Pleura: Lungs are clear. No pleural effusion or pneumothorax. Upper Abdomen:  No acute abnormality. Musculoskeletal: Bilateral gynecomastia. No acute or significant osseous findings. Review of the MIP images confirms the above findings. IMPRESSION: 1. No central or lobar pulmonary embolism. The segmental pulmonary arteries are not well evaluated due to bolus timing and patient's body habitus. 2. No acute intrathoracic process. Electronically Signed   By: Obie DredgeWilliam T Derry M.D.   On: 02/12/2017 16:06   Mr Brain Wo Contrast  Result Date: 02/12/2017 CLINICAL DATA:  Severe headache while at work last night. RIGHT-sided numbness. Ataxia, suspect stroke. History of sickle cell trait. EXAM: MRI HEAD WITHOUT CONTRAST TECHNIQUE: Axial and coronal diffusion weighted imaging, sagittal T1, axial T2 and axial T2 FLAIR obtained on a 1.5 tesla scanner. Patient was unable to tolerate further imaging. COMPARISON:  CT HEAD February 13, 2016 at 0709 hours. FINDINGS: Sequences are mildly motion degraded. BRAIN: No reduced diffusion  to suggest acute ischemia, hyperacute demyelination or hypercellular tumor. No field inhomogeneity to suggest blood products though susceptibility weighted sequence not obtained. No intrinsic T1 shortening. He ventricles and sulci are normal for patient's age. No suspicious parenchymal signal, mass or mass effect. No abnormal extra-axial fluid collections. VASCULAR: Normal major vascular flow voids present at skull base. SKULL AND UPPER CERVICAL SPINE: Mild prevertebral soft tissue fullness. No abnormal sellar expansion. No suspicious calvarial bone marrow signal. Craniocervical junction maintained. SINUSES/ORBITS: Included paranasal sinuses are well aerated. Imaged mastoid air cells are well aerated. The included ocular globes and orbital contents are non-suspicious. OTHER: None. IMPRESSION: 1. Limited motion degraded MRI, patient was unable to tolerate complete examination. 2. Negative noncontrast MRI of the head. 3. Prevertebral fullness, seen with recent viral illness or immunocompromised states. Electronically Signed   By: Awilda Metroourtnay  Bloomer M.D.   On: 02/12/2017 13:49   Dg Chest Port 1 View  Result Date: 02/12/2017 CLINICAL DATA:  Catheter placement. EXAM: PORTABLE CHEST 1 VIEW COMPARISON:  Earlier today FINDINGS: Interval placement of right IJ catheter with tip in the projection of the distal SVC. No pneumothorax visualized. The heart size and mediastinal contours are within normal limits. Both lungs are clear. The visualized skeletal structures are unremarkable. IMPRESSION: No active disease. Electronically Signed   By: Signa Kellaylor  Stroud M.D.   On: 02/12/2017 20:37    ASSESSMENT & PLAN:   27 y.o. male with hx of sickle cell trait  1. 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 Hemoglobin is down to 5.8 with elevated LDH of 1300s. LDH improved to 700's. hgb up to 8. Robust reticulocytosis.  2. 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  Platelets were 16k on initial  presentation. Improved to 25k this AM With no overt bleeding noted at this time.  3. Left sided mild weakness/numbness - resolved No overt CVA at this time an MRI.  Patient's clinical presentation anemia and thrombocytopenia along with peripheral blood smear showing significant increased schistocytes and with left sided neurological symptoms is consistent with a TTP-HUS spectrum disorder. No significant renal failure at this time.  Patient had a recent upper respiratory infection and diarrhea which could be potential trigger events. Given lack of renal involvement this would be more consistent with a TTP-like picture.  Plan -Patient more awake today and completely alert and oriented.  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 -Please confirm that stat ADAMTS 13 level -ordered on presentation has been sent appropriately. -daily plasma exchange till LDH and platelets normalize. -Will treat the patient with IV solu-medrol 250  mg q 6 x 12 doses with GI prophylaxis then switch to Prednisone 60mg  po daily and will taper over 2-3weeks - PRBC transfusion prn to maintain Hgb >7.5 -would hold off on Platelet transfusions unless patient actively bleeding. - folic acid 2 mg, B12 1000 mcg daily -HIV test -pending -Hgb electrophoresis -pending -daily cbc with diff and reticulocyte counts, LDH, cmp  4. URI symptoms  -Viral respiratory panel-pending -Droplet precautions  5. Diarrhea -If persistent would get C. difficile and stool studies. -Patient recently was treated with antibiotics for URI.  Hematology will continue to follow daily Aprpeciate help from critical and nephrology.    I spent 25 minutes counseling the patient face to face. The total time spent in the appointment was 35 minutes and more than 50% was on counseling and direct patient cares.    Wyvonnia Lora MD  MS AAHIVMS [facility] Hematology/Oncology Physician [facility]  (Office):       [phone] (Work cell):  [phone] (Fax):           [phone]  02/13/2017 10:45 AM

## 2017-02-14 ENCOUNTER — Inpatient Hospital Stay (HOSPITAL_COMMUNITY): Payer: Managed Care, Other (non HMO)

## 2017-02-14 DIAGNOSIS — I361 Nonrheumatic tricuspid (valve) insufficiency: Secondary | ICD-10-CM

## 2017-02-14 LAB — THERAPEUTIC PLASMA EXCHANGE (BLOOD BANK)
Plasma Exchange: 4360
Plasma volume needed: 4360
UNIT DIVISION: 0
UNIT DIVISION: 0
UNIT DIVISION: 0
Unit division: 0
Unit division: 0
Unit division: 0
Unit division: 0
Unit division: 0
Unit division: 0
Unit division: 0
Unit division: 0
Unit division: 0
Unit division: 0
Unit division: 0
Unit division: 0
Unit division: 0

## 2017-02-14 LAB — CBC WITH DIFFERENTIAL/PLATELET
BASOS ABS: 0 10*3/uL (ref 0.0–0.1)
BASOS PCT: 0 %
EOS ABS: 0 10*3/uL (ref 0.0–0.7)
Eosinophils Relative: 0 %
HCT: 23.6 % — ABNORMAL LOW (ref 39.0–52.0)
Hemoglobin: 8 g/dL — ABNORMAL LOW (ref 13.0–17.0)
LYMPHS PCT: 13 %
Lymphs Abs: 4.5 10*3/uL — ABNORMAL HIGH (ref 0.7–4.0)
MCH: 32.5 pg (ref 26.0–34.0)
MCHC: 33.9 g/dL (ref 30.0–36.0)
MCV: 95.9 fL (ref 78.0–100.0)
Monocytes Absolute: 1.7 10*3/uL — ABNORMAL HIGH (ref 0.1–1.0)
Monocytes Relative: 5 %
NEUTROS PCT: 82 %
Neutro Abs: 28.7 10*3/uL — ABNORMAL HIGH (ref 1.7–7.7)
PLATELETS: 74 10*3/uL — AB (ref 150–400)
RBC: 2.46 MIL/uL — ABNORMAL LOW (ref 4.22–5.81)
RDW: 23.9 % — ABNORMAL HIGH (ref 11.5–15.5)
WBC: 34.9 10*3/uL — ABNORMAL HIGH (ref 4.0–10.5)

## 2017-02-14 LAB — BASIC METABOLIC PANEL
ANION GAP: 10 (ref 5–15)
BUN: 17 mg/dL (ref 6–20)
CO2: 26 mmol/L (ref 22–32)
Calcium: 8.9 mg/dL (ref 8.9–10.3)
Chloride: 104 mmol/L (ref 101–111)
Creatinine, Ser: 1.05 mg/dL (ref 0.61–1.24)
Glucose, Bld: 148 mg/dL — ABNORMAL HIGH (ref 65–99)
POTASSIUM: 4 mmol/L (ref 3.5–5.1)
SODIUM: 140 mmol/L (ref 135–145)

## 2017-02-14 LAB — PHOSPHORUS: Phosphorus: 4.2 mg/dL (ref 2.5–4.6)

## 2017-02-14 LAB — POCT I-STAT, CHEM 8
BUN: 11 mg/dL (ref 6–20)
BUN: 14 mg/dL (ref 6–20)
CALCIUM ION: 1.18 mmol/L (ref 1.15–1.40)
CREATININE: 1 mg/dL (ref 0.61–1.24)
Calcium, Ion: 0.87 mmol/L — CL (ref 1.15–1.40)
Chloride: 105 mmol/L (ref 101–111)
Chloride: 99 mmol/L — ABNORMAL LOW (ref 101–111)
Creatinine, Ser: 1 mg/dL (ref 0.61–1.24)
GLUCOSE: 139 mg/dL — AB (ref 65–99)
GLUCOSE: 167 mg/dL — AB (ref 65–99)
HCT: 24 % — ABNORMAL LOW (ref 39.0–52.0)
HEMATOCRIT: 23 % — AB (ref 39.0–52.0)
HEMOGLOBIN: 8.2 g/dL — AB (ref 13.0–17.0)
HEMOGLOBIN: 7.8 g/dL — AB (ref 13.0–17.0)
POTASSIUM: 4.2 mmol/L (ref 3.5–5.1)
POTASSIUM: 3.6 mmol/L (ref 3.5–5.1)
SODIUM: 139 mmol/L (ref 135–145)
Sodium: 140 mmol/L (ref 135–145)
TCO2: 23 mmol/L (ref 22–32)
TCO2: 25 mmol/L (ref 22–32)

## 2017-02-14 LAB — URINE CULTURE

## 2017-02-14 LAB — RETICULOCYTES
RBC.: 2.46 MIL/uL — ABNORMAL LOW (ref 4.22–5.81)
RETIC COUNT ABSOLUTE: 418.2 10*3/uL — AB (ref 19.0–186.0)
Retic Ct Pct: 17 % — ABNORMAL HIGH (ref 0.4–3.1)

## 2017-02-14 LAB — HAPTOGLOBIN: Haptoglobin: <10 mg/dL — ABNORMAL LOW (ref 34–200)

## 2017-02-14 LAB — MAGNESIUM: MAGNESIUM: 2 mg/dL (ref 1.7–2.4)

## 2017-02-14 LAB — LACTATE DEHYDROGENASE: LDH: 389 U/L — AB (ref 98–192)

## 2017-02-14 MED ORDER — DIPHENHYDRAMINE HCL 25 MG PO CAPS
25.0000 mg | ORAL_CAPSULE | Freq: Four times a day (QID) | ORAL | Status: DC | PRN
  Administered 2017-02-14: 25 mg via ORAL

## 2017-02-14 MED ORDER — ACD FORMULA A 0.73-2.45-2.2 GM/100ML VI SOLN
Status: AC
  Administered 2017-02-14: 500 mL
  Filled 2017-02-14: qty 1000

## 2017-02-14 MED ORDER — ACETAMINOPHEN 325 MG PO TABS
ORAL_TABLET | ORAL | Status: AC
  Filled 2017-02-14: qty 2

## 2017-02-14 MED ORDER — ANTICOAGULANT SODIUM CITRATE 4% (200MG/5ML) IV SOLN
5.0000 mL | Freq: Once | Status: AC
  Administered 2017-02-14: 5 mL
  Filled 2017-02-14: qty 250

## 2017-02-14 MED ORDER — CALCIUM CARBONATE ANTACID 500 MG PO CHEW
CHEWABLE_TABLET | ORAL | Status: AC
  Administered 2017-02-14: 400 mg
  Filled 2017-02-14: qty 2

## 2017-02-14 MED ORDER — ACD FORMULA A 0.73-2.45-2.2 GM/100ML VI SOLN
500.0000 mL | Status: DC
  Filled 2017-02-14: qty 500

## 2017-02-14 MED ORDER — ACETAMINOPHEN 325 MG PO TABS
650.0000 mg | ORAL_TABLET | ORAL | Status: DC | PRN
  Administered 2017-02-14: 650 mg via ORAL

## 2017-02-14 MED ORDER — DIPHENHYDRAMINE HCL 25 MG PO CAPS
ORAL_CAPSULE | ORAL | Status: AC
  Filled 2017-02-14: qty 1

## 2017-02-14 MED ORDER — SODIUM CHLORIDE 0.9 % IV SOLN
2.0000 g | Freq: Once | INTRAVENOUS | Status: AC
  Administered 2017-02-14: 2 g via INTRAVENOUS
  Filled 2017-02-14: qty 20

## 2017-02-14 NOTE — Progress Notes (Signed)
Marland Kitchen   HEMATOLOGY/ONCOLOGY INPATIENT PROGRESS NOTE  Date of Service: 02/14/2017  Inpatient Attending: .Scatliffe, Gypsy Balsam, MD   SUBJECTIVE  Patient notes no acute new symptoms. No FND. Mother arrived from [place] and is at bedside. Updated regarding patients condition. Platelets have improved to 74k. No bleeding issues. hgb stable at 8. Tolerating plasma exchange. LDH downtrending appropriately.  OBJECTIVE:  NAD  PHYSICAL EXAMINATION: . Vitals:   02/14/17 1353 02/14/17 1401 02/14/17 1409 02/14/17 1415  BP: 117/70 (!) 111/59 110/60 111/64  Pulse: (!) 105 (!) 104 (!) 105 (!) 107  Resp: 20 20 20 20   Temp: 98 F (36.7 C) 98 F (36.7 C) 98 F (36.7 C) 98 F (36.7 C)  TempSrc: Oral Oral Oral Oral  SpO2:      Weight:      Height:       Filed Weights   02/12/17 1827 02/13/17 0500 02/14/17 0400  Weight: 269 lb 10 oz (122.3 kg) 268 lb (121.6 kg) 273 lb (123.8 kg)   .Body mass index is 38.08 kg/m.  GENERAL:alert, in no acute distress and comfortable SKIN: few petechiae/small ecchymosis on extremities EYES: normal, conjunctiva are pink and non-injected, sclera clear OROPHARYNX:no exudate, no erythema and lips, buccal mucosa, and tongue normal  NECK: supple, no JVD, thyroid normal size, non-tender, without nodularity LYMPH:  no palpable lymphadenopathy in the cervical, axillary or inguinal LUNGS: clear to auscultation with normal respiratory effort HEART: regular rate & rhythm,  no murmurs and no lower extremity edema ABDOMEN: abdomen soft, non-tender, normoactive bowel sounds  Musculoskeletal: no cyanosis of digits and no clubbing  PSYCH: alert & oriented x 3 with fluent speech NEURO: no focal motor/sensory deficits  MEDICAL HISTORY:  Past Medical History:  Diagnosis Date  . Sickle cell trait (HCC)     SURGICAL HISTORY: History reviewed. No pertinent surgical history.  SOCIAL HISTORY: Social History   Socioeconomic History  . Marital status: Single    Spouse  name: Not on file  . Number of children: Not on file  . Years of education: Not on file  . Highest education level: Not on file  Social Needs  . Financial resource strain: Not on file  . Food insecurity - worry: Not on file  . Food insecurity - inability: Not on file  . Transportation needs - medical: Not on file  . Transportation needs - non-medical: Not on file  Occupational History  . Not on file  Tobacco Use  . Smoking status: Current Every Day Smoker  . Smokeless tobacco: Never Used  Substance and Sexual Activity  . Alcohol use: Yes    Comment: occ  . Drug use: No  . Sexual activity: Not on file  Other Topics Concern  . Not on file  Social History Narrative  . Not on file    FAMILY HISTORY: Family History  Problem Relation Age of Onset  . Cancer Other   . Diabetes Other   . Hypertension Other     ALLERGIES:  has No Known Allergies.  MEDICATIONS:  Scheduled Meds: . B-complex with vitamin C  1 tablet Oral Daily  . folic acid  2 mg Oral Daily  . pantoprazole  40 mg Oral Daily   Continuous Infusions: . sodium chloride 250 mL (02/13/17 0600)  . sodium chloride    . methylPREDNISolone (SOLU-MEDROL) injection Stopped (02/14/17 0506)   PRN Meds:.sodium chloride, sodium chloride  REVIEW OF SYSTEMS:    10 Point review of Systems was done is negative except as  noted above.   LABORATORY DATA:  I have reviewed the data as listed  . CBC Latest Ref Rng & Units 02/14/2017 02/13/2017 02/13/2017  WBC 4.0 - 10.5 K/uL 34.9(H) - 28.7(H)  Hemoglobin 13.0 - 17.0 g/dL 8.0(L) 7.8(L) 7.8(L)  Hematocrit 39.0 - 52.0 % 23.6(L) 23.0(L) 23.4(L)  Platelets 150 - 400 K/uL 74(L) - 20(LL)    . CMP Latest Ref Rng & Units 02/14/2017 02/13/2017 02/13/2017  Glucose 65 - 99 mg/dL 161(W) 960(A) 540(J)  BUN 6 - 20 mg/dL 17 14 12   Creatinine 0.61 - 1.24 mg/dL 8.11 9.14 7.82  Sodium 135 - 145 mmol/L 140 140 139  Potassium 3.5 - 5.1 mmol/L 4.0 3.6 3.6  Chloride 101 - 111 mmol/L 104 99(L)  103  CO2 22 - 32 mmol/L 26 - 24  Calcium 8.9 - 10.3 mg/dL 8.9 - 8.6(L)  Total Protein 6.5 - 8.1 g/dL - - -  Total Bilirubin 0.3 - 1.2 mg/dL - - -  Alkaline Phos 38 - 126 U/L - - -  AST 15 - 41 U/L - - -  ALT 17 - 63 U/L - - -   ADAM TS 13 --levels pending      RADIOGRAPHIC STUDIES: I have personally reviewed the radiological images as listed and agreed with the findings in the report. Dg Chest 2 View  Result Date: 02/12/2017 CLINICAL DATA:  Shortness of Breath EXAM: CHEST  2 VIEW COMPARISON:  None. FINDINGS: Lungs are clear. Heart size and pulmonary vascularity are normal. No adenopathy. No bone lesions. IMPRESSION: No edema or consolidation. Electronically Signed   By: Bretta Bang III M.D.   On: 02/12/2017 14:23   Ct Head Wo Contrast  Result Date: 02/12/2017 CLINICAL DATA:  Right-sided numbness. History of sickle cell disease EXAM: CT HEAD WITHOUT CONTRAST TECHNIQUE: Contiguous axial images were obtained from the base of the skull through the vertex without intravenous contrast. COMPARISON:  None. FINDINGS: Brain: The ventricles are normal in size and configuration. There is no intracranial mass, hemorrhage, extra-axial fluid collection, or midline shift. Gray-white compartments are normal. No acute infarct evident. Vascular: No hyperdense vessels.  No vascular calcification evident. Skull: The bony calvarium appears intact. Sinuses/Orbits: Visualized paranasal sinuses are clear. Orbits appear symmetric bilaterally. Other: Mastoid air cells are clear. IMPRESSION: Study within normal limits. Electronically Signed   By: Bretta Bang III M.D.   On: 02/12/2017 07:34   Ct Angio Chest Pe W And/or Wo Contrast  Result Date: 02/12/2017 CLINICAL DATA:  Shortness of breath with exertion. Elevated D-dimer. Evaluate for pulmonary embolism. EXAM: CT ANGIOGRAPHY CHEST WITH CONTRAST TECHNIQUE: Multidetector CT imaging of the chest was performed using the standard protocol during bolus  administration of intravenous contrast. Multiplanar CT image reconstructions and MIPs were obtained to evaluate the vascular anatomy. CONTRAST:  ISOVUE-370 IOPAMIDOL (ISOVUE-370) INJECTION 76% COMPARISON:  Chest x-ray from same day. FINDINGS: Cardiovascular: Suboptimal opacification of the pulmonary arteries due to bolus timing and patient's body habitus. No central or lobar pulmonary embolism. Normal heart size. No pericardial effusion. Normal caliber thoracic aorta. Mediastinum/Nodes: No enlarged mediastinal, hilar, or axillary lymph nodes. Thyroid gland, trachea, and esophagus demonstrate no significant findings. Lungs/Pleura: Lungs are clear. No pleural effusion or pneumothorax. Upper Abdomen: No acute abnormality. Musculoskeletal: Bilateral gynecomastia. No acute or significant osseous findings. Review of the MIP images confirms the above findings. IMPRESSION: 1. No central or lobar pulmonary embolism. The segmental pulmonary arteries are not well evaluated due to bolus timing and patient's body habitus. 2.  No acute intrathoracic process. Electronically Signed   By: Obie Dredge M.D.   On: 02/12/2017 16:06   Mr Brain Wo Contrast  Result Date: 02/12/2017 CLINICAL DATA:  Severe headache while at work last night. RIGHT-sided numbness. Ataxia, suspect stroke. History of sickle cell trait. EXAM: MRI HEAD WITHOUT CONTRAST TECHNIQUE: Axial and coronal diffusion weighted imaging, sagittal T1, axial T2 and axial T2 FLAIR obtained on a 1.5 tesla scanner. Patient was unable to tolerate further imaging. COMPARISON:  CT HEAD February 13, 2016 at 0709 hours. FINDINGS: Sequences are mildly motion degraded. BRAIN: No reduced diffusion to suggest acute ischemia, hyperacute demyelination or hypercellular tumor. No field inhomogeneity to suggest blood products though susceptibility weighted sequence not obtained. No intrinsic T1 shortening. He ventricles and sulci are normal for patient's age. No suspicious  parenchymal signal, mass or mass effect. No abnormal extra-axial fluid collections. VASCULAR: Normal major vascular flow voids present at skull base. SKULL AND UPPER CERVICAL SPINE: Mild prevertebral soft tissue fullness. No abnormal sellar expansion. No suspicious calvarial bone marrow signal. Craniocervical junction maintained. SINUSES/ORBITS: Included paranasal sinuses are well aerated. Imaged mastoid air cells are well aerated. The included ocular globes and orbital contents are non-suspicious. OTHER: None. IMPRESSION: 1. Limited motion degraded MRI, patient was unable to tolerate complete examination. 2. Negative noncontrast MRI of the head. 3. Prevertebral fullness, seen with recent viral illness or immunocompromised states. Electronically Signed   By: Awilda Metro M.D.   On: 02/12/2017 13:49   Dg Chest Port 1 View  Result Date: 02/12/2017 CLINICAL DATA:  Catheter placement. EXAM: PORTABLE CHEST 1 VIEW COMPARISON:  Earlier today FINDINGS: Interval placement of right IJ catheter with tip in the projection of the distal SVC. No pneumothorax visualized. The heart size and mediastinal contours are within normal limits. Both lungs are clear. The visualized skeletal structures are unremarkable. IMPRESSION: No active disease. Electronically Signed   By: Signa Kell M.D.   On: 02/12/2017 20:37    ASSESSMENT & PLAN:   27 y.o. 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 On presentation hemoglobin is down to 5.8 with elevated LDH of 1300s. LDH improved to 300's. hgb up to 8. Robust reticulocytosis.  2. 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  Platelets were 16k on initial presentation. Improved to 74k this AM With no overt bleeding noted at this time.  3. Left sided mild weakness/numbness - resolved No overt CVA at this time an MRI.  Plan -pending ADAMTS 13  levels -updated his mother at bedside. -daily plasma exchange till LDH and platelets normalize. -Will treat the patient with IV solu-medrol 250 mg q 6 x 12 doses with GI prophylaxis then switch to Prednisone 60mg  po daily and will taper over 2-3weeks - PRBC transfusion prn to maintain Hgb >7.5 -would hold off on Platelet transfusions unless patient actively bleeding. - folic acid 2 mg, B12 1000 mcg daily -HIV test -non reactive -Hgb electrophoresis -pending -daily cbc with diff and reticulocyte counts, LDH, cmp  4. URI symptoms  -Viral respiratory panel - neg  5. Elevated ferritin on admission - due to overt hemolysis.  Hematology will continue to follow daily Aprpeciate help from critical and nephrology.    I spent 20 minutes counseling the patient face to face. The total time spent in the appointment was 25 minutes and more than 50% was on counseling and direct patient cares.    Rosemond Lyttle  Candise CheKale MD MS AAHIVMS [facility] Hematology/Oncology Physician [facility]  (Office):       [phone] (Work cell):  [phone] (Fax):           [phone]  02/14/2017 9:57 AM

## 2017-02-14 NOTE — Progress Notes (Signed)
Subjective: Interval History: has no complaint, feeling better, no weakness.  Objective: Vital signs in last 24 hours: Temp:  [98 F (36.7 C)-98.6 F (37 C)] 98.2 F (36.8 C) (01/20 0700) Pulse Rate:  [92-125] 92 (01/20 0815) Resp:  [12-33] 17 (01/20 0815) BP: (105-146)/(46-93) 132/63 (01/20 0815) SpO2:  [95 %-100 %] 95 % (01/20 0815) Weight:  [123.8 kg (273 lb)] 123.8 kg (273 lb) (01/20 0400) Weight change: 1.532 kg (3 lb 6 oz)  Intake/Output from previous day: 01/19 0701 - 01/20 0700 In: 1254 [P.O.:960; I.V.:90; IV Piggyback:204] Out: 300 [Urine:300] Intake/Output this shift: Total I/O In: 240 [P.O.:240] Out: -   General appearance: alert, cooperative, no distress and mildly obese Neck: RIJ cath Resp: clear to auscultation bilaterally Cardio: S1, S2 normal and systolic murmur: holosystolic 2/6, blowing at apex GI: obese, pos bs Extremities: extremities normal, atraumatic, no cyanosis or edema  Lab Results: Recent Labs    02/13/17 1119 02/14/17 0427  WBC 28.7* 34.9*  HGB 7.8* 8.0*  HCT 23.4* 23.6*  PLT 20* 74*   BMET:  Recent Labs    02/13/17 0410 02/14/17 0427  NA 139 140  K 3.6 4.0  CL 103 104  CO2 24 26  GLUCOSE 148* 148*  BUN 12 17  CREATININE 1.07 1.05  CALCIUM 8.6* 8.9   No results for input(s): PTH in the last 72 hours. Iron Studies:  Recent Labs    02/12/17 1441  IRON 209*  TIBC 304  FERRITIN 1,230*    Studies/Results: Dg Chest 2 View  Result Date: 02/12/2017 CLINICAL DATA:  Shortness of Breath EXAM: CHEST  2 VIEW COMPARISON:  None. FINDINGS: Lungs are clear. Heart size and pulmonary vascularity are normal. No adenopathy. No bone lesions. IMPRESSION: No edema or consolidation. Electronically Signed   By: Bretta Bang III M.D.   On: 02/12/2017 14:23   Ct Angio Chest Pe W And/or Wo Contrast  Result Date: 02/12/2017 CLINICAL DATA:  Shortness of breath with exertion. Elevated D-dimer. Evaluate for pulmonary embolism. EXAM: CT  ANGIOGRAPHY CHEST WITH CONTRAST TECHNIQUE: Multidetector CT imaging of the chest was performed using the standard protocol during bolus administration of intravenous contrast. Multiplanar CT image reconstructions and MIPs were obtained to evaluate the vascular anatomy. CONTRAST:  ISOVUE-370 IOPAMIDOL (ISOVUE-370) INJECTION 76% COMPARISON:  Chest x-ray from same day. FINDINGS: Cardiovascular: Suboptimal opacification of the pulmonary arteries due to bolus timing and patient's body habitus. No central or lobar pulmonary embolism. Normal heart size. No pericardial effusion. Normal caliber thoracic aorta. Mediastinum/Nodes: No enlarged mediastinal, hilar, or axillary lymph nodes. Thyroid gland, trachea, and esophagus demonstrate no significant findings. Lungs/Pleura: Lungs are clear. No pleural effusion or pneumothorax. Upper Abdomen: No acute abnormality. Musculoskeletal: Bilateral gynecomastia. No acute or significant osseous findings. Review of the MIP images confirms the above findings. IMPRESSION: 1. No central or lobar pulmonary embolism. The segmental pulmonary arteries are not well evaluated due to bolus timing and patient's body habitus. 2. No acute intrathoracic process. Electronically Signed   By: Obie Dredge M.D.   On: 02/12/2017 16:06   Mr Brain Wo Contrast  Result Date: 02/12/2017 CLINICAL DATA:  Severe headache while at work last night. RIGHT-sided numbness. Ataxia, suspect stroke. History of sickle cell trait. EXAM: MRI HEAD WITHOUT CONTRAST TECHNIQUE: Axial and coronal diffusion weighted imaging, sagittal T1, axial T2 and axial T2 FLAIR obtained on a 1.5 tesla scanner. Patient was unable to tolerate further imaging. COMPARISON:  CT HEAD February 13, 2016 at 0709 hours. FINDINGS: Sequences  are mildly motion degraded. BRAIN: No reduced diffusion to suggest acute ischemia, hyperacute demyelination or hypercellular tumor. No field inhomogeneity to suggest blood products though susceptibility  weighted sequence not obtained. No intrinsic T1 shortening. He ventricles and sulci are normal for patient's age. No suspicious parenchymal signal, mass or mass effect. No abnormal extra-axial fluid collections. VASCULAR: Normal major vascular flow voids present at skull base. SKULL AND UPPER CERVICAL SPINE: Mild prevertebral soft tissue fullness. No abnormal sellar expansion. No suspicious calvarial bone marrow signal. Craniocervical junction maintained. SINUSES/ORBITS: Included paranasal sinuses are well aerated. Imaged mastoid air cells are well aerated. The included ocular globes and orbital contents are non-suspicious. OTHER: None. IMPRESSION: 1. Limited motion degraded MRI, patient was unable to tolerate complete examination. 2. Negative noncontrast MRI of the head. 3. Prevertebral fullness, seen with recent viral illness or immunocompromised states. Electronically Signed   By: Awilda Metroourtnay  Bloomer M.D.   On: 02/12/2017 13:49   Dg Chest Port 1 View  Result Date: 02/12/2017 CLINICAL DATA:  Catheter placement. EXAM: PORTABLE CHEST 1 VIEW COMPARISON:  Earlier today FINDINGS: Interval placement of right IJ catheter with tip in the projection of the distal SVC. No pneumothorax visualized. The heart size and mediastinal contours are within normal limits. Both lungs are clear. The visualized skeletal structures are unremarkable. IMPRESSION: No active disease. Electronically Signed   By: Signa Kellaylor  Stroud M.D.   On: 02/12/2017 20:37    I have reviewed the patient's current medications.  Assessment/Plan: 1 TMA AdamsT pending.   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  Neuro status stable. Tol TPE well.  Will cont daily TPE for 1 wk. P TPE, await labs.,  May consider PC once Ptlt a little better    LOS: 2 days   Fayrene FearingJames Morrigan Wickens 02/14/2017,8:41 AM

## 2017-02-14 NOTE — Progress Notes (Signed)
  Echocardiogram 2D Echocardiogram has been performed.  Delcie RochENNINGTON, Gracie Gupta 02/14/2017, 11:19 AM

## 2017-02-14 NOTE — Procedures (Signed)
TPE per R IJ cath.  tol well, no sx low Ca.  Using plasma for 1 vol exchange

## 2017-02-14 NOTE — Progress Notes (Signed)
PULMONARY / CRITICAL CARE MEDICINE   Name: Richard [name] MRN: [id number] DOB: 18-Mar-1990    ADMISSION DATE:  02/12/2017 CONSULTATION DATE:  02/12/2017  REFERRING MD:  Dr. Beryle Lathe and ED provider   CHIEF COMPLAINT:  Right sided hemiparesis   HISTORY OF PRESENT ILLNESS:   Richard [name] is a 55 yoM with sickle cell trait who presented to the [facility] ED for 9 hours of right sided paresthesia. As significant portion of the history was obtained from the chart and patients fiance as the patient is lethargic. He stated that around 2100 on 1/17 while at work he developed tingling and numbness of the entire right side of his body including his face with an associated headache. At that time he denied fever, chills, diplopia, vision changes, neck pain, dyspnea, abdominal pain, chest pain, confusion or generalized fatigue. He was recently placed on Tamiflu and amoxicillin for flulike symptoms the prior week but had been improving. The patient denies similar event previously.   In the ED CT and MRI of the head were negative for acute intracranial process. Initial CBC indicated a plt count of 16K and Hgb 5.8. LDH 1378, D-dimer 14.57, Retics 19.1%, with normal PT/INR/Fibrinogen 14.0/1.09/421 respectively. CMP was unremarkable except for slighytly elevated AST/ALT 61/64 with total bili of 2.7. At this point significant concern for TTP/HUS was noted with blood transfusion ordered. At this point he was determined to be in need of emergent plasmapheresis and was prepared for transfer to [facility] ICU. Upon arrival an HD catheter was placed in preparation of the procedure.    SUBJECTIVE:  Continues improved denies pain neuro deficits have resolved  VITAL SIGNS: BP 132/63   Pulse 92   Temp 98.2 F (36.8 C)   Resp 17   Ht 5\' 11"  (1.803 m)   Wt 123.8 kg (273 lb)   SpO2 95%   BMI 38.08 kg/m   HEMODYNAMICS:    VENTILATOR SETTINGS:    INTAKE / OUTPUT: I/O last 3 completed shifts: In: 2640.3  [P.O.:960; I.V.:396.3; Blood:630; IV Piggyback:654] Out: 2100 [Urine:2100]  PHYSICAL EXAMINATION: General: Alert orientated well-nourished well-developed male HEENT: No lymphadenopathy or JVD PSY: Good effect Neuro: Intact CV: Heart sounds are regular regular rate and rhythm PULM: Lungs clear to auscultation WU:JWJX, non-tender, bsx4 active  Extremities: warm/dry, mild edema  Skin: no rashes or lesions  LABS:  BMET Recent Labs  Lab 02/12/17 1438 02/13/17 0410 02/14/17 0427  NA 136 139 140  K 3.7 3.6 4.0  CL 105 103 104  CO2 24 24 26   BUN 13 12 17   CREATININE 1.09 1.07 1.05  GLUCOSE 158* 148* 148*    Electrolytes Recent Labs  Lab 02/12/17 1438 02/13/17 0410 02/14/17 0427  CALCIUM 8.6* 8.6* 8.9  MG  --  1.6* 2.0  PHOS  --   --  4.2    CBC Recent Labs  Lab 02/13/17 0800 02/13/17 1119 02/14/17 0427  WBC 26.5* 28.7* 34.9*  HGB 8.0* 7.8* 8.0*  HCT 24.0* 23.4* 23.6*  PLT 25* 20* 74*    Coag's Recent Labs  Lab 02/12/17 1416  INR 1.09    Sepsis Markers No results for input(s): LATICACIDVEN, PROCALCITON, O2SATVEN in the last 168 hours.  ABG No results for input(s): PHART, PCO2ART, PO2ART in the last 168 hours.  Liver Enzymes Recent Labs  Lab 02/12/17 1438  AST 61*  ALT 64*  ALKPHOS 78  BILITOT 2.7*  ALBUMIN 3.7    Cardiac Enzymes Recent Labs  Lab 02/12/17 2110  02/13/17 0410 02/13/17 1115  TROPONINI 0.38* 0.30* 0.22*    Glucose No results for input(s): GLUCAP in the last 168 hours.  Imaging No results found. STUDIES:  MRI brain  1/18 CT Head 1/18 CT angio 1/18  CULTURES: Blood Cx 1/18 >>>  ANTIBIOTICS: Non indicated at this time  SIGNIFICANT EVENTS: Transferred to ICU at [facility] 02/13/2016 first plasmapheresis initiated  LINES/TUBES: Right IJ HD cath 1/18>>> PIV 1/18>>>  DISCUSSION: 85 yoM with sickle cell trait who presented with right sided hemiparesis found to be with TTP with ADAMTS-13 pending although etiology  is unclear at this time. He was recently treated with amoxicillin and tamiflu for a flulike illness the previous week which are not commonly known to induce TTP. The patient was transferred to the ICU and prepared for plasmapheresis. He had received two units of blood.  02/13/2017 plasmapheresis is in progress.  02/14/2017 he continues to improve and is ready for transfer to stepdown unit or floor to continue his plasmapheresis.  ASSESSMENT / PLAN:  PULMONARY A: NO acute issues at this time. Dyspneic on admission  P:   Continue to monitor with SpO2 and vitals as indicated  CT Angiogram negative   CARDIOVASCULAR A:  Denied chest pain  EKG sinus tachycardia with no S1Q3T3 noted.  Initial isatat troponin 0.38 on admission  P:  First troponin is 0.38-second troponin is 0.3 most likely is demand ischemia Repeat EKG stat if chest pain or status change   RENAL Lab Results  Component Value Date   CREATININE 1.05 02/14/2017   CREATININE 1.07 02/13/2017   CREATININE 1.09 02/12/2017   Recent Labs  Lab 02/12/17 1438 02/13/17 0410 02/14/17 0427  K 3.7 3.6 4.0    A:   Renal function WNLs, Cr 1.09 Urine output maintaining, none documented in chart Given current diagnosis monitoring renal function is imperative  P:   Continue to monitor  BMP in am  Plasmapheresis ordered  GASTROINTESTINAL A:   NO acute GI concerns  NPO no tubes placed P:   Protonix for GI ppx Start diet 02/13/2017 HEMATOLOGIC Recent Labs    02/13/17 1119 02/14/17 0427  HGB 7.8* 8.0*    A:   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  ADAMTS-13 ordered and pending Plt count of 16K and Hgb 5.8. LDH 1,378 Coombs negative DIC negative with PT/INR/Fibrinogen 14.0/1.09/421 respectively Patient denied drugs of abuse including Cocaine  P:  Given two units of blood Typed and screen ordered Plasmapheresis with 1 episode completed and to have another 02/13/2017 Follow  CBC Hematology is following  INFECTIOUS A:   No acute indication of infection at this time Patient is afebrile, hypertensive on admission,  Leukocytosis 22.0 on admission, HR >90, tachypneic, this can be explained by the patients TTP P:   Will monitor for signs of infection No indication for antibiotics  Blood culture ordered   UA with multiple species  ENDOCRINE A:   No acute endocrine conditions noted   P:   No treatment indicated at this time  NEUROLOGIC A:   Patient is alert and answering questions and mentally intact P:   02/13/2017 pain is much improved Neuro deficits have resolved  FAMILY  - Updates: Patient and family updated at bedside 02/14/2017  - Inter-disciplinary family meet or Palliative Care meeting due by:  day 7  02/14/2017 he appears to be ready to transfer to floor transfer.  Brett Canales Pelham Hennick ACNP Adolph Pollack PCCM Pager [phone] till 1  pm If no answer page [phone] 02/14/2017, 11:20 AM

## 2017-02-15 LAB — CBC WITH DIFFERENTIAL/PLATELET
BASOS PCT: 0 %
Basophils Absolute: 0 10*3/uL (ref 0.0–0.1)
EOS ABS: 0 10*3/uL (ref 0.0–0.7)
EOS PCT: 0 %
HEMATOCRIT: 21.7 % — AB (ref 39.0–52.0)
Hemoglobin: 7.3 g/dL — ABNORMAL LOW (ref 13.0–17.0)
Lymphocytes Relative: 13 %
Lymphs Abs: 4.2 10*3/uL — ABNORMAL HIGH (ref 0.7–4.0)
MCH: 33.2 pg (ref 26.0–34.0)
MCHC: 33.6 g/dL (ref 30.0–36.0)
MCV: 98.6 fL (ref 78.0–100.0)
MONO ABS: 1.3 10*3/uL — AB (ref 0.1–1.0)
Monocytes Relative: 4 %
Neutro Abs: 27.1 10*3/uL — ABNORMAL HIGH (ref 1.7–7.7)
Neutrophils Relative %: 83 %
Platelets: 152 10*3/uL (ref 150–400)
RBC: 2.2 MIL/uL — AB (ref 4.22–5.81)
RDW: 26.2 % — AB (ref 11.5–15.5)
WBC: 32.6 10*3/uL — AB (ref 4.0–10.5)

## 2017-02-15 LAB — POCT I-STAT, CHEM 8
BUN: 17 mg/dL (ref 6–20)
CHLORIDE: 97 mmol/L — AB (ref 101–111)
Calcium, Ion: 1.15 mmol/L (ref 1.15–1.40)
Creatinine, Ser: 1 mg/dL (ref 0.61–1.24)
GLUCOSE: 178 mg/dL — AB (ref 65–99)
HEMATOCRIT: 23 % — AB (ref 39.0–52.0)
Hemoglobin: 7.8 g/dL — ABNORMAL LOW (ref 13.0–17.0)
Potassium: 3.4 mmol/L — ABNORMAL LOW (ref 3.5–5.1)
Sodium: 140 mmol/L (ref 135–145)
TCO2: 27 mmol/L (ref 22–32)

## 2017-02-15 LAB — RETICULOCYTES
RBC.: 2.2 MIL/uL — ABNORMAL LOW (ref 4.22–5.81)
RETIC COUNT ABSOLUTE: 426.8 10*3/uL — AB (ref 19.0–186.0)
RETIC CT PCT: 19.4 % — AB (ref 0.4–3.1)

## 2017-02-15 LAB — LACTATE DEHYDROGENASE: LDH: 267 U/L — ABNORMAL HIGH (ref 98–192)

## 2017-02-15 MED ORDER — CALCIUM CARBONATE ANTACID 500 MG PO CHEW
2.0000 | CHEWABLE_TABLET | ORAL | Status: DC
  Administered 2017-02-15: 400 mg via ORAL

## 2017-02-15 MED ORDER — ACD FORMULA A 0.73-2.45-2.2 GM/100ML VI SOLN
500.0000 mL | Status: DC
  Administered 2017-02-15: 500 mL via INTRAVENOUS

## 2017-02-15 MED ORDER — ACD FORMULA A 0.73-2.45-2.2 GM/100ML VI SOLN
Status: AC
  Administered 2017-02-15: 500 mL via INTRAVENOUS
  Filled 2017-02-15: qty 500

## 2017-02-15 MED ORDER — CALCIUM CARBONATE ANTACID 500 MG PO CHEW
CHEWABLE_TABLET | ORAL | Status: AC
  Administered 2017-02-15: 400 mg via ORAL
  Filled 2017-02-15: qty 4

## 2017-02-15 MED ORDER — ACD FORMULA A 0.73-2.45-2.2 GM/100ML VI SOLN
Status: AC
Start: 2017-02-15 — End: 2017-02-16
  Filled 2017-02-15: qty 500

## 2017-02-15 MED ORDER — ACETAMINOPHEN 325 MG PO TABS: 650.0000 mg | ORAL_TABLET | ORAL | Status: DC | PRN

## 2017-02-15 MED ORDER — ANTICOAGULANT SODIUM CITRATE 4% (200MG/5ML) IV SOLN
5.0000 mL | Freq: Once | Status: DC
  Filled 2017-02-15: qty 250

## 2017-02-15 MED ORDER — PREDNISONE 10 MG PO TABS
60.0000 mg | ORAL_TABLET | Freq: Every day | ORAL | Status: DC
  Administered 2017-02-16 – 2017-02-18 (×3): 60 mg via ORAL
  Filled 2017-02-15 (×3): qty 1

## 2017-02-15 MED ORDER — SODIUM CHLORIDE 0.9 % IV SOLN
2.0000 g | Freq: Once | INTRAVENOUS | Status: DC
  Administered 2017-02-15: 2 g via INTRAVENOUS
  Filled 2017-02-15: qty 20

## 2017-02-15 MED ORDER — DIPHENHYDRAMINE HCL 25 MG PO CAPS: 25.0000 mg | ORAL_CAPSULE | Freq: Four times a day (QID) | ORAL | Status: DC | PRN

## 2017-02-15 NOTE — Care Management Note (Signed)
Case Management Note  Patient Details  Name: Richard [name] MRN: [id number] Date of Birth: 04/23/1990  Subjective/Objective:   From home has a   history of sickle cell trait admitted with right-sided paresthesia after flu illness. CT head, MRI were negative. Found to have severe anemia, thrombocytopenia with schistocytes. Diagnosedwith TTP [facility] and [facility]mapheresis initiated.                 Action/Plan: NCM will follow for dc needs.   Expected Discharge Date:  (unknown)               Expected Discharge Plan:     In-House Referral:     Discharge planning Services  CM Consult  Post Acute Care Choice:    Choice offered to:     DME Arranged:    DME Agency:     HH Arranged:    HH Agency:     Status of Service:  In process, will continue to follow  If discussed at Long Length of Stay Meetings, dates discussed:    Additional Comments:  Richard [name], Richard Felmlee Clinton, RN 02/15/2017, 10:57 AM

## 2017-02-15 NOTE — Progress Notes (Signed)
[facility] TEAM 1 - Stepdown/ICU TEAM  Richard Richard [name]  WUJ:[id number] DOB: 01/10/1991 DOA: 02/12/2017 PCP: Patient, No Pcp Per    Brief Narrative:  27 year old with history of sickle cell trait admitted with right-sided paresthesia after flu-like illness.  CT head and MRI were nondiagnostic. Found to have severe anemia and thrombocytopenia with schistocytes. Diagnosedwith TTP.  [facility] and [facility].  Significant Events: [facility] HD cath placed  1/18 1st plasmapheresis   Subjective: The patient's chart was reviewed in detail.  All active issues were addressed by oncology and nephrology today.  [facility] but [facility].  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 rate 20, height 5\' 11"  (1.803 m), weight 126.7 kg (279 lb 5.2 oz), SpO2 98 %.  Intake/Output Summary (Last 24 hours) at 02/15/2017 1012 Last data filed at 02/15/2017 0800 Gross per 24 hour  Intake 840 ml  Output 950 ml  Net -110 ml   Filed Weights   02/14/17 0400 02/14/17 1250 02/15/17 0500  Weight: 123.8 kg (273 lb) 123.8 kg (272 lb 14.9 oz) 126.7 kg (279 lb 5.2 oz)    Examination: No exam today    CBC: Recent Labs  Lab 02/12/17 1416  02/13/17 0425 02/13/17 0800 02/13/17 1119 02/13/17 1335 02/14/17 0427 02/15/17 0434  WBC 22.0*   < > 31.6* 26.5* 28.7*  --  34.9* 32.6*  NEUTROABS 16.2*  --   --   --   --   --  28.7* 27.1*  HGB 5.8*   < > 7.9* 8.0* 7.8* 7.8* 8.0* 7.3*  HCT 17.2*   < > 23.3* 24.0* 23.4* 23.0* 23.6* 21.7*  MCV 95.6   < > 92.8 92.7 92.9  --  95.9 98.6  PLT 16*   < > 13* 25* 20*  --  74* 152   < > = values in this interval not displayed.   Basic Metabolic Panel: Recent Labs  Lab 02/12/17 1438 02/12/17 2121 02/13/17 0410 02/13/17 1335 02/14/17 0427  NA 136 139 139 140 140  K 3.7 4.2 3.6 3.6 4.0  CL 105 105 103 99* 104  CO2 24  --  24  --  26  GLUCOSE 158* 139* 148* 167* 148*  BUN 13 11 12 14 17   CREATININE 1.09 1.00 1.07 1.00 1.05  CALCIUM 8.6*  --  8.6*  --  8.9  MG  --   --  1.6*  --  2.0  PHOS  --   --   --   --  4.2   GFR: Estimated Creatinine Clearance: 143.3 mL/min (by C-G formula based on SCr of 1.05 mg/dL).  Liver Function Tests: Recent Labs  Lab 02/12/17 1438  AST 61*  ALT 64*  ALKPHOS 78  BILITOT 2.7*  PROT 7.0  ALBUMIN 3.7    Coagulation Profile: Recent Labs  Lab 02/12/17 1416  INR 1.09    Cardiac Enzymes: Recent Labs  Lab 02/12/17 2110 02/13/17 0410 02/13/17 1115  TROPONINI 0.38* 0.30* 0.22*    HbA1C: Hgb A1c MFr Bld  Date/Time Value Ref Range Status  02/13/2017 01:31 AM 4.6 (L) 4.8 - 5.6 % Final    Comment:    (NOTE) Pre diabetes:          5.7%-6.4% Diabetes:              >6.4% Glycemic control for   <7.0% adults with diabetes     CBG: No results for input(s): GLUCAP in the last 168 hours.  Recent Results (from the past 240 hour(s))  Respiratory Panel by PCR     Status: None   Collection Time: 02/12/17  3:40 PM  Result Value Ref Range Status   Adenovirus NOT DETECTED NOT DETECTED Final   Coronavirus 229E NOT DETECTED NOT DETECTED Final   Coronavirus HKU1 NOT DETECTED NOT DETECTED Final    Coronavirus NL63 NOT DETECTED NOT DETECTED Final   Coronavirus OC43 NOT DETECTED NOT DETECTED Final   Metapneumovirus NOT DETECTED NOT DETECTED Final   Rhinovirus / Enterovirus NOT DETECTED NOT DETECTED Final   Influenza A NOT DETECTED NOT DETECTED Final   Influenza B NOT DETECTED NOT DETECTED Final   Parainfluenza Virus 1 NOT DETECTED NOT DETECTED Final   Parainfluenza Virus 2 NOT DETECTED NOT DETECTED Final   Parainfluenza Virus 3 NOT DETECTED NOT DETECTED Final   Parainfluenza Virus 4 NOT DETECTED NOT DETECTED Final   Respiratory Syncytial Virus NOT DETECTED NOT DETECTED Final   Bordetella pertussis NOT DETECTED NOT DETECTED Final   Chlamydophila pneumoniae NOT DETECTED NOT DETECTED Final   Mycoplasma pneumoniae NOT DETECTED NOT DETECTED Final    Comment: Performed at [facility], [address], [place], [place] [address]  Surgical pcr screen     Status: None   Collection Time: 02/12/17  6:30 PM  Result Value Ref Range Status   MRSA, PCR NEGATIVE NEGATIVE Final   Staphylococcus aureus NEGATIVE NEGATIVE Final    Comment: (NOTE) The Xpert SA Assay (FDA approved for NASAL specimens in patients 75 years of age and older), is one component of a comprehensive surveillance program. It is not intended to diagnose infection nor to guide or monitor treatment. Performed at [facility], [address], [place], [place] [address]   Culture, Urine     Status: Abnormal   Collection Time: 02/13/17 12:09 AM  Result Value Ref Range Status   Specimen Description URINE, CLEAN CATCH  Final   Special Requests NONE  Final   Culture MULTIPLE SPECIES PRESENT, SUGGEST RECOLLECTION (A)  Final   Report Status 02/14/2017 FINAL  Final  Richard [name] culture (routine x 2)     Status: None (Preliminary result)   Collection Time: 02/13/17 12:22 AM  Result Value Ref Range Status   Specimen Description Richard [name] LEFT HAND  Final   Special Requests IN PEDIATRIC BOTTLE Richard [name] Culture adequate volume  Final    Culture NO GROWTH 1 DAY  Final   Report Status PENDING  Incomplete     Scheduled Meds: . B-complex with vitamin C  1 tablet Oral Daily  . calcium carbonate      . calcium carbonate  2 tablet Oral Q3H  .  folic acid  2 mg Oral Daily  . pantoprazole  40 mg Oral Daily     LOS: 3 days   Richard Blood, MD [facility] Office  [phone] Pager - Text Page per Amion as per below:  On-Call/Text Page:      [email or web address]      password [email or web address]  If 7PM-7AM, please contact night-coverage [email or web address] Password [email or web address] 02/15/2017, 10:12 AM

## 2017-02-15 NOTE — Progress Notes (Signed)
Subjective: Interval History: has no complaint, feeling better, no weakness.  Objective: Vital signs in last 24 hours: Temp:  [97.5 F (36.4 C)-98.7 F (37.1 C)] 98.4 F (36.9 C) (01/21 0800) Pulse Rate:  [78-108] 85 (01/21 0800) Resp:  [18-31] 20 (01/21 0933) BP: (100-151)/(43-87) 127/79 (01/21 0800) SpO2:  [98 %] 98 % (01/21 0800) Weight:  [123.8 kg (272 lb 14.9 oz)-126.7 kg (279 lb 5.2 oz)] 126.7 kg (279 lb 5.2 oz) (01/21 0500) Weight change: -0.032 kg (-1.1 oz)  Intake/Output from previous day: 01/20 0701 - 01/21 0700 In: 1000 [P.O.:720; I.V.:130; IV Piggyback:150] Out: 950 [Urine:950] Intake/Output this shift: Total I/O In: 80 [I.V.:80] Out: -   General appearance: alert, cooperative, no distress and mildly obese Neck: RIJ cath Resp: clear to auscultation bilaterally Cardio: S1, S2 normal and systolic murmur: holosystolic 2/6, blowing at apex GI: obese, pos bs Extremities: extremities normal, atraumatic, no cyanosis or edema  Lab Results: Recent Labs    02/14/17 0427 02/15/17 0434  WBC 34.9* 32.6*  HGB 8.0* 7.3*  HCT 23.6* 21.7*  PLT 74* 152   BMET:  Recent Labs    02/13/17 0410 02/13/17 1335 02/14/17 0427  NA 139 140 140  K 3.6 3.6 4.0  CL 103 99* 104  CO2 24  --  26  GLUCOSE 148* 167* 148*  BUN 12 14 17   CREATININE 1.07 1.00 1.05  CALCIUM 8.6*  --  8.9   No results for input(s): PTH in the last 72 hours. Iron Studies:  Recent Labs    02/12/17 1441  IRON 209*  TIBC 304  FERRITIN 1,230*    Studies/Results: No results found.  I have reviewed the patient's current medications.  Assessment/Plan: 1 TMA - AdamsT pending.   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  Neuro status stable. Tol TPE well.  Plts cont to improve. Will cont daily TPE for 1 wk. P TPE, await labs., may consider PC once plts better.    LOS: 3 days   Barbette Hairobert D Callaway Hardigree 02/15/2017,10:43 AM

## 2017-02-15 NOTE — Progress Notes (Signed)
HEMATOLOGY/ONCOLOGY INPATIENT PROGRESS NOTE  Date of Service: 02/15/2017  Inpatient Attending: .Lonia Blood, MD   SUBJECTIVE  Patient was seen in renal unit getting plasma exchange. He notes he is feeling much better and notes no acute new concerns. No CP/SOB/bleeding issues. No focal neurological deficits. Platelets have normalized to 152k.  OBJECTIVE:  NAD  PHYSICAL EXAMINATION: . Vitals:   02/15/17 1237 02/15/17 1241 02/15/17 1247 02/15/17 1300  BP: 131/66 134/65 127/63 127/63  Pulse:      Resp: (!) 25 (!) 28 (!) 22 18  Temp: 98.3 F (36.8 C) 98.3 F (36.8 C) 98.3 F (36.8 C) 98.3 F (36.8 C)  TempSrc: Oral Oral Oral Oral  SpO2:   98% 98%  Weight:      Height:       Filed Weights   02/14/17 0400 02/14/17 1250 02/15/17 0500  Weight: 273 lb (123.8 kg) 272 lb 14.9 oz (123.8 kg) 279 lb 5.2 oz (126.7 kg)   .Body mass index is 38.96 kg/m.  GENERAL:alert, in no acute distress and comfortable SKIN: few petechiae/small ecchymosis on extremities EYES: normal, conjunctiva are pink and non-injected, sclera clear OROPHARYNX:no exudate, no erythema and lips, buccal mucosa, and tongue normal  NECK: supple, no JVD, thyroid normal size, non-tender, without nodularity LYMPH:  no palpable lymphadenopathy in the cervical, axillary or inguinal LUNGS: clear to auscultation with normal respiratory effort HEART: regular rate & rhythm,  no murmurs and no lower extremity edema ABDOMEN: abdomen soft, non-tender, normoactive bowel sounds  Musculoskeletal: no cyanosis of digits and no clubbing  PSYCH: alert & oriented x 3 with fluent speech NEURO: no focal motor/sensory deficits  MEDICAL HISTORY:  Past Medical History:  Diagnosis Date  . Sickle cell trait (HCC)     SURGICAL HISTORY: History reviewed. No pertinent surgical history.  SOCIAL HISTORY: Social History   Socioeconomic History  . Marital status: Single    Spouse name: Not on file  . Number of children: Not  on file  . Years of education: Not on file  . Highest education level: Not on file  Social Needs  . Financial resource strain: Not on file  . Food insecurity - worry: Not on file  . Food insecurity - inability: Not on file  . Transportation needs - medical: Not on file  . Transportation needs - non-medical: Not on file  Occupational History  . Not on file  Tobacco Use  . Smoking status: Current Every Day Smoker  . Smokeless tobacco: Never Used  Substance and Sexual Activity  . Alcohol use: Yes    Comment: occ  . Drug use: No  . Sexual activity: Not on file  Other Topics Concern  . Not on file  Social History Narrative  . Not on file    FAMILY HISTORY: Family History  Problem Relation Age of Onset  . Cancer Other   . Diabetes Other   . Hypertension Other     ALLERGIES:  has No Known Allergies.  MEDICATIONS:  Scheduled Meds: . B-complex with vitamin C  1 tablet Oral Daily  . calcium carbonate  2 tablet Oral Q3H  . folic acid  2 mg Oral Daily  . pantoprazole  40 mg Oral Daily   Continuous Infusions: . sodium chloride 250 mL (02/13/17 0600)  . sodium chloride    . anticoagulant sodium citrate    . calcium gluconate IVPB    . citrate dextrose Stopped (02/15/17 0819)  . citrate dextrose    . methylPREDNISolone (  SOLU-MEDROL) injection Stopped (02/15/17 0510)   PRN Meds:.sodium chloride, sodium chloride, acetaminophen, acetaminophen, diphenhydrAMINE, diphenhydrAMINE  REVIEW OF SYSTEMS:    10 Point review of Systems was done is negative except as noted above.   LABORATORY DATA:  I have reviewed the data as listed  CBC Latest Ref Rng & Units 02/15/2017 02/15/2017 02/14/2017  WBC 4.0 - 10.5 K/uL - 32.6(H) 34.9(H)  Hemoglobin 13.0 - 17.0 g/dL 7.8(L) 7.3(L) 8.0(L)  Hematocrit 39.0 - 52.0 % 23.0(L) 21.7(L) 23.6(L)  Platelets 150 - 400 K/uL - 152 74(L)    CMP Latest Ref Rng & Units 02/15/2017 02/14/2017 02/13/2017  Glucose 65 - 99 mg/dL 409(W178(H) 119(J148(H) 478(G167(H)  BUN 6 -  20 mg/dL 17 17 14   Creatinine 0.61 - 1.24 mg/dL 9.561.00 2.131.05 0.861.00  Sodium 135 - 145 mmol/L 140 140 140  Potassium 3.5 - 5.1 mmol/L 3.4(L) 4.0 3.6  Chloride 101 - 111 mmol/L 97(L) 104 99(L)  CO2 22 - 32 mmol/L - 26 -  Calcium 8.9 - 10.3 mg/dL - 8.9 -  Total Protein 6.5 - 8.1 g/dL - - -  Total Bilirubin 0.3 - 1.2 mg/dL - - -  Alkaline Phos 38 - 126 U/L - - -  AST 15 - 41 U/L - - -  ALT 17 - 63 U/L - - -   ADAM TS 13 --levels pending   RADIOGRAPHIC STUDIES: I have personally reviewed the radiological images as listed and agreed with the findings in the report. Dg Chest 2 View  Result Date: 02/12/2017 CLINICAL DATA:  Shortness of Breath EXAM: CHEST  2 VIEW COMPARISON:  None. FINDINGS: Lungs are clear. Heart size and pulmonary vascularity are normal. No adenopathy. No bone lesions. IMPRESSION: No edema or consolidation. Electronically Signed   By: Bretta BangWilliam  Woodruff III M.D.   On: 02/12/2017 14:23   Ct Head Wo Contrast  Result Date: 02/12/2017 CLINICAL DATA:  Right-sided numbness. History of sickle cell disease EXAM: CT HEAD WITHOUT CONTRAST TECHNIQUE: Contiguous axial images were obtained from the base of the skull through the vertex without intravenous contrast. COMPARISON:  None. FINDINGS: Brain: The ventricles are normal in size and configuration. There is no intracranial mass, hemorrhage, extra-axial fluid collection, or midline shift. Gray-white compartments are normal. No acute infarct evident. Vascular: No hyperdense vessels.  No vascular calcification evident. Skull: The bony calvarium appears intact. Sinuses/Orbits: Visualized paranasal sinuses are clear. Orbits appear symmetric bilaterally. Other: Mastoid air cells are clear. IMPRESSION: Study within normal limits. Electronically Signed   By: Bretta BangWilliam  Woodruff III M.D.   On: 02/12/2017 07:34   Ct Angio Chest Pe W And/or Wo Contrast  Result Date: 02/12/2017 CLINICAL DATA:  Shortness of breath with exertion. Elevated D-dimer. Evaluate for  pulmonary embolism. EXAM: CT ANGIOGRAPHY CHEST WITH CONTRAST TECHNIQUE: Multidetector CT imaging of the chest was performed using the standard protocol during bolus administration of intravenous contrast. Multiplanar CT image reconstructions and MIPs were obtained to evaluate the vascular anatomy. CONTRAST:  100mL ISOVUE-370 IOPAMIDOL (ISOVUE-370) INJECTION 76% COMPARISON:  Chest x-ray from same day. FINDINGS: Cardiovascular: Suboptimal opacification of the pulmonary arteries due to bolus timing and patient's body habitus. No central or lobar pulmonary embolism. Normal heart size. No pericardial effusion. Normal caliber thoracic aorta. Mediastinum/Nodes: No enlarged mediastinal, hilar, or axillary lymph nodes. Thyroid gland, trachea, and esophagus demonstrate no significant findings. Lungs/Pleura: Lungs are clear. No pleural effusion or pneumothorax. Upper Abdomen: No acute abnormality. Musculoskeletal: Bilateral gynecomastia. No acute or significant osseous findings. Review of the MIP images  confirms the above findings. IMPRESSION: 1. No central or lobar pulmonary embolism. The segmental pulmonary arteries are not well evaluated due to bolus timing and patient's body habitus. 2. No acute intrathoracic process. Electronically Signed   By: Obie Dredge M.D.   On: 02/12/2017 16:06   Mr Brain Wo Contrast  Result Date: 02/12/2017 CLINICAL DATA:  Severe headache while at work last night. RIGHT-sided numbness. Ataxia, suspect stroke. History of sickle cell trait. EXAM: MRI HEAD WITHOUT CONTRAST TECHNIQUE: Axial and coronal diffusion weighted imaging, sagittal T1, axial T2 and axial T2 FLAIR obtained on a 1.5 tesla scanner. Patient was unable to tolerate further imaging. COMPARISON:  CT HEAD February 13, 2016 at 0709 hours. FINDINGS: Sequences are mildly motion degraded. BRAIN: No reduced diffusion to suggest acute ischemia, hyperacute demyelination or hypercellular tumor. No field inhomogeneity to suggest blood  products though susceptibility weighted sequence not obtained. No intrinsic T1 shortening. He ventricles and sulci are normal for patient's age. No suspicious parenchymal signal, mass or mass effect. No abnormal extra-axial fluid collections. VASCULAR: Normal major vascular flow voids present at skull base. SKULL AND UPPER CERVICAL SPINE: Mild prevertebral soft tissue fullness. No abnormal sellar expansion. No suspicious calvarial bone marrow signal. Craniocervical junction maintained. SINUSES/ORBITS: Included paranasal sinuses are well aerated. Imaged mastoid air cells are well aerated. The included ocular globes and orbital contents are non-suspicious. OTHER: None. IMPRESSION: 1. Limited motion degraded MRI, patient was unable to tolerate complete examination. 2. Negative noncontrast MRI of the head. 3. Prevertebral fullness, seen with recent viral illness or immunocompromised states. Electronically Signed   By: Awilda Metro M.D.   On: 02/12/2017 13:49   Dg Chest Port 1 View  Result Date: 02/12/2017 CLINICAL DATA:  Catheter placement. EXAM: PORTABLE CHEST 1 VIEW COMPARISON:  Earlier today FINDINGS: Interval placement of right IJ catheter with tip in the projection of the distal SVC. No pneumothorax visualized. The heart size and mediastinal contours are within normal limits. Both lungs are clear. The visualized skeletal structures are unremarkable. IMPRESSION: No active disease. Electronically Signed   By: Signa Kell M.D.   On: 02/12/2017 20:37    ASSESSMENT & PLAN:   27 y.o. 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 On presentation hemoglobin is down to 5.8 with elevated LDH of 1300s. LDH improved to 200's. hgb up to 7-8. Robust reticulocytosis. -HIV test -non reactive  2. 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  Platelets were 16k on initial  presentation. Improved to 152k this AM With no overt bleeding noted at this time.  3. Left sided mild weakness/numbness - resolved No overt CVA at this time an MRI.  Plan -pending ADAMTS 13 levels -daily plasma exchange till LDH and platelets normalize - plasma Ex today and tomorrow then switch to every other day and will hope for discharge later in the week if stable. -Will treat the patient with IV solu-medrol 250 mg q 6 x 12 doses (completed today) with GI prophylaxis then switch to Prednisone 60mg  po daily (from tomorrow) and will taper over 2-3weeks - PRBC transfusion prn to maintain Hgb >7 or if symptomatic - folic acid 2 mg, B12 1000 mcg daily -daily cbc with diff and reticulocyte counts, LDH, cmp   4. URI symptoms  -Viral respiratory panel - neg. Symptoms resolved  5. Elevated ferritin on admission - due to overt hemolysis.  Hematology will continue to follow daily  I spent 20 minutes counseling the patient face to face. The total time spent in the appointment was 25 minutes and more than 50% was on counseling and direct patient cares.    Wyvonnia Lora MD MS AAHIVMS [facility] Hematology/Oncology Physician [facility]  (Office):       [phone] (Work cell):  [phone] (Fax):           [phone]  02/15/2017 8:23 AM

## 2017-02-15 NOTE — Progress Notes (Signed)
Admitted to 6N2, alert and oriented, not in any distress. Oriented to unit and staff.

## 2017-02-15 NOTE — Progress Notes (Signed)
0800: patient assessed 1000: patient transported to another unit for plasmapheresis 1300: patient transported back to 2H; no changes to assessment unless otherwise charted 1600: patient reassessed; no changes unless otherwise charted 1645: patient transferred to [facility]N

## 2017-02-16 LAB — THERAPEUTIC PLASMA EXCHANGE (BLOOD BANK)
Plasma Exchange: 4360
Plasma Exchange: 4361
Plasma volume needed: 4360
Plasma volume needed: 4360
UNIT DIVISION: 0
UNIT DIVISION: 0
UNIT DIVISION: 0
UNIT DIVISION: 0
UNIT DIVISION: 0
UNIT DIVISION: 0
UNIT DIVISION: 0
UNIT DIVISION: 0
UNIT DIVISION: 0
UNIT DIVISION: 0
UNIT DIVISION: 0
UNIT DIVISION: 0
UNIT DIVISION: 0
UNIT DIVISION: 0
UNIT DIVISION: 0
Unit division: 0
Unit division: 0
Unit division: 0
Unit division: 0
Unit division: 0
Unit division: 0
Unit division: 0
Unit division: 0
Unit division: 0
Unit division: 0
Unit division: 0
Unit division: 0
Unit division: 0
Unit division: 0
Unit division: 0

## 2017-02-16 LAB — CBC WITH DIFFERENTIAL/PLATELET
BASOS PCT: 1 %
Basophils Absolute: 0.3 10*3/uL — ABNORMAL HIGH (ref 0.0–0.1)
EOS ABS: 0 10*3/uL (ref 0.0–0.7)
Eosinophils Relative: 0 %
HCT: 26.4 % — ABNORMAL LOW (ref 39.0–52.0)
Hemoglobin: 8.6 g/dL — ABNORMAL LOW (ref 13.0–17.0)
Lymphocytes Relative: 14 %
Lymphs Abs: 3.8 10*3/uL (ref 0.7–4.0)
MCH: 32.6 pg (ref 26.0–34.0)
MCHC: 32.6 g/dL (ref 30.0–36.0)
MCV: 100 fL (ref 78.0–100.0)
Monocytes Absolute: 2.1 10*3/uL — ABNORMAL HIGH (ref 0.1–1.0)
Monocytes Relative: 8 %
NEUTROS ABS: 20.6 10*3/uL — AB (ref 1.7–7.7)
NEUTROS PCT: 77 %
PLATELETS: 299 10*3/uL (ref 150–400)
RBC: 2.64 MIL/uL — ABNORMAL LOW (ref 4.22–5.81)
RDW: 25.3 % — AB (ref 11.5–15.5)
WBC: 26.8 10*3/uL — ABNORMAL HIGH (ref 4.0–10.5)

## 2017-02-16 LAB — TYPE AND SCREEN
ABO/RH(D): O POS
ANTIBODY SCREEN: NEGATIVE
UNIT DIVISION: 0
UNIT DIVISION: 0

## 2017-02-16 LAB — POCT I-STAT, CHEM 8
BUN: 13 mg/dL (ref 6–20)
CREATININE: 1 mg/dL (ref 0.61–1.24)
Calcium, Ion: 1.14 mmol/L — ABNORMAL LOW (ref 1.15–1.40)
Chloride: 98 mmol/L — ABNORMAL LOW (ref 101–111)
GLUCOSE: 153 mg/dL — AB (ref 65–99)
HCT: 30 % — ABNORMAL LOW (ref 39.0–52.0)
HEMOGLOBIN: 10.2 g/dL — AB (ref 13.0–17.0)
POTASSIUM: 3.5 mmol/L (ref 3.5–5.1)
Sodium: 140 mmol/L (ref 135–145)
TCO2: 27 mmol/L (ref 22–32)

## 2017-02-16 LAB — BPAM RBC
BLOOD PRODUCT EXPIRATION DATE: 201902162359
Blood Product Expiration Date: 201902162359
ISSUE DATE / TIME: 201901181703
Unit Type and Rh: 5100
Unit Type and Rh: 5100

## 2017-02-16 LAB — BPAM FFP
BLOOD PRODUCT EXPIRATION DATE: 201901212359
UNIT TYPE AND RH: 5100

## 2017-02-16 LAB — PREPARE FRESH FROZEN PLASMA: Unit division: 0

## 2017-02-16 LAB — RETICULOCYTES
RBC.: 2.64 MIL/uL — AB (ref 4.22–5.81)
Retic Count, Absolute: 522.7 10*3/uL — ABNORMAL HIGH (ref 19.0–186.0)
Retic Ct Pct: 19.8 % — ABNORMAL HIGH (ref 0.4–3.1)

## 2017-02-16 LAB — LACTATE DEHYDROGENASE: LDH: 318 U/L — ABNORMAL HIGH (ref 98–192)

## 2017-02-16 MED ORDER — SODIUM CHLORIDE 0.9 % IV SOLN
2.0000 g | Freq: Once | INTRAVENOUS | Status: AC
  Administered 2017-02-16: 2 g via INTRAVENOUS
  Filled 2017-02-16: qty 20

## 2017-02-16 MED ORDER — CALCIUM CARBONATE ANTACID 500 MG PO CHEW
CHEWABLE_TABLET | ORAL | Status: AC
  Filled 2017-02-16: qty 2

## 2017-02-16 MED ORDER — ANTICOAGULANT SODIUM CITRATE 4% (200MG/5ML) IV SOLN
5.0000 mL | Freq: Once | Status: DC
  Administered 2017-02-16: 5 mL
  Filled 2017-02-16: qty 5

## 2017-02-16 MED ORDER — ACD FORMULA A 0.73-2.45-2.2 GM/100ML VI SOLN
500.0000 mL | Status: DC
  Administered 2017-02-16: 500 mL via INTRAVENOUS
  Filled 2017-02-16: qty 500

## 2017-02-16 MED ORDER — ACETAMINOPHEN 325 MG PO TABS: 650.0000 mg | ORAL_TABLET | ORAL | Status: DC | PRN

## 2017-02-16 MED ORDER — ACD FORMULA A 0.73-2.45-2.2 GM/100ML VI SOLN
Status: AC
  Filled 2017-02-16: qty 500

## 2017-02-16 MED ORDER — DIPHENHYDRAMINE HCL 25 MG PO CAPS: 25.0000 mg | ORAL_CAPSULE | Freq: Four times a day (QID) | ORAL | Status: DC | PRN

## 2017-02-16 MED ORDER — CALCIUM CARBONATE ANTACID 500 MG PO CHEW
2.0000 | CHEWABLE_TABLET | ORAL | Status: AC
  Administered 2017-02-16: 400 mg via ORAL

## 2017-02-16 NOTE — Progress Notes (Signed)
Subjective: Interval History: has no complaint, feeling better, no weakness.  Labs not back today.   Objective: Vital signs in last 24 hours: Temp:  [97.8 F (36.6 C)-98.4 F (36.9 C)] 97.8 F (36.6 C) (01/22 0501) Pulse Rate:  [79-85] 79 (01/22 0501) Resp:  [18-30] 18 (01/22 0501) BP: (118-141)/(52-69) 141/53 (01/22 0501) SpO2:  [98 %-100 %] 100 % (01/22 0501) Weight:  [125.6 kg (277 lb)] 125.6 kg (277 lb) (01/22 0501) Weight change: 1.846 kg (4 lb 1.1 oz)  Intake/Output from previous day: 01/21 0701 - 01/22 0700 In: 80 [I.V.:80] Out: -  Intake/Output this shift: No intake/output data recorded.  General appearance: alert, cooperative, no distress and mildly obese Neck: RIJ cath Resp: clear to auscultation bilaterally Cardio: S1, S2 normal and systolic murmur: holosystolic 2/6, blowing at apex GI: obese, pos bs Extremities: extremities normal, atraumatic, no cyanosis or edema  Lab Results: Recent Labs    02/14/17 0427 02/15/17 0434 02/15/17 1029  WBC 34.9* 32.6*  --   HGB 8.0* 7.3* 7.8*  HCT 23.6* 21.7* 23.0*  PLT 74* 152  --    BMET:  Recent Labs    02/14/17 0427 02/15/17 1029  NA 140 140  K 4.0 3.4*  CL 104 97*  CO2 26  --   GLUCOSE 148* 178*  BUN 17 17  CREATININE 1.05 1.00  CALCIUM 8.9  --    No results for input(s): PTH in the last 72 hours. Iron Studies:  No results for input(s): IRON, TIBC, TRANSFERRIN, FERRITIN in the last 72 hours.  Studies/Results: No results found.  I have reviewed the patient's current medications.  Assessment/Plan: 1 TMA - AdamsT pending.   No labs today.  No new complaints.  Cont TPE per Hem- Onc, TPE today.  Has temp cath.  Will follow.    LOS: 4 days   Barbette Hairobert D Fay Bagg 02/16/2017,11:48 AM

## 2017-02-16 NOTE — Progress Notes (Signed)
HEMATOLOGY/ONCOLOGY INPATIENT PROGRESS NOTE  Date of Service: 02/16/2017  Inpatient Attending: .Lonia Blood, MD   SUBJECTIVE  Patient notes no acute new symptoms. Tolerating plasma exchange without any issues. Platelet up to 299k. Hgb improved to 8.6. No FND. No other acute new symptoms.   OBJECTIVE:  NAD  PHYSICAL EXAMINATION: . Vitals:   02/16/17 1612 02/16/17 1618 02/16/17 1800 02/16/17 2108  BP: (!) 173/88 (!) 171/93 (!) 150/95 (!) 146/72  Pulse: 93 92 80 90  Resp: (!) 25 (!) 21 18 18   Temp: 98.2 F (36.8 C) 98.3 F (36.8 C) 98.3 F (36.8 C) 98.4 F (36.9 C)  TempSrc: Oral Oral Oral Oral  SpO2:  98% 97% 99%  Weight:      Height:       Filed Weights   02/14/17 1250 02/15/17 0500 02/16/17 0501  Weight: 272 lb 14.9 oz (123.8 kg) 279 lb 5.2 oz (126.7 kg) 277 lb (125.6 kg)   .Body mass index is 38.63 kg/m.  GENERAL:alert, in no acute distress and comfortable SKIN: few petechiae/small ecchymosis on extremities EYES: normal, conjunctiva are pink and non-injected, sclera clear OROPHARYNX:no exudate, no erythema and lips, buccal mucosa, and tongue normal  NECK: supple, no JVD, thyroid normal size, non-tender, without nodularity LYMPH:  no palpable lymphadenopathy in the cervical, axillary or inguinal LUNGS: clear to auscultation with normal respiratory effort HEART: regular rate & rhythm,  no murmurs and no lower extremity edema ABDOMEN: abdomen soft, non-tender, normoactive bowel sounds  Musculoskeletal: no cyanosis of digits and no clubbing  PSYCH: alert & oriented x 3 with fluent speech NEURO: no focal motor/sensory deficits  MEDICAL HISTORY:  Past Medical History:  Diagnosis Date  . Sickle cell trait (HCC)     SURGICAL HISTORY: History reviewed. No pertinent surgical history.  SOCIAL HISTORY: Social History   Socioeconomic History  . Marital status: Single    Spouse name: Not on file  . Number of children: Not on file  . Years of  education: Not on file  . Highest education level: Not on file  Social Needs  . Financial resource strain: Not on file  . Food insecurity - worry: Not on file  . Food insecurity - inability: Not on file  . Transportation needs - medical: Not on file  . Transportation needs - non-medical: Not on file  Occupational History  . Not on file  Tobacco Use  . Smoking status: Current Every Day Smoker  . Smokeless tobacco: Never Used  Substance and Sexual Activity  . Alcohol use: Yes    Comment: occ  . Drug use: No  . Sexual activity: Not on file  Other Topics Concern  . Not on file  Social History Narrative  . Not on file    FAMILY HISTORY: Family History  Problem Relation Age of Onset  . Cancer Other   . Diabetes Other   . Hypertension Other     ALLERGIES:  has No Known Allergies.  MEDICATIONS:  Scheduled Meds: . B-complex with vitamin C  1 tablet Oral Daily  . folic acid  2 mg Oral Daily  . pantoprazole  40 mg Oral Daily  . predniSONE  60 mg Oral Q breakfast   Continuous Infusions: . sodium chloride     PRN Meds:.sodium chloride  REVIEW OF SYSTEMS:    10 Point review of Systems was done is negative except as noted above.   LABORATORY DATA:  I have reviewed the data as listed  CBC Latest Ref  Rng & Units 02/16/2017 02/16/2017 02/16/2017  WBC 4.0 - 10.5 K/uL 26.8(H) - -  Hemoglobin 13.0 - 17.0 g/dL 4.0(J) 10.2(L) 6.5(LL)  Hematocrit 39.0 - 52.0 % 26.4(L) 30.0(L) 19.0(L)  Platelets 150 - 400 K/uL 299 - -    CMP Latest Ref Rng & Units 02/16/2017 02/16/2017 02/15/2017  Glucose 65 - 99 mg/dL 811(B) 147(W) 295(A)  BUN 6 - 20 mg/dL 13 10 17   Creatinine 0.61 - 1.24 mg/dL 2.13 0.86 5.78  Sodium 135 - 145 mmol/L 140 153(H) 140  Potassium 3.5 - 5.1 mmol/L 3.5 2.5(LL) 3.4(L)  Chloride 101 - 111 mmol/L 98(L) 98(L) 97(L)  CO2 22 - 32 mmol/L - - -  Calcium 8.9 - 10.3 mg/dL - - -  Total Protein 6.5 - 8.1 g/dL - - -  Total Bilirubin 0.3 - 1.2 mg/dL - - -  Alkaline Phos 38 -  126 U/L - - -  AST 15 - 41 U/L - - -  ALT 17 - 63 U/L - - -   ADAM TS 13 --levels pending   RADIOGRAPHIC STUDIES: I have personally reviewed the radiological images as listed and agreed with the findings in the report. Dg Chest 2 View  Result Date: 02/12/2017 CLINICAL DATA:  Shortness of Breath EXAM: CHEST  2 VIEW COMPARISON:  None. FINDINGS: Lungs are clear. Heart size and pulmonary vascularity are normal. No adenopathy. No bone lesions. IMPRESSION: No edema or consolidation. Electronically Signed   By: Bretta Bang III M.D.   On: 02/12/2017 14:23   Ct Head Wo Contrast  Result Date: 02/12/2017 CLINICAL DATA:  Right-sided numbness. History of sickle cell disease EXAM: CT HEAD WITHOUT CONTRAST TECHNIQUE: Contiguous axial images were obtained from the base of the skull through the vertex without intravenous contrast. COMPARISON:  None. FINDINGS: Brain: The ventricles are normal in size and configuration. There is no intracranial mass, hemorrhage, extra-axial fluid collection, or midline shift. Gray-white compartments are normal. No acute infarct evident. Vascular: No hyperdense vessels.  No vascular calcification evident. Skull: The bony calvarium appears intact. Sinuses/Orbits: Visualized paranasal sinuses are clear. Orbits appear symmetric bilaterally. Other: Mastoid air cells are clear. IMPRESSION: Study within normal limits. Electronically Signed   By: Bretta Bang III M.D.   On: 02/12/2017 07:34   Ct Angio Chest Pe W And/or Wo Contrast  Result Date: 02/12/2017 CLINICAL DATA:  Shortness of breath with exertion. Elevated D-dimer. Evaluate for pulmonary embolism. EXAM: CT ANGIOGRAPHY CHEST WITH CONTRAST TECHNIQUE: Multidetector CT imaging of the chest was performed using the standard protocol during bolus administration of intravenous contrast. Multiplanar CT image reconstructions and MIPs were obtained to evaluate the vascular anatomy. CONTRAST:  ISOVUE-370 IOPAMIDOL (ISOVUE-370)  INJECTION 76% COMPARISON:  Chest x-ray from same day. FINDINGS: Cardiovascular: Suboptimal opacification of the pulmonary arteries due to bolus timing and patient's body habitus. No central or lobar pulmonary embolism. Normal heart size. No pericardial effusion. Normal caliber thoracic aorta. Mediastinum/Nodes: No enlarged mediastinal, hilar, or axillary lymph nodes. Thyroid gland, trachea, and esophagus demonstrate no significant findings. Lungs/Pleura: Lungs are clear. No pleural effusion or pneumothorax. Upper Abdomen: No acute abnormality. Musculoskeletal: Bilateral gynecomastia. No acute or significant osseous findings. Review of the MIP images confirms the above findings. IMPRESSION: 1. No central or lobar pulmonary embolism. The segmental pulmonary arteries are not well evaluated due to bolus timing and patient's body habitus. 2. No acute intrathoracic process. Electronically Signed   By: Obie Dredge M.D.   On: 02/12/2017 16:06   Mr Brain Ilda Basset  Contrast  Result Date: 02/12/2017 CLINICAL DATA:  Severe headache while at work last night. RIGHT-sided numbness. Ataxia, suspect stroke. History of sickle cell trait. EXAM: MRI HEAD WITHOUT CONTRAST TECHNIQUE: Axial and coronal diffusion weighted imaging, sagittal T1, axial T2 and axial T2 FLAIR obtained on a 1.5 tesla scanner. Patient was unable to tolerate further imaging. COMPARISON:  CT HEAD February 13, 2016 at 0709 hours. FINDINGS: Sequences are mildly motion degraded. BRAIN: No reduced diffusion to suggest acute ischemia, hyperacute demyelination or hypercellular tumor. No field inhomogeneity to suggest blood products though susceptibility weighted sequence not obtained. No intrinsic T1 shortening. He ventricles and sulci are normal for patient's age. No suspicious parenchymal signal, mass or mass effect. No abnormal extra-axial fluid collections. VASCULAR: Normal major vascular flow voids present at skull base. SKULL AND UPPER CERVICAL SPINE: Mild  prevertebral soft tissue fullness. No abnormal sellar expansion. No suspicious calvarial bone marrow signal. Craniocervical junction maintained. SINUSES/ORBITS: Included paranasal sinuses are well aerated. Imaged mastoid air cells are well aerated. The included ocular globes and orbital contents are non-suspicious. OTHER: None. IMPRESSION: 1. Limited motion degraded MRI, patient was unable to tolerate complete examination. 2. Negative noncontrast MRI of the head. 3. Prevertebral fullness, seen with recent viral illness or immunocompromised states. Electronically Signed   By: Awilda Metroourtnay  Bloomer M.D.   On: 02/12/2017 13:49   Dg Chest Port 1 View  Result Date: 02/12/2017 CLINICAL DATA:  Catheter placement. EXAM: PORTABLE CHEST 1 VIEW COMPARISON:  Earlier today FINDINGS: Interval placement of right IJ catheter with tip in the projection of the distal SVC. No pneumothorax visualized. The heart size and mediastinal contours are within normal limits. Both lungs are clear. The visualized skeletal structures are unremarkable. IMPRESSION: No active disease. Electronically Signed   By: Signa Kellaylor  Stroud M.D.   On: 02/12/2017 20:37    ASSESSMENT & PLAN:   27 y.o. 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 On presentation hemoglobin is down to 5.8 with elevated LDH of 1300s. LDH improved to 200's. hgb up to 7-8. Robust reticulocytosis. -HIV test -non reactive  2. 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  Platelets were 16k on initial presentation. Improved to 299k this AM With no overt bleeding noted at this time.  3. Left sided mild weakness/numbness - resolved No overt CVA at this time an MRI.  Plan -pending ADAMTS 13 levels - plasma exchange tomorrow and Thursday. -if labs stable could potentially discharge patient on Thursday after plasma exchange and reviewing labs -would  remove catheter then prior to discharge -will schedule for f/u with labs on Tuesday 12/24/2017 in hematology clinic with me. -if concern/need for ongoing outpatient plasma-ex will then decide to have a tunneled catheter placed. -continue Prednisone with taper over 2 weeks. -if relapse will need to consider Rituxan. - PRBC transfusion prn to maintain Hgb >7 or if symptomatic - folic acid 2 mg, B12 1000 mcg daily -daily cbc with diff and reticulocyte counts, LDH, cmp   4. URI symptoms  -Viral respiratory panel - neg. Symptoms resolved  5. Elevated ferritin on admission - due to overt hemolysis.  Hematology will continue to follow daily   I spent 20 minutes counseling the patient face to face. The total time spent in the appointment was 25 minutes and more than 50% was on counseling and direct patient cares.    Wyvonnia LoraGautam Michaelyn Wall MD MS AAHIVMS [facility] Hematology/Oncology Physician  [facility]  (Office):       [phone] (Work cell):  [phone] (Fax):           [phone]  02/16/2017 10:09 AM

## 2017-02-16 NOTE — Progress Notes (Signed)
[facility] TEAM 1 - Stepdown/ICU TEAM  Fredirick Maudlin  ZOX:[id number] DOB: 1990-10-28 DOA: 02/12/2017 PCP: Patient, No Pcp Per    Brief Narrative:  27 year old with history of sickle cell trait admitted with right-sided paresthesia after flu-like illness.  CT head and MRI were nondiagnostic. Found to have severe anemia and thrombocytopenia with schistocytes. Diagnosedwith TTP.  Transferred to [facility] and plasmapheresis initiated.  Significant Events: 1/18 admit at [facility] - transferred to [facility] - R IJ HD cath placed  1/18 1st plasmapheresis   Subjective: The patient is seen in the hemodialysis unit.  He is resting comfortably.  He denies chest pain shortness of breath fevers or chills.  He has multiple questions about his diagnosis and our treatment plan and I have taken the time to answer these questions.  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 - s/p 2U PRBC - goal is Hgb > 7.5  Recent Labs  Lab 02/14/17 0427 02/15/17 0434 02/15/17 1029 02/16/17 1401 02/16/17 1417  HGB 8.0* 7.3* 7.8* 6.5* 8.6*  10.2*    Sinus tachycardia - mild elevation in troponin  Likely demand in setting of anemia and tachycardia - HR more stable today   Routine screening  Pt concerned about sexually transmitted diseases - HIV is negative - will check acute viral hepatitis panel   DVT prophylaxis: SCDs Code Status: FULL CODE Family Communication: no family present at time of exam  Disposition Plan:   Consultants:  PCCM Nephrology  Heme/Onc   Antimicrobials:  none  Objective: Blood pressure (!)  171/93, pulse 92, temperature 98.3 F (36.8 C), temperature source Oral, resp. rate (!) 21, height 5\' 11"  (1.803 m), weight 125.6 kg (277 lb), SpO2 98 %. No intake or output data in the 24 hours ending 02/16/17 1758 Filed Weights   02/14/17 1250 02/15/17 0500 02/16/17 0501  Weight: 123.8 kg (272 lb 14.9 oz) 126.7 kg (279 lb 5.2 oz) 125.6 kg (277 lb)    Examination: General: No acute respiratory distress Lungs: Clear to auscultation bilaterally without wheezes or crackles Cardiovascular: Regular rate and rhythm without murmur gallop or rub normal S1 and S2 Abdomen: Nontender, nondistended, soft, bowel sounds positive, no rebound, no ascites, no appreciable mass Extremities: No significant cyanosis, clubbing, or edema bilateral lower extremities  CBC: Recent Labs  Lab 02/12/17 1416  02/13/17 0800 02/13/17 1119  02/14/17 0427 02/15/17 0434 02/15/17 1029 02/16/17 1401 02/16/17 1417  WBC 22.0*   < > 26.5* 28.7*  --  34.9* 32.6*  --   --  26.8*  NEUTROABS 16.2*  --   --   --   --  28.7* 27.1*  --   --  20.6*  HGB 5.8*   < > 8.0* 7.8*   < > 8.0* 7.3* 7.8* 6.5* 8.6*  10.2*  HCT 17.2*   < > 24.0* 23.4*   < > 23.6* 21.7* 23.0* 19.0* 26.4*  30.0*  MCV 95.6   < > 92.7 92.9  --  95.9 98.6  --   --  100.0  PLT 16*   < > 25* 20*  --  74* 152  --   --  299   < > = values in this interval not displayed.   Basic Metabolic Panel: Recent Labs  Lab 02/12/17 1438  02/13/17 0410 02/13/17 1335 02/14/17 0427 02/15/17 1029 02/16/17 1401 02/16/17 1417  NA 136   < > 139 140 140 140 153* 140  K 3.7   < > 3.6 3.6 4.0 3.4* 2.5* 3.5  CL 105   < > 103 99* 104 97* 98* 98*  CO2 24  --  24  --  26  --   --   --   GLUCOSE 158*   < > 148* 167* 148* 178* 133* 153*  BUN 13   < > 12 14 17 17 10 13   CREATININE 1.09   < > 1.07 1.00 1.05 1.00 0.80 1.00  CALCIUM 8.6*  --  8.6*  --  8.9  --   --   --   MG  --   --  1.6*  --  2.0  --   --   --   PHOS  --   --   --   --  4.2  --   --   --    < > = values in  this interval not displayed.   GFR: Estimated Creatinine Clearance: 149.7 mL/min (by C-G formula based on SCr of 1 mg/dL).  Liver Function Tests: Recent Labs  Lab 02/12/17 1438  AST 61*  ALT 64*  ALKPHOS 78  BILITOT 2.7*  PROT 7.0  ALBUMIN 3.7    Coagulation Profile: Recent Labs  Lab 02/12/17 1416  INR 1.09    Cardiac Enzymes: Recent Labs  Lab 02/12/17 2110 02/13/17 0410 02/13/17 1115  TROPONINI 0.38* 0.30* 0.22*    HbA1C: Hgb A1c MFr Bld  Date/Time Value Ref Range Status  02/13/2017 01:31 AM 4.6 (L) 4.8 - 5.6 % Final    Comment:    (NOTE) Pre diabetes:          5.7%-6.4% Diabetes:              >6.4% Glycemic control for   <7.0% adults with diabetes      Recent Results (from the past 240 hour(s))  Respiratory Panel by PCR     Status: None   Collection Time: 02/12/17  3:40 PM  Result Value Ref Range Status   Adenovirus NOT DETECTED NOT DETECTED Final   Coronavirus 229E NOT DETECTED NOT DETECTED Final   Coronavirus HKU1 NOT DETECTED NOT DETECTED Final   Coronavirus NL63 NOT DETECTED NOT DETECTED Final   Coronavirus OC43 NOT DETECTED NOT DETECTED Final   Metapneumovirus NOT DETECTED NOT DETECTED Final   Rhinovirus / Enterovirus NOT DETECTED NOT DETECTED Final   Influenza A NOT DETECTED NOT DETECTED Final   Influenza B NOT DETECTED NOT DETECTED Final   Parainfluenza Virus 1 NOT DETECTED NOT DETECTED Final   Parainfluenza Virus 2 NOT DETECTED NOT DETECTED Final   Parainfluenza Virus 3 NOT DETECTED NOT DETECTED Final   Parainfluenza Virus 4 NOT DETECTED NOT DETECTED Final   Respiratory Syncytial Virus NOT DETECTED NOT DETECTED Final   Bordetella pertussis NOT DETECTED NOT DETECTED Final   Chlamydophila pneumoniae NOT DETECTED NOT DETECTED Final   Mycoplasma pneumoniae NOT DETECTED NOT DETECTED Final    Comment: Performed at [facility], [address] [place], [place] [address]  Surgical pcr screen     Status: None   Collection Time: 02/12/17   6:30 PM  Result  Value Ref Range Status   MRSA, PCR NEGATIVE NEGATIVE Final   Staphylococcus aureus NEGATIVE NEGATIVE Final    Comment: (NOTE) The Xpert SA Assay (FDA approved for NASAL specimens in patients 48 years of age and older), is one component of a comprehensive surveillance program. It is not intended to diagnose infection nor to guide or monitor treatment. Performed at [facility], [address], [place], [place] [address]   Culture, Urine     Status: Abnormal   Collection Time: 02/13/17 12:09 AM  Result Value Ref Range Status   Specimen Description URINE, CLEAN CATCH  Final   Special Requests NONE  Final   Culture MULTIPLE SPECIES PRESENT, SUGGEST RECOLLECTION (A)  Final   Report Status 02/14/2017 FINAL  Final  Blood culture (routine x 2)     Status: None (Preliminary result)   Collection Time: 02/13/17 12:22 AM  Result Value Ref Range Status   Specimen Description BLOOD LEFT HAND  Final   Special Requests IN PEDIATRIC BOTTLE Blood Culture adequate volume  Final   Culture NO GROWTH 3 DAYS  Final   Report Status PENDING  Incomplete     Scheduled Meds: . B-complex with vitamin C  1 tablet Oral Daily  . folic acid  2 mg Oral Daily  . pantoprazole  40 mg Oral Daily  . predniSONE  60 mg Oral Q breakfast     LOS: 4 days   Lonia Blood, MD [facility] Office  [phone] Pager - Text Page per Amion as per below:  On-Call/Text Page:      [email or web address]      password [email or web address]  If 7PM-7AM, please contact night-coverage [email or web address] Password [facility] 02/16/2017, 5:58 PM

## 2017-02-17 DIAGNOSIS — E876 Hypokalemia: Secondary | ICD-10-CM

## 2017-02-17 DIAGNOSIS — D696 Thrombocytopenia, unspecified: Secondary | ICD-10-CM

## 2017-02-17 DIAGNOSIS — D593 Hemolytic-uremic syndrome: Secondary | ICD-10-CM

## 2017-02-17 LAB — THERAPEUTIC PLASMA EXCHANGE (BLOOD BANK)
Plasma volume needed: 4360
UNIT DIVISION: 0
UNIT DIVISION: 0
UNIT DIVISION: 0
UNIT DIVISION: 0
UNIT DIVISION: 0
UNIT DIVISION: 0
Unit division: 0
Unit division: 0
Unit division: 0
Unit division: 0
Unit division: 0
Unit division: 0
Unit division: 0
Unit division: 0
Unit division: 0
Unit division: 0
Unit division: 0
Unit division: 0

## 2017-02-17 LAB — CBC WITH DIFFERENTIAL/PLATELET
BASOS PCT: 0 %
Basophils Absolute: 0 10*3/uL (ref 0.0–0.1)
Eosinophils Absolute: 0 10*3/uL (ref 0.0–0.7)
Eosinophils Relative: 0 %
HEMATOCRIT: 29.3 % — AB (ref 39.0–52.0)
HEMOGLOBIN: 9.2 g/dL — AB (ref 13.0–17.0)
LYMPHS PCT: 31 %
Lymphs Abs: 3.5 10*3/uL (ref 0.7–4.0)
MCH: 32.1 pg (ref 26.0–34.0)
MCHC: 31.4 g/dL (ref 30.0–36.0)
MCV: 102.1 fL — ABNORMAL HIGH (ref 78.0–100.0)
MONOS PCT: 7 %
Monocytes Absolute: 0.8 10*3/uL (ref 0.1–1.0)
NEUTROS ABS: 7 10*3/uL (ref 1.7–7.7)
NEUTROS PCT: 62 %
Platelets: 322 10*3/uL (ref 150–400)
RBC: 2.87 MIL/uL — ABNORMAL LOW (ref 4.22–5.81)
RDW: 25 % — ABNORMAL HIGH (ref 11.5–15.5)
WBC: 11.3 10*3/uL — ABNORMAL HIGH (ref 4.0–10.5)

## 2017-02-17 LAB — COMPREHENSIVE METABOLIC PANEL
ALT: 106 U/L — AB (ref 17–63)
AST: 38 U/L (ref 15–41)
Albumin: 3.1 g/dL — ABNORMAL LOW (ref 3.5–5.0)
Alkaline Phosphatase: 62 U/L (ref 38–126)
Anion gap: 10 (ref 5–15)
BILIRUBIN TOTAL: 0.9 mg/dL (ref 0.3–1.2)
BUN: 11 mg/dL (ref 6–20)
CHLORIDE: 101 mmol/L (ref 101–111)
CO2: 30 mmol/L (ref 22–32)
CREATININE: 1.08 mg/dL (ref 0.61–1.24)
Calcium: 8.1 mg/dL — ABNORMAL LOW (ref 8.9–10.3)
GFR calc Af Amer: >60 mL/min (ref >60)
GLUCOSE: 108 mg/dL — AB (ref 65–99)
Potassium: 2.5 mmol/L — CL (ref 3.5–5.1)
Sodium: 141 mmol/L (ref 135–145)
TOTAL PROTEIN: 5.2 g/dL — AB (ref 6.5–8.1)

## 2017-02-17 LAB — BASIC METABOLIC PANEL
Anion gap: 13 (ref 5–15)
BUN: 12 mg/dL (ref 6–20)
CHLORIDE: 104 mmol/L (ref 101–111)
CO2: 24 mmol/L (ref 22–32)
Calcium: 8.2 mg/dL — ABNORMAL LOW (ref 8.9–10.3)
Creatinine, Ser: 1.13 mg/dL (ref 0.61–1.24)
GFR calc non Af Amer: >60 mL/min (ref >60)
Glucose, Bld: 89 mg/dL (ref 65–99)
POTASSIUM: 5.2 mmol/L — AB (ref 3.5–5.1)
SODIUM: 141 mmol/L (ref 135–145)

## 2017-02-17 LAB — RETICULOCYTES
RBC.: 2.87 MIL/uL — ABNORMAL LOW (ref 4.22–5.81)
RETIC COUNT ABSOLUTE: 617.1 10*3/uL — AB (ref 19.0–186.0)
Retic Ct Pct: 21.5 % — ABNORMAL HIGH (ref 0.4–3.1)

## 2017-02-17 LAB — MAGNESIUM: MAGNESIUM: 1.8 mg/dL (ref 1.7–2.4)

## 2017-02-17 LAB — LACTATE DEHYDROGENASE: LDH: 240 U/L — ABNORMAL HIGH (ref 98–192)

## 2017-02-17 MED ORDER — DIPHENHYDRAMINE HCL 25 MG PO CAPS: 25.0000 mg | ORAL_CAPSULE | Freq: Four times a day (QID) | ORAL | Status: DC | PRN

## 2017-02-17 MED ORDER — SODIUM CHLORIDE 0.9% FLUSH: 10.0000 mL | INTRAVENOUS | Status: DC | PRN

## 2017-02-17 MED ORDER — ACD FORMULA A 0.73-2.45-2.2 GM/100ML VI SOLN: 500.0000 mL | Status: DC

## 2017-02-17 MED ORDER — CALCIUM GLUCONATE 10 % IV SOLN
2.0000 g | Freq: Once | INTRAVENOUS | Status: AC
  Administered 2017-02-17: 2 g via INTRAVENOUS
  Filled 2017-02-17: qty 20

## 2017-02-17 MED ORDER — ACD FORMULA A 0.73-2.45-2.2 GM/100ML VI SOLN
Status: AC
  Filled 2017-02-17: qty 500

## 2017-02-17 MED ORDER — POTASSIUM CHLORIDE CRYS ER 20 MEQ PO TBCR
40.0000 meq | EXTENDED_RELEASE_TABLET | ORAL | Status: AC
  Administered 2017-02-17 (×2): 40 meq via ORAL
  Filled 2017-02-17 (×2): qty 2

## 2017-02-17 MED ORDER — CALCIUM CARBONATE ANTACID 500 MG PO CHEW
CHEWABLE_TABLET | ORAL | Status: AC
  Filled 2017-02-17: qty 2

## 2017-02-17 MED ORDER — ANTICOAGULANT SODIUM CITRATE 4% (200MG/5ML) IV SOLN
5.0000 mL | Freq: Once | Status: AC
  Administered 2017-02-17: 5 mL
  Filled 2017-02-17: qty 5

## 2017-02-17 MED ORDER — CALCIUM CARBONATE ANTACID 500 MG PO CHEW
2.0000 | CHEWABLE_TABLET | ORAL | Status: AC
  Administered 2017-02-17 (×2): 400 mg via ORAL
  Filled 2017-02-17: qty 2

## 2017-02-17 MED ORDER — ACETAMINOPHEN 325 MG PO TABS: 650.0000 mg | ORAL_TABLET | ORAL | Status: DC | PRN

## 2017-02-17 MED ORDER — ACD FORMULA A 0.73-2.45-2.2 GM/100ML VI SOLN
Status: AC
  Administered 2017-02-17: 17:00:00
  Filled 2017-02-17: qty 500

## 2017-02-17 NOTE — Progress Notes (Signed)
Subjective: Interval History: has no complaint, platelets up > 300k  Objective: Vital signs in last 24 hours: Temp:  [98.2 F (36.8 C)-98.9 F (37.2 C)] 98.2 F (36.8 C) (01/23 0500) Pulse Rate:  [75-93] 75 (01/23 0500) Resp:  [18-27] 18 (01/23 0500) BP: (131-174)/(70-95) 131/70 (01/23 0500) SpO2:  [97 %-99 %] 99 % (01/23 0500) Weight change:   Intake/Output from previous day: 01/22 0701 - 01/23 0700 In: 400 [P.O.:400] Out: -  Intake/Output this shift: Total I/O In: 250 [P.O.:250] Out: -   General appearance: alert, cooperative, no distress and mildly obese Neck: RIJ cath Resp: clear to auscultation bilaterally Cardio: S1, S2 normal and systolic murmur: holosystolic 2/6, blowing at apex GI: obese, pos bs Extremities: extremities normal, atraumatic, no cyanosis or edema  Lab Results: Recent Labs    02/16/17 1417 02/17/17 0852  WBC 26.8* 15.6*  HGB 8.6*  10.2* 9.2*  HCT 26.4*  30.0* 29.3*  PLT 299 322   BMET:  Recent Labs    02/16/17 1417 02/17/17 0852  NA 140 141  K 3.5 2.5*  CL 98* 101  CO2  --  30  GLUCOSE 153* 108*  BUN 13 11  CREATININE 1.00 1.08  CALCIUM  --  8.1*   No results for input(s): PTH in the last 72 hours. Iron Studies:  No results for input(s): IRON, TIBC, TRANSFERRIN, FERRITIN in the last 72 hours.  Studies/Results: No results found.  I have reviewed the patient's current medications.  Assessment/Plan: 1 TMA - platelets > 300k. Responded well to TPE. Further rx per Heme-Onc. Consult IV team for temp HD cath removal prior to dc.  Will sign off.    LOS: 5 days   Maree KrabbeRobert D Bethel Sirois 02/17/2017,12:07 PM

## 2017-02-17 NOTE — Progress Notes (Signed)
PROGRESS NOTE    Peretz Thieme   ZHY:[id number]  DOB: 02-27-1990  DOA: 02/12/2017 PCP: Patient, No Pcp Per   Brief Narrative:  Fredirick Maudlin 27 year old with history of sickle cell trait admitted with right-sided paresthesia after flu-like illness.  CT head and MRI were nondiagnostic. Found to have severe anemia and thrombocytopenia with schistocytes. Diagnosedwith TTP.  Transferred to [facility] and plasmapheresis initiated.     Subjective: He feels mild tingling in his face. No other complaints.  ROS: no complaints of nausea, vomiting, constipation diarrhea, cough, dyspnea or dysuria. No other complaints.   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 Severe thormbocytopenia  Acute microangiopathy hemolytic anemia - management per hematology- platelets have normalized and hemolysis has resolved - plan is for last plasmapheresis treatment to be done tomorrow and then for him to be discharged on 50 mg of Prednisone daily- he will follow up with Dr Candise Che in the office next week  And prednisone taper will be further determined by Dr Candise Che - Hb stable after 2 U PRBC given - cont Folic acid and B12 replacement  Severe hypokalemia - replace- Mg found to be normal  Mildly elevated troponin and sinus tachycardia - likely stress related vs chronically elevated troponin-    Troponin I Latest Ref Range: <0.03 ng/mL 0.38 (HH) 0.30 (HH) 0.22 (HH)    DVT prophylaxis: SCDs Code Status: Full code Family Communication:  Disposition Plan: home tomorrow Consultants:   Oncology  Nephrology  Procedures:   Temp dialysis cath Antimicrobials:  Anti-infectives (From admission, onward)   None       Objective: Vitals:   02/16/17 1618 02/16/17 1800 02/16/17 2108 02/17/17 0500  BP: (!) 171/93 (!) 150/95 (!) 146/72 131/70  Pulse: 92 80 90 75  Resp: (!) 21 18 18 18   Temp: 98.3 F (36.8 C) 98.3 F (36.8 C) 98.4 F (36.9 C) 98.2  F (36.8 C)  TempSrc: Oral Oral Oral Oral  SpO2: 98% 97% 99% 99%  Weight:      Height:        Intake/Output Summary (Last 24 hours) at 02/17/2017 1439 Last data filed at 02/17/2017 1300 Gross per 24 hour  Intake 450 ml  Output -  Net 450 ml   Filed Weights   02/14/17 1250 02/15/17 0500 02/16/17 0501  Weight: 123.8 kg (272 lb 14.9 oz) 126.7 kg (279 lb 5.2 oz) 125.6 kg (277 lb)    Examination: General exam: Appears comfortable  HEENT: PERRLA, oral mucosa moist, no sclera icterus or thrush Respiratory system: Clear to auscultation. Respiratory effort normal. Cardiovascular system: S1 & S2 heard, RRR.  No murmurs  Gastrointestinal system: Abdomen soft, non-tender, nondistended. Normal bowel sound. No organomegaly Central nervous system: Alert and oriented. No focal neurological deficits. Extremities: No cyanosis, clubbing or edema Skin: No rashes or ulcers Psychiatry:  Mood & affect appropriate.     Data Reviewed: I have personally reviewed following labs and imaging studies  CBC: Recent Labs  Lab 02/12/17 1416  02/13/17 1119  02/14/17 0427 02/15/17 0434 02/15/17 1029 02/16/17 1401 02/16/17 1417 02/17/17 0852  WBC 22.0*   < > 28.7*  --  34.9* 32.6*  --   --  26.8* 11.3*  NEUTROABS 16.2*  --   --   --  28.7* 27.1*  --   --  20.6* 7.0  HGB 5.8*   < > 7.8*   < > 8.0* 7.3* 7.8* 6.5* 8.6*  10.2* 9.2*  HCT  17.2*   < > 23.4*   < > 23.6* 21.7* 23.0* 19.0* 26.4*  30.0* 29.3*  MCV 95.6   < > 92.9  --  95.9 98.6  --   --  100.0 102.1*  PLT 16*   < > 20*  --  74* 152  --   --  299 322   < > = values in this interval not displayed.   Basic Metabolic Panel: Recent Labs  Lab 02/12/17 1438  02/13/17 0410  02/14/17 0427 02/15/17 1029 02/16/17 1401 02/16/17 1417 02/17/17 0852  NA 136   < > 139   < > 140 140 153* 140 141  K 3.7   < > 3.6   < > 4.0 3.4* 2.5* 3.5 2.5*  CL 105   < > 103   < > 104 97* 98* 98* 101  CO2 24  --  24  --  26  --   --   --  30  GLUCOSE 158*   < >  148*   < > 148* 178* 133* 153* 108*  BUN 13   < > 12   < > 17 17 10 13 11   CREATININE 1.09   < > 1.07   < > 1.05 1.00 0.80 1.00 1.08  CALCIUM 8.6*  --  8.6*  --  8.9  --   --   --  8.1*  MG  --   --  1.6*  --  2.0  --   --   --  1.8  PHOS  --   --   --   --  4.2  --   --   --   --    < > = values in this interval not displayed.   GFR: Estimated Creatinine Clearance: 138.6 mL/min (by C-G formula based on SCr of 1.08 mg/dL). Liver Function Tests: Recent Labs  Lab 02/12/17 1438 02/17/17 0852  AST 61* 38  ALT 64* 106*  ALKPHOS 78 62  BILITOT 2.7* 0.9  PROT 7.0 5.2*  ALBUMIN 3.7 3.1*   No results for input(s): LIPASE, AMYLASE in the last 168 hours. No results for input(s): AMMONIA in the last 168 hours. Coagulation Profile: Recent Labs  Lab 02/12/17 1416  INR 1.09   Cardiac Enzymes: Recent Labs  Lab 02/12/17 2110 02/13/17 0410 02/13/17 1115  TROPONINI 0.38* 0.30* 0.22*   BNP (last 3 results) No results for input(s): PROBNP in the last 8760 hours. HbA1C: No results for input(s): HGBA1C in the last 72 hours. CBG: No results for input(s): GLUCAP in the last 168 hours. Lipid Profile: No results for input(s): CHOL, HDL, LDLCALC, TRIG, CHOLHDL, LDLDIRECT in the last 72 hours. Thyroid Function Tests: No results for input(s): TSH, T4TOTAL, FREET4, T3FREE, THYROIDAB in the last 72 hours. Anemia Panel: Recent Labs    02/16/17 1417 02/17/17 0852  RETICCTPCT 19.8* 21.5*   Urine analysis:    Component Value Date/Time   COLORURINE YELLOW 02/12/2017 2102   APPEARANCEUR CLEAR 02/12/2017 2102   LABSPEC 1.024 02/12/2017 2102   PHURINE 6.0 02/12/2017 2102   GLUCOSEU NEGATIVE 02/12/2017 2102   HGBUR MODERATE (A) 02/12/2017 2102   BILIRUBINUR NEGATIVE 02/12/2017 2102   KETONESUR NEGATIVE 02/12/2017 2102   PROTEINUR NEGATIVE 02/12/2017 2102   UROBILINOGEN 0.2 04/19/2013 1617   NITRITE NEGATIVE 02/12/2017 2102   LEUKOCYTESUR NEGATIVE 02/12/2017 2102   Sepsis  Labs: @LABRCNTIP (procalcitonin:4,lacticidven:4) ) Recent Results (from the past 240 hour(s))  Respiratory Panel by PCR  Status: None   Collection Time: 02/12/17  3:40 PM  Result Value Ref Range Status   Adenovirus NOT DETECTED NOT DETECTED Final   Coronavirus 229E NOT DETECTED NOT DETECTED Final   Coronavirus HKU1 NOT DETECTED NOT DETECTED Final   Coronavirus NL63 NOT DETECTED NOT DETECTED Final   Coronavirus OC43 NOT DETECTED NOT DETECTED Final   Metapneumovirus NOT DETECTED NOT DETECTED Final   Rhinovirus / Enterovirus NOT DETECTED NOT DETECTED Final   Influenza A NOT DETECTED NOT DETECTED Final   Influenza B NOT DETECTED NOT DETECTED Final   Parainfluenza Virus 1 NOT DETECTED NOT DETECTED Final   Parainfluenza Virus 2 NOT DETECTED NOT DETECTED Final   Parainfluenza Virus 3 NOT DETECTED NOT DETECTED Final   Parainfluenza Virus 4 NOT DETECTED NOT DETECTED Final   Respiratory Syncytial Virus NOT DETECTED NOT DETECTED Final   Bordetella pertussis NOT DETECTED NOT DETECTED Final   Chlamydophila pneumoniae NOT DETECTED NOT DETECTED Final   Mycoplasma pneumoniae NOT DETECTED NOT DETECTED Final    Comment: Performed at [facility], [address] [place], [place] [address]  Surgical pcr screen     Status: None   Collection Time: 02/12/17  6:30 PM  Result Value Ref Range Status   MRSA, PCR NEGATIVE NEGATIVE Final   Staphylococcus aureus NEGATIVE NEGATIVE Final    Comment: (NOTE) The Xpert SA Assay (FDA approved for NASAL specimens in patients 28 years of age and older), is one component of a comprehensive surveillance program. It is not intended to diagnose infection nor to guide or monitor treatment. Performed at [facility], [address] [place], [place] [address]   Culture, Urine     Status: Abnormal   Collection Time: 02/13/17 12:09 AM  Result Value Ref Range Status   Specimen Description URINE, CLEAN CATCH  Final   Special Requests NONE  Final   Culture  MULTIPLE SPECIES PRESENT, SUGGEST RECOLLECTION (A)  Final   Report Status 02/14/2017 FINAL  Final  Blood culture (routine x 2)     Status: None (Preliminary result)   Collection Time: 02/13/17 12:22 AM  Result Value Ref Range Status   Specimen Description BLOOD LEFT HAND  Final   Special Requests IN PEDIATRIC BOTTLE Blood Culture adequate volume  Final   Culture NO GROWTH 4 DAYS  Final   Report Status PENDING  Incomplete         Radiology Studies: No results found.    Scheduled Meds: . B-complex with vitamin C  1 tablet Oral Daily  . calcium carbonate  2 tablet Oral Q3H  . folic acid  2 mg Oral Daily  . pantoprazole  40 mg Oral Daily  . potassium chloride  40 mEq Oral Q4H  . predniSONE  60 mg Oral Q breakfast   Continuous Infusions: . sodium chloride    . anticoagulant sodium citrate    . calcium gluconate IVPB    . citrate dextrose       LOS: 5 days    Time spent in minutes: 35    Calvert CantorSaima Lovelyn Sheeran, MD [facility] Pager: [email or web address] Password [email or web address] 02/17/2017, 2:39 PM

## 2017-02-18 ENCOUNTER — Other Ambulatory Visit: Payer: Self-pay | Admitting: *Deleted

## 2017-02-18 DIAGNOSIS — M311 Thrombotic microangiopathy: Secondary | ICD-10-CM

## 2017-02-18 DIAGNOSIS — D599 Acquired hemolytic anemia, unspecified: Secondary | ICD-10-CM

## 2017-02-18 DIAGNOSIS — M3119 Other thrombotic microangiopathy: Secondary | ICD-10-CM

## 2017-02-18 LAB — THERAPEUTIC PLASMA EXCHANGE (BLOOD BANK)
Plasma Exchange: 4362
Plasma volume needed: 4360
UNIT DIVISION: 0
UNIT DIVISION: 0
UNIT DIVISION: 0
UNIT DIVISION: 0
UNIT DIVISION: 0
UNIT DIVISION: 0
UNIT DIVISION: 0
Unit division: 0
Unit division: 0
Unit division: 0
Unit division: 0
Unit division: 0
Unit division: 0
Unit division: 0
Unit division: 0
Unit division: 0
Unit division: 0

## 2017-02-18 LAB — CBC WITH DIFFERENTIAL/PLATELET
BASOS PCT: 1 %
Basophils Absolute: 0.1 10*3/uL (ref 0.0–0.1)
EOS ABS: 0.1 10*3/uL (ref 0.0–0.7)
Eosinophils Relative: 1 %
HCT: 29.3 % — ABNORMAL LOW (ref 39.0–52.0)
Hemoglobin: 9.5 g/dL — ABNORMAL LOW (ref 13.0–17.0)
Lymphocytes Relative: 40 %
Lymphs Abs: 4.5 10*3/uL — ABNORMAL HIGH (ref 0.7–4.0)
MCH: 33 pg (ref 26.0–34.0)
MCHC: 32.4 g/dL (ref 30.0–36.0)
MCV: 101.7 fL — AB (ref 78.0–100.0)
MONO ABS: 0.9 10*3/uL (ref 0.1–1.0)
Monocytes Relative: 8 %
Neutro Abs: 5.7 10*3/uL (ref 1.7–7.7)
Neutrophils Relative %: 50 %
PLATELETS: 334 10*3/uL (ref 150–400)
RBC: 2.88 MIL/uL — ABNORMAL LOW (ref 4.22–5.81)
RDW: 23.4 % — ABNORMAL HIGH (ref 11.5–15.5)
WBC: 11.3 10*3/uL — ABNORMAL HIGH (ref 4.0–10.5)

## 2017-02-18 LAB — POCT I-STAT, CHEM 8
BUN: 10 mg/dL (ref 6–20)
BUN: 11 mg/dL (ref 6–20)
BUN: 9 mg/dL (ref 6–20)
CALCIUM ION: 1.1 mmol/L — AB (ref 1.15–1.40)
CALCIUM ION: 0.98 mmol/L — AB (ref 1.15–1.40)
CHLORIDE: 98 mmol/L — AB (ref 101–111)
CHLORIDE: 96 mmol/L — AB (ref 101–111)
Chloride: 95 mmol/L — ABNORMAL LOW (ref 101–111)
Creatinine, Ser: 0.8 mg/dL (ref 0.61–1.24)
Creatinine, Ser: 1 mg/dL (ref 0.61–1.24)
Creatinine, Ser: 0.9 mg/dL (ref 0.61–1.24)
GLUCOSE: 133 mg/dL — AB (ref 65–99)
Glucose, Bld: 140 mg/dL — ABNORMAL HIGH (ref 65–99)
Glucose, Bld: 115 mg/dL — ABNORMAL HIGH (ref 65–99)
HCT: 19 % — ABNORMAL LOW (ref 39.0–52.0)
HCT: 27 % — ABNORMAL LOW (ref 39.0–52.0)
HEMATOCRIT: 27 % — AB (ref 39.0–52.0)
HEMOGLOBIN: 6.5 g/dL — AB (ref 13.0–17.0)
Hemoglobin: 9.2 g/dL — ABNORMAL LOW (ref 13.0–17.0)
Hemoglobin: 9.2 g/dL — ABNORMAL LOW (ref 13.0–17.0)
POTASSIUM: 2.5 mmol/L — AB (ref 3.5–5.1)
Potassium: 3.5 mmol/L (ref 3.5–5.1)
Potassium: 2.8 mmol/L — ABNORMAL LOW (ref 3.5–5.1)
SODIUM: 140 mmol/L (ref 135–145)
SODIUM: 140 mmol/L (ref 135–145)
Sodium: 153 mmol/L — ABNORMAL HIGH (ref 135–145)
TCO2: 25 mmol/L (ref 22–32)
TCO2: 28 mmol/L (ref 22–32)
TCO2: 29 mmol/L (ref 22–32)

## 2017-02-18 LAB — HEPATITIS PANEL, ACUTE
HEP A IGM: NEGATIVE
HEP B S AG: NEGATIVE
Hep B C IgM: NEGATIVE

## 2017-02-18 LAB — BASIC METABOLIC PANEL
Anion gap: 10 (ref 5–15)
Anion gap: 12 (ref 5–15)
BUN: 10 mg/dL (ref 6–20)
BUN: 9 mg/dL (ref 6–20)
CALCIUM: 8.1 mg/dL — AB (ref 8.9–10.3)
CHLORIDE: 99 mmol/L — AB (ref 101–111)
CO2: 27 mmol/L (ref 22–32)
CO2: 26 mmol/L (ref 22–32)
CREATININE: 1.12 mg/dL (ref 0.61–1.24)
Calcium: 7.9 mg/dL — ABNORMAL LOW (ref 8.9–10.3)
Chloride: 100 mmol/L — ABNORMAL LOW (ref 101–111)
Creatinine, Ser: 1.11 mg/dL (ref 0.61–1.24)
GFR calc Af Amer: >60 mL/min (ref >60)
GFR calc non Af Amer: >60 mL/min (ref >60)
GLUCOSE: 129 mg/dL — AB (ref 65–99)
GLUCOSE: 165 mg/dL — AB (ref 65–99)
POTASSIUM: 2.7 mmol/L — AB (ref 3.5–5.1)
Potassium: 3.6 mmol/L (ref 3.5–5.1)
Sodium: 137 mmol/L (ref 135–145)
Sodium: 137 mmol/L (ref 135–145)

## 2017-02-18 LAB — CULTURE, BLOOD (ROUTINE X 2)
Culture: NO GROWTH
Special Requests: ADEQUATE

## 2017-02-18 MED ORDER — DIPHENHYDRAMINE HCL 25 MG PO CAPS: 25.0000 mg | ORAL_CAPSULE | Freq: Four times a day (QID) | ORAL | Status: DC | PRN

## 2017-02-18 MED ORDER — CALCIUM CARBONATE ANTACID 500 MG PO CHEW
CHEWABLE_TABLET | ORAL | Status: AC
  Administered 2017-02-18: 400 mg
  Filled 2017-02-18: qty 2

## 2017-02-18 MED ORDER — POTASSIUM CHLORIDE CRYS ER 20 MEQ PO TBCR
40.0000 meq | EXTENDED_RELEASE_TABLET | ORAL | Status: AC
  Administered 2017-02-18 (×2): 40 meq via ORAL
  Filled 2017-02-18 (×2): qty 2

## 2017-02-18 MED ORDER — ACD FORMULA A 0.73-2.45-2.2 GM/100ML VI SOLN
Status: AC
  Filled 2017-02-18: qty 500

## 2017-02-18 MED ORDER — ACD FORMULA A 0.73-2.45-2.2 GM/100ML VI SOLN
500.0000 mL | Status: DC
  Administered 2017-02-18: 500 mL via INTRAVENOUS
  Filled 2017-02-18: qty 500

## 2017-02-18 MED ORDER — VITAMIN B-12 1000 MCG PO TABS: 1000.0000 ug | ORAL_TABLET | Freq: Every day | ORAL | 0 refills | Status: DC

## 2017-02-18 MED ORDER — ANTICOAGULANT SODIUM CITRATE 4% (200MG/5ML) IV SOLN
5.0000 mL | Freq: Once | Status: DC
  Filled 2017-02-18: qty 250

## 2017-02-18 MED ORDER — ACETAMINOPHEN 325 MG PO TABS: 650.0000 mg | ORAL_TABLET | ORAL | Status: DC | PRN

## 2017-02-18 MED ORDER — SODIUM CHLORIDE 0.9 % IV SOLN
2.0000 g | Freq: Once | INTRAVENOUS | Status: AC
  Administered 2017-02-18: 2 g via INTRAVENOUS
  Filled 2017-02-18: qty 20

## 2017-02-18 MED ORDER — CALCIUM CARBONATE ANTACID 500 MG PO CHEW
2.0000 | CHEWABLE_TABLET | ORAL | Status: AC
  Administered 2017-02-18: 400 mg via ORAL
  Filled 2017-02-18 (×2): qty 2

## 2017-02-18 MED ORDER — PREDNISONE 10 MG PO TABS: 50.0000 mg | ORAL_TABLET | Freq: Every day | ORAL | 0 refills | Status: DC

## 2017-02-18 MED ORDER — MULTI-VITAMIN/MINERALS PO TABS: 1.0000 | ORAL_TABLET | Freq: Every day | ORAL | 2 refills | Status: DC

## 2017-02-18 MED ORDER — FOLIC ACID 1 MG PO TABS: 1.0000 mg | ORAL_TABLET | Freq: Every day | ORAL | 0 refills | Status: DC

## 2017-02-18 NOTE — Progress Notes (Signed)
Instructed patient on procedure. HOB less than 45*. Pt. Held breath for line removal. Pressure held for 10min. No s/sx of bleeding. Pressure drsg applied and instructed pt to keep CDI for 24 hours. Pt instructed to remain in bed for 30 min and to report any s/sx of bleeding. Pt VU. Richard MorrowHeather Gianlucca Szymborski, RN VAST

## 2017-02-18 NOTE — Progress Notes (Signed)
HEMATOLOGY/ONCOLOGY INPATIENT PROGRESS NOTE  Date of Service: 02/18/2017  Inpatient Attending: .Calvert Cantor, MD   SUBJECTIVE  Patient notes no acute new symptoms. Tolerating plasma exchange without any issues. Platelet up to 334k. Hgb improved to 9.5. No FND. No other acute new symptoms. Hypokalemia being mx by hospitalist.  OBJECTIVE:  NAD  PHYSICAL EXAMINATION: . Vitals:   02/17/17 1817 02/17/17 1953 02/18/17 0500 02/18/17 0502  BP: (!) 167/96 (!) 149/81  (!) 146/94  Pulse: 94 90  94  Resp: (!) 25 20  19   Temp: 98.7 F (37.1 C) 98.1 F (36.7 C)  98.7 F (37.1 C)  TempSrc: Oral Oral    SpO2: 98% 100%  100%  Weight:   276 lb 14.4 oz (125.6 kg)   Height:       Filed Weights   02/15/17 0500 02/16/17 0501 02/18/17 0500  Weight: 279 lb 5.2 oz (126.7 kg) 277 lb (125.6 kg) 276 lb 14.4 oz (125.6 kg)   .Body mass index is 38.62 kg/m.  GENERAL:alert, in no acute distress and comfortable SKIN: few petechiae/small ecchymosis on extremities EYES: normal, conjunctiva are pink and non-injected, sclera clear OROPHARYNX:no exudate, no erythema and lips, buccal mucosa, and tongue normal  NECK: supple, no JVD, thyroid normal size, non-tender, without nodularity LYMPH:  no palpable lymphadenopathy in the cervical, axillary or inguinal LUNGS: clear to auscultation with normal respiratory effort HEART: regular rate & rhythm,  no murmurs and no lower extremity edema ABDOMEN: abdomen soft, non-tender, normoactive bowel sounds  Musculoskeletal: no cyanosis of digits and no clubbing  PSYCH: alert & oriented x 3 with fluent speech NEURO: no focal motor/sensory deficits  MEDICAL HISTORY:  Past Medical History:  Diagnosis Date  . Sickle cell trait (HCC)     SURGICAL HISTORY: History reviewed. No pertinent surgical history.  SOCIAL HISTORY: Social History   Socioeconomic History  . Marital status: Single    Spouse name: Not on file  . Number of children: Not on file  .  Years of education: Not on file  . Highest education level: Not on file  Social Needs  . Financial resource strain: Not on file  . Food insecurity - worry: Not on file  . Food insecurity - inability: Not on file  . Transportation needs - medical: Not on file  . Transportation needs - non-medical: Not on file  Occupational History  . Not on file  Tobacco Use  . Smoking status: Current Every Day Smoker  . Smokeless tobacco: Never Used  Substance and Sexual Activity  . Alcohol use: Yes    Comment: occ  . Drug use: No  . Sexual activity: Not on file  Other Topics Concern  . Not on file  Social History Narrative  . Not on file    FAMILY HISTORY: Family History  Problem Relation Age of Onset  . Cancer Other   . Diabetes Other   . Hypertension Other     ALLERGIES:  has No Known Allergies.  MEDICATIONS:  Scheduled Meds: . B-complex with vitamin C  1 tablet Oral Daily  . folic acid  2 mg Oral Daily  . pantoprazole  40 mg Oral Daily  . predniSONE  60 mg Oral Q breakfast   Continuous Infusions: . sodium chloride    . anticoagulant sodium citrate    . calcium gluconate IVPB    . citrate dextrose    . citrate dextrose     PRN Meds:.sodium chloride, acetaminophen, diphenhydrAMINE, sodium chloride flush  REVIEW  OF SYSTEMS:    10 Point review of Systems was done is negative except as noted above.   LABORATORY DATA:  I have reviewed the data as listed  CBC Latest Ref Rng & Units 02/18/2017 02/18/2017 02/17/2017  WBC 4.0 - 10.5 K/uL - 11.3(H) -  Hemoglobin 13.0 - 17.0 g/dL 1.6(X9.2(L) 0.9(U9.5(L) 0.4(V9.2(L)  Hematocrit 39.0 - 52.0 % 27.0(L) 29.3(L) 27.0(L)  Platelets 150 - 400 K/uL - 334 -    CMP Latest Ref Rng & Units 02/18/2017 02/18/2017 02/17/2017  Glucose 65 - 99 mg/dL 409(W115(H) 119(J129(H) 89  BUN 6 - 20 mg/dL 9 10 12   Creatinine 0.61 - 1.24 mg/dL 4.780.90 2.951.11 6.211.13  Sodium 135 - 145 mmol/L 140 137 141  Potassium 3.5 - 5.1 mmol/L 2.8(L) 2.7(LL) 5.2(H)  Chloride 101 - 111 mmol/L 95(L)  100(L) 104  CO2 22 - 32 mmol/L - 27 24  Calcium 8.9 - 10.3 mg/dL - 7.9(L) 8.2(L)  Total Protein 6.5 - 8.1 g/dL - - -  Total Bilirubin 0.3 - 1.2 mg/dL - - -  Alkaline Phos 38 - 126 U/L - - -  AST 15 - 41 U/L - - -  ALT 17 - 63 U/L - - -   ADAM TS 13 --levels pending   RADIOGRAPHIC STUDIES: I have personally reviewed the radiological images as listed and agreed with the findings in the report. Dg Chest 2 View  Result Date: 02/12/2017 CLINICAL DATA:  Shortness of Breath EXAM: CHEST  2 VIEW COMPARISON:  None. FINDINGS: Lungs are clear. Heart size and pulmonary vascularity are normal. No adenopathy. No bone lesions. IMPRESSION: No edema or consolidation. Electronically Signed   By: Bretta BangWilliam  Woodruff III M.D.   On: 02/12/2017 14:23   Ct Head Wo Contrast  Result Date: 02/12/2017 CLINICAL DATA:  Right-sided numbness. History of sickle cell disease EXAM: CT HEAD WITHOUT CONTRAST TECHNIQUE: Contiguous axial images were obtained from the base of the skull through the vertex without intravenous contrast. COMPARISON:  None. FINDINGS: Brain: The ventricles are normal in size and configuration. There is no intracranial mass, hemorrhage, extra-axial fluid collection, or midline shift. Gray-white compartments are normal. No acute infarct evident. Vascular: No hyperdense vessels.  No vascular calcification evident. Skull: The bony calvarium appears intact. Sinuses/Orbits: Visualized paranasal sinuses are clear. Orbits appear symmetric bilaterally. Other: Mastoid air cells are clear. IMPRESSION: Study within normal limits. Electronically Signed   By: Bretta BangWilliam  Woodruff III M.D.   On: 02/12/2017 07:34   Ct Angio Chest Pe W And/or Wo Contrast  Result Date: 02/12/2017 CLINICAL DATA:  Shortness of breath with exertion. Elevated D-dimer. Evaluate for pulmonary embolism. EXAM: CT ANGIOGRAPHY CHEST WITH CONTRAST TECHNIQUE: Multidetector CT imaging of the chest was performed using the standard protocol during bolus  administration of intravenous contrast. Multiplanar CT image reconstructions and MIPs were obtained to evaluate the vascular anatomy. CONTRAST:  100mL ISOVUE-370 IOPAMIDOL (ISOVUE-370) INJECTION 76% COMPARISON:  Chest x-ray from same day. FINDINGS: Cardiovascular: Suboptimal opacification of the pulmonary arteries due to bolus timing and patient's body habitus. No central or lobar pulmonary embolism. Normal heart size. No pericardial effusion. Normal caliber thoracic aorta. Mediastinum/Nodes: No enlarged mediastinal, hilar, or axillary lymph nodes. Thyroid gland, trachea, and esophagus demonstrate no significant findings. Lungs/Pleura: Lungs are clear. No pleural effusion or pneumothorax. Upper Abdomen: No acute abnormality. Musculoskeletal: Bilateral gynecomastia. No acute or significant osseous findings. Review of the MIP images confirms the above findings. IMPRESSION: 1. No central or lobar pulmonary embolism. The segmental pulmonary arteries are not  well evaluated due to bolus timing and patient's body habitus. 2. No acute intrathoracic process. Electronically Signed   By: Obie Dredge M.D.   On: 02/12/2017 16:06   Mr Brain Wo Contrast  Result Date: 02/12/2017 CLINICAL DATA:  Severe headache while at work last night. RIGHT-sided numbness. Ataxia, suspect stroke. History of sickle cell trait. EXAM: MRI HEAD WITHOUT CONTRAST TECHNIQUE: Axial and coronal diffusion weighted imaging, sagittal T1, axial T2 and axial T2 FLAIR obtained on a 1.5 tesla scanner. Patient was unable to tolerate further imaging. COMPARISON:  CT HEAD February 13, 2016 at 0709 hours. FINDINGS: Sequences are mildly motion degraded. BRAIN: No reduced diffusion to suggest acute ischemia, hyperacute demyelination or hypercellular tumor. No field inhomogeneity to suggest blood products though susceptibility weighted sequence not obtained. No intrinsic T1 shortening. He ventricles and sulci are normal for patient's age. No suspicious  parenchymal signal, mass or mass effect. No abnormal extra-axial fluid collections. VASCULAR: Normal major vascular flow voids present at skull base. SKULL AND UPPER CERVICAL SPINE: Mild prevertebral soft tissue fullness. No abnormal sellar expansion. No suspicious calvarial bone marrow signal. Craniocervical junction maintained. SINUSES/ORBITS: Included paranasal sinuses are well aerated. Imaged mastoid air cells are well aerated. The included ocular globes and orbital contents are non-suspicious. OTHER: None. IMPRESSION: 1. Limited motion degraded MRI, patient was unable to tolerate complete examination. 2. Negative noncontrast MRI of the head. 3. Prevertebral fullness, seen with recent viral illness or immunocompromised states. Electronically Signed   By: Awilda Metro M.D.   On: 02/12/2017 13:49   Dg Chest Port 1 View  Result Date: 02/12/2017 CLINICAL DATA:  Catheter placement. EXAM: PORTABLE CHEST 1 VIEW COMPARISON:  Earlier today FINDINGS: Interval placement of right IJ catheter with tip in the projection of the distal SVC. No pneumothorax visualized. The heart size and mediastinal contours are within normal limits. Both lungs are clear. The visualized skeletal structures are unremarkable. IMPRESSION: No active disease. Electronically Signed   By: Signa Kell M.D.   On: 02/12/2017 20:37    ASSESSMENT & PLAN:   27 y.o. 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 On presentation hemoglobin is down to 5.8 with elevated LDH of 1300s. LDH improved to 200's. hgb up to 9.5 with Robust reticulocytosis. -HIV test -non reactive  2. 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  Platelets were 16k on initial presentation. Improved to 334k this AM With no overt bleeding noted at this time.  3. Left sided mild weakness/numbness - resolved No overt CVA at this time an  MRI.  Plan - plasma exchange today 02/19/2016 --I have sent scheduling for f/u with labs on Tuesday 12/24/2017 in hematology clinic with me at the [facility]. If patient does not receive a call- he should call the cancer center at [phone] and confirm the appointment. He has been given a visiting card with instructions to call and f/u. --would remove catheter prior to discharge. -if concern/need for ongoing outpatient plasma-ex will then decide to have a tunneled catheter placed. -continue Prednisone with taper over 2 weeks. Would discharge on 50mg  po daily -- We shall plan taper as outpatient. -if he does relapse or his ADAM TS 13 level < 10% with high risk of relapse - will need to consider Rituxan. - PRBC transfusion prn to maintain Hgb >7 or if symptomatic (has not needed this since plasma exchange has be  initiated) -plz discharge on folic acid 2 mg, B12 1000 mcg daily -patient counseled about concerning clinical symptoms and signs to watch for and counseled to return to ED stat if any of these concerns arise. -avoid NSAIDS.  4. URI symptoms  -Viral respiratory panel - neg. Symptoms resolved  5. Elevated ferritin on admission - due to overt hemolysis.  6. Hypokalemia - mx per hospitalist.  F/u with Dr Candise Che on 02/23/2017 with labs   I spent 25 minutes counseling the patient face to face. The total time spent in the appointment was 35 minutes and more than 50% was on counseling and direct patient cares and co-ordination of cares    Wyvonnia Lora MD MS AAHIVMS [facility] Hematology/Oncology [facility]  (Office):       [phone] (Work cell):  [phone] (Fax):           [phone]  02/18/2017 10:41 AM

## 2017-02-18 NOTE — Discharge Summary (Signed)
Physician Discharge Summary  Jimi Schappert RUE:[id number] DOB: [birth year] DOA: 02/12/2017  PCP: Patient, No Pcp Per  Admit date: 02/12/2017 Discharge date: 02/18/2017  Admitted From: home Disposition:  home     Discharge Condition:  stable   CODE STATUS:  Full code   Consultations:  Hematology  nephrology    Discharge Diagnoses:  Active Problems:   TTP (thrombotic thrombocytopenic purpura) (HCC)   Sickle cell anemia (HCC)    Severe hypokalemia    Subjective: No complaints today.  Brief Summary: Dayna Claudine Mouton 27 year old with history of sickle cell trait admitted with right-sided paresthesia after flu-like illness. CT head and MRI were nondiagnostic. Found to have severe anemia and thrombocytopenia with schistocytes. Diagnosedwith TTP. Transferred to [facility] and plasmapheresis initiated.    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30 (HH) 0.22 (HH)    Discharge Exam: Vitals:   02/18/17 1317 02/18/17 1408  BP: (!) 172/95 (!) 147/80  Pulse: 88 80  Resp: (!) 22 20  Temp: 98.7 F (37.1 C) 98.2 F (36.8 C)  SpO2: 98% 98%   Vitals:   02/18/17 1303 02/18/17 1310 02/18/17 1317 02/18/17 1408  BP: (!) 169/89 (!) 182/89 (!) 172/95 (!) 147/80  Pulse: 84 91 88 80  Resp: (!) 26 (!) 23 (!) 22 20  Temp: 98.3 F (36.8 C)  98.2 F (36.8 C) 98.7 F (37.1 C) 98.2 F (36.8 C)  TempSrc: Oral Oral Oral Oral  SpO2:   98% 98%  Weight:      Height:        General: Pt is alert, awake, not in acute distress Cardiovascular: RRR, S1/S2 +, no rubs, no gallops Respiratory: CTA bilaterally, no wheezing, no rhonchi Abdominal: Soft, NT, ND, bowel sounds + Extremities: no edema, no cyanosis   Discharge Instructions  Discharge Instructions    Diet - low sodium heart healthy   Complete by:  As directed    Increase activity slowly   Complete by:  As directed      Allergies as of 02/18/2017   No Known Allergies     Medication List    STOP taking these medications   ciprofloxacin 500 MG tablet Commonly known as:  CIPRO     TAKE these medications   folic acid 1 MG tablet Commonly known as:  FOLVITE Take 1 tablet (1 mg total) by mouth daily. Start taking on:  02/19/2017   multivitamin with minerals tablet Take 1 tablet by mouth daily.   predniSONE 10 MG tablet Commonly known as:  DELTASONE Take 5 tablets (50 mg total) by mouth daily with breakfast. Start taking on:  02/19/2017       No Known Allergies   Procedures/Studies: Dialysis catheter  Dg Chest 2 View  Result Date: 02/12/2017 CLINICAL DATA:  Shortness of Breath EXAM: CHEST  2 VIEW COMPARISON:  None. FINDINGS: Lungs are clear. Heart size  and pulmonary vascularity are normal. No adenopathy. No bone lesions. IMPRESSION: No edema or consolidation. Electronically Signed   By: Bretta BangWilliam  Woodruff III M.D.   On: 02/12/2017 14:23   Ct Head Wo Contrast  Result Date: 02/12/2017 CLINICAL DATA:  Right-sided numbness. History of sickle cell disease EXAM: CT HEAD WITHOUT CONTRAST TECHNIQUE: Contiguous axial images were obtained from the base of the skull through the vertex without intravenous contrast. COMPARISON:  None. FINDINGS: Brain: The ventricles are normal in size and configuration. There is no intracranial mass, hemorrhage, extra-axial fluid  collection, or midline shift. Gray-white compartments are normal. No acute infarct evident. Vascular: No hyperdense vessels.  No vascular calcification evident. Skull: The bony calvarium appears intact. Sinuses/Orbits: Visualized paranasal sinuses are clear. Orbits appear symmetric bilaterally. Other: Mastoid air cells are clear. IMPRESSION: Study within normal limits. Electronically Signed   By: Bretta BangWilliam  Woodruff III M.D.   On: 02/12/2017 07:34   Ct Angio Chest Pe W And/or Wo Contrast  Result Date: 02/12/2017 CLINICAL DATA:  Shortness of breath with exertion. Elevated D-dimer. Evaluate for pulmonary embolism. EXAM: CT ANGIOGRAPHY CHEST WITH CONTRAST TECHNIQUE: Multidetector CT imaging of the chest was performed using the standard protocol during bolus administration of intravenous contrast. Multiplanar CT image reconstructions and MIPs were obtained to evaluate the vascular anatomy. CONTRAST:  100mL ISOVUE-370 IOPAMIDOL (ISOVUE-370) INJECTION 76% COMPARISON:  Chest x-ray from same day. FINDINGS: Cardiovascular: Suboptimal opacification of the pulmonary arteries due to bolus timing and patient's body habitus. No central or lobar pulmonary embolism. Normal heart size. No pericardial effusion. Normal caliber thoracic aorta. Mediastinum/Nodes: No enlarged mediastinal, hilar, or axillary lymph nodes. Thyroid gland, trachea, and esophagus demonstrate no significant findings. Lungs/Pleura: Lungs are clear. No pleural effusion or pneumothorax. Upper Abdomen: No acute abnormality. Musculoskeletal: Bilateral gynecomastia. No acute or significant osseous findings. Review of the MIP images confirms the above findings. IMPRESSION: 1. No central or lobar pulmonary embolism. The segmental pulmonary arteries are not well evaluated due to bolus timing and patient's body habitus. 2. No acute intrathoracic process. Electronically Signed   By: Obie DredgeWilliam T Derry M.D.   On: 02/12/2017 16:06   Mr Brain Wo Contrast  Result Date:  02/12/2017 CLINICAL DATA:  Severe headache while at work last night. RIGHT-sided numbness. Ataxia, suspect stroke. History of sickle cell trait. EXAM: MRI HEAD WITHOUT CONTRAST TECHNIQUE: Axial and coronal diffusion weighted imaging, sagittal T1, axial T2 and axial T2 FLAIR obtained on a 1.5 tesla scanner. Patient was unable to tolerate further imaging. COMPARISON:  CT HEAD February 13, 2016 at 0709 hours. FINDINGS: Sequences are mildly motion degraded. BRAIN: No reduced diffusion to suggest acute ischemia, hyperacute demyelination or hypercellular tumor. No field inhomogeneity to suggest blood products though susceptibility weighted sequence not obtained. No intrinsic T1 shortening. He ventricles and sulci are normal for patient's age. No suspicious parenchymal signal, mass or mass effect. No abnormal extra-axial fluid collections. VASCULAR: Normal major vascular flow voids present at skull base. SKULL AND UPPER CERVICAL SPINE: Mild prevertebral soft tissue fullness. No abnormal sellar expansion. No suspicious calvarial bone marrow signal. Craniocervical junction maintained. SINUSES/ORBITS: Included paranasal sinuses are well aerated. Imaged mastoid air cells are well aerated. The included ocular globes and orbital contents are non-suspicious. OTHER: None. IMPRESSION: 1. Limited motion degraded MRI, patient was unable to tolerate complete examination. 2. Negative noncontrast MRI of the head. 3. Prevertebral fullness, seen with recent viral illness or immunocompromised states. Electronically Signed   By: Awilda Metroourtnay  Bloomer M.D.   On: 02/12/2017 13:49  Dg Chest Port 1 View  Result Date: 02/12/2017 CLINICAL DATA:  Catheter placement. EXAM: PORTABLE CHEST 1 VIEW COMPARISON:  Earlier today FINDINGS: Interval placement of right IJ catheter with tip in the projection of the distal SVC. No pneumothorax visualized. The heart size and mediastinal contours are within normal limits. Both lungs are clear. The visualized  skeletal structures are unremarkable. IMPRESSION: No active disease. Electronically Signed   By: Signa Kell M.D.   On: 02/12/2017 20:37     The results of significant diagnostics from this hospitalization (including imaging, microbiology, ancillary and laboratory) are listed below for reference.     Microbiology: Recent Results (from the past 240 hour(s))  Respiratory Panel by PCR     Status: None   Collection Time: 02/12/17  3:40 PM  Result Value Ref Range Status   Adenovirus NOT DETECTED NOT DETECTED Final   Coronavirus 229E NOT DETECTED NOT DETECTED Final   Coronavirus HKU1 NOT DETECTED NOT DETECTED Final   Coronavirus NL63 NOT DETECTED NOT DETECTED Final   Coronavirus OC43 NOT DETECTED NOT DETECTED Final   Metapneumovirus NOT DETECTED NOT DETECTED Final   Rhinovirus / Enterovirus NOT DETECTED NOT DETECTED Final   Influenza A NOT DETECTED NOT DETECTED Final   Influenza B NOT DETECTED NOT DETECTED Final   Parainfluenza Virus 1 NOT DETECTED NOT DETECTED Final   Parainfluenza Virus 2 NOT DETECTED NOT DETECTED Final   Parainfluenza Virus 3 NOT DETECTED NOT DETECTED Final   Parainfluenza Virus 4 NOT DETECTED NOT DETECTED Final   Respiratory Syncytial Virus NOT DETECTED NOT DETECTED Final   Bordetella pertussis NOT DETECTED NOT DETECTED Final   Chlamydophila pneumoniae NOT DETECTED NOT DETECTED Final   Mycoplasma pneumoniae NOT DETECTED NOT DETECTED Final    Comment: Performed at [facility], [address] [place], [place] [address]  Surgical pcr screen     Status: None   Collection Time: 02/12/17  6:30 PM  Result Value Ref Range Status   MRSA, PCR NEGATIVE NEGATIVE Final   Staphylococcus aureus NEGATIVE NEGATIVE Final    Comment: (NOTE) The Xpert SA Assay (FDA approved for NASAL specimens in patients 63 years of age and older), is one component of a comprehensive surveillance program. It is not intended to diagnose infection nor to guide or monitor  treatment. Performed at [facility], [address] [place], [place] [address]   Culture, Urine     Status: Abnormal   Collection Time: 02/13/17 12:09 AM  Result Value Ref Range Status   Specimen Description URINE, CLEAN CATCH  Final   Special Requests NONE  Final   Culture MULTIPLE SPECIES PRESENT, SUGGEST RECOLLECTION (A)  Final   Report Status 02/14/2017 FINAL  Final  Blood culture (routine x 2)     Status: None   Collection Time: 02/13/17 12:22 AM  Result Value Ref Range Status   Specimen Description BLOOD LEFT HAND  Final   Special Requests IN PEDIATRIC BOTTLE Blood Culture adequate volume  Final   Culture NO GROWTH 5 DAYS  Final   Report Status 02/18/2017 FINAL  Final     Labs: BNP (last 3 results) No results for input(s): BNP in the last 8760 hours. Basic Metabolic Panel: Recent Labs  Lab 02/13/17 0410  02/14/17 0427  02/17/17 0852 02/17/17 1608 02/17/17 1958 02/18/17 0914 02/18/17 1104  NA 139   < > 140   < > 141 140 141 137 140  K 3.6   < > 4.0   < >  2.5* 3.5 5.2* 2.7* 2.8*  CL 103   < > 104   < > 101 96* 104 100* 95*  CO2 24  --  26  --  30  --  24 27  --   GLUCOSE 148*   < > 148*   < > 108* 140* 89 129* 115*  BUN 12   < > 17   < > 11 11 12 10 9   CREATININE 1.07   < > 1.05   < > 1.08 1.00 1.13 1.11 0.90  CALCIUM 8.6*  --  8.9  --  8.1*  --  8.2* 7.9*  --   MG 1.6*  --  2.0  --  1.8  --   --   --   --   PHOS  --   --  4.2  --   --   --   --   --   --    < > = values in this interval not displayed.   Liver Function Tests: Recent Labs  Lab 02/12/17 1438 02/17/17 0852  AST 61* 38  ALT 64* 106*  ALKPHOS 78 62  BILITOT 2.7* 0.9  PROT 7.0 5.2*  ALBUMIN 3.7 3.1*   No results for input(s): LIPASE, AMYLASE in the last 168 hours. No results for input(s): AMMONIA in the last 168 hours. CBC: Recent Labs  Lab 02/14/17 0427 02/15/17 0434  02/16/17 1417 02/17/17 0852 02/17/17 1608 02/18/17 0914 02/18/17 1104  WBC 34.9* 32.6*  --  26.8* 11.3*  --   11.3*  --   NEUTROABS 28.7* 27.1*  --  20.6* 7.0  --  5.7  --   HGB 8.0* 7.3*   < > 8.6*  10.2* 9.2* 9.2* 9.5* 9.2*  HCT 23.6* 21.7*   < > 26.4*  30.0* 29.3* 27.0* 29.3* 27.0*  MCV 95.9 98.6  --  100.0 102.1*  --  101.7*  --   PLT 74* 152  --  299 322  --  334  --    < > = values in this interval not displayed.   Cardiac Enzymes: Recent Labs  Lab 02/12/17 2110 02/13/17 0410 02/13/17 1115  TROPONINI 0.38* 0.30* 0.22*   BNP: Invalid input(s): POCBNP CBG: No results for input(s): GLUCAP in the last 168 hours. D-Dimer No results for input(s): DDIMER in the last 72 hours. Hgb A1c No results for input(s): HGBA1C in the last 72 hours. Lipid Profile No results for input(s): CHOL, HDL, LDLCALC, TRIG, CHOLHDL, LDLDIRECT in the last 72 hours. Thyroid function studies No results for input(s): TSH, T4TOTAL, T3FREE, THYROIDAB in the last 72 hours.  Invalid input(s): FREET3 Anemia work up Recent Labs    02/16/17 1417 02/17/17 0852  RETICCTPCT 19.8* 21.5*   Urinalysis    Component Value Date/Time   COLORURINE YELLOW 02/12/2017 2102   APPEARANCEUR CLEAR 02/12/2017 2102   LABSPEC 1.024 02/12/2017 2102   PHURINE 6.0 02/12/2017 2102   GLUCOSEU NEGATIVE 02/12/2017 2102   HGBUR MODERATE (A) 02/12/2017 2102   BILIRUBINUR NEGATIVE 02/12/2017 2102   KETONESUR NEGATIVE 02/12/2017 2102   PROTEINUR NEGATIVE 02/12/2017 2102   UROBILINOGEN 0.2 04/19/2013 1617   NITRITE NEGATIVE 02/12/2017 2102   LEUKOCYTESUR NEGATIVE 02/12/2017 2102   Sepsis Labs Invalid input(s): PROCALCITONIN,  WBC,  LACTICIDVEN Microbiology Recent Results (from the past 240 hour(s))  Respiratory Panel by PCR     Status: None   Collection Time: 02/12/17  3:40 PM  Result Value Ref Range Status   Adenovirus NOT  DETECTED NOT DETECTED Final   Coronavirus 229E NOT DETECTED NOT DETECTED Final   Coronavirus HKU1 NOT DETECTED NOT DETECTED Final   Coronavirus NL63 NOT DETECTED NOT DETECTED Final   Coronavirus OC43 NOT  DETECTED NOT DETECTED Final   Metapneumovirus NOT DETECTED NOT DETECTED Final   Rhinovirus / Enterovirus NOT DETECTED NOT DETECTED Final   Influenza A NOT DETECTED NOT DETECTED Final   Influenza B NOT DETECTED NOT DETECTED Final   Parainfluenza Virus 1 NOT DETECTED NOT DETECTED Final   Parainfluenza Virus 2 NOT DETECTED NOT DETECTED Final   Parainfluenza Virus 3 NOT DETECTED NOT DETECTED Final   Parainfluenza Virus 4 NOT DETECTED NOT DETECTED Final   Respiratory Syncytial Virus NOT DETECTED NOT DETECTED Final   Bordetella pertussis NOT DETECTED NOT DETECTED Final   Chlamydophila pneumoniae NOT DETECTED NOT DETECTED Final   Mycoplasma pneumoniae NOT DETECTED NOT DETECTED Final    Comment: Performed at [facility], [address] [place], [place] [address]  Surgical pcr screen     Status: None   Collection Time: 02/12/17  6:30 PM  Result Value Ref Range Status   MRSA, PCR NEGATIVE NEGATIVE Final   Staphylococcus aureus NEGATIVE NEGATIVE Final    Comment: (NOTE) The Xpert SA Assay (FDA approved for NASAL specimens in patients 43 years of age and older), is one component of a comprehensive surveillance program. It is not intended to diagnose infection nor to guide or monitor treatment. Performed at [facility], [address], [place], [place] [address]   Culture, Urine     Status: Abnormal   Collection Time: 02/13/17 12:09 AM  Result Value Ref Range Status   Specimen Description URINE, CLEAN CATCH  Final   Special Requests NONE  Final   Culture MULTIPLE SPECIES PRESENT, SUGGEST RECOLLECTION (A)  Final   Report Status 02/14/2017 FINAL  Final  Blood culture (routine x 2)     Status: None   Collection Time: 02/13/17 12:22 AM  Result Value Ref Range Status   Specimen Description BLOOD LEFT HAND  Final   Special Requests IN PEDIATRIC BOTTLE Blood Culture adequate volume  Final   Culture NO GROWTH 5 DAYS  Final   Report Status 02/18/2017 FINAL  Final     Time  coordinating discharge: Over 30 minutes  SIGNED:   Calvert Cantor, MD  [facility] 02/18/2017, 3:25 PM Pager   If 7PM-7AM, please contact night-coverage [email or web address] Password [email or web address]

## 2017-02-18 NOTE — Progress Notes (Signed)
Patient discharged to home with instructions. 

## 2017-02-18 NOTE — Progress Notes (Signed)
CRITICAL VALUE ALERT  Critical Value:  Potassium 2.7  Date & Time Notied:  02/18/17 @10 :30  Provider Notified: Dr. Butler Denmarkizwan  Orders Received/Actions taken: potassium meds ordered

## 2017-02-19 LAB — THERAPEUTIC PLASMA EXCHANGE (BLOOD BANK)
Plasma Exchange: 4360
Plasma volume needed: 4360
UNIT DIVISION: 0
UNIT DIVISION: 0
UNIT DIVISION: 0
UNIT DIVISION: 0
UNIT DIVISION: 0
UNIT DIVISION: 0
UNIT DIVISION: 0
UNIT DIVISION: 0
Unit division: 0
Unit division: 0
Unit division: 0
Unit division: 0
Unit division: 0
Unit division: 0
Unit division: 0
Unit division: 0
Unit division: 0

## 2017-02-22 ENCOUNTER — Telehealth: Payer: Self-pay

## 2017-02-22 ENCOUNTER — Telehealth: Payer: Self-pay | Admitting: Hematology

## 2017-02-22 NOTE — Telephone Encounter (Signed)
Hospital f/u appt has been scheduled for the pt to see Dr. Candise CheKale on 1/29 at 130pm. Pt aware to arrive 30 minutes early.

## 2017-02-22 NOTE — Telephone Encounter (Signed)
Pt called requesting hospital f/u appt Tuesday (1/29). In-basket sent to [facility] and spoke with new pt scheduler. Pt to expect a call. Home number in Epic corrected to [phone]. Pt aware.

## 2017-02-23 ENCOUNTER — Inpatient Hospital Stay: Payer: Managed Care, Other (non HMO)

## 2017-02-23 ENCOUNTER — Encounter: Payer: Self-pay | Admitting: Hematology

## 2017-02-23 ENCOUNTER — Inpatient Hospital Stay: Payer: Managed Care, Other (non HMO) | Attending: Hematology | Admitting: Hematology

## 2017-02-23 VITALS — BP 144/80 | HR 88 | Temp 99.1°F | Resp 20 | Ht 71.0 in | Wt 290.6 lb

## 2017-02-23 DIAGNOSIS — M3119 Other thrombotic microangiopathy: Secondary | ICD-10-CM

## 2017-02-23 DIAGNOSIS — K4090 Unilateral inguinal hernia, without obstruction or gangrene, not specified as recurrent: Secondary | ICD-10-CM

## 2017-02-23 DIAGNOSIS — D571 Sickle-cell disease without crisis: Secondary | ICD-10-CM | POA: Insufficient documentation

## 2017-02-23 DIAGNOSIS — D696 Thrombocytopenia, unspecified: Secondary | ICD-10-CM | POA: Diagnosis not present

## 2017-02-23 DIAGNOSIS — M311 Thrombotic microangiopathy: Secondary | ICD-10-CM

## 2017-02-23 DIAGNOSIS — D594 Other nonautoimmune hemolytic anemias: Secondary | ICD-10-CM | POA: Insufficient documentation

## 2017-02-23 DIAGNOSIS — R51 Headache: Secondary | ICD-10-CM | POA: Diagnosis not present

## 2017-02-23 DIAGNOSIS — R200 Anesthesia of skin: Secondary | ICD-10-CM | POA: Insufficient documentation

## 2017-02-23 DIAGNOSIS — D649 Anemia, unspecified: Secondary | ICD-10-CM | POA: Diagnosis not present

## 2017-02-23 DIAGNOSIS — F17210 Nicotine dependence, cigarettes, uncomplicated: Secondary | ICD-10-CM | POA: Diagnosis not present

## 2017-02-23 LAB — CMP (CANCER CENTER ONLY)
ALT: 77 U/L — ABNORMAL HIGH (ref 0–55)
ANION GAP: 9 (ref 3–11)
AST: 29 U/L (ref 5–34)
Albumin: 3.8 g/dL (ref 3.5–5.0)
Alkaline Phosphatase: 67 U/L (ref 40–150)
BUN: 14 mg/dL (ref 7–26)
CHLORIDE: 104 mmol/L (ref 98–109)
CO2: 27 mmol/L (ref 22–29)
Calcium: 9.2 mg/dL (ref 8.4–10.4)
Creatinine: 1.03 mg/dL (ref 0.70–1.30)
GFR, Estimated: >60 mL/min (ref >60)
Glucose, Bld: 121 mg/dL (ref 70–140)
POTASSIUM: 4.4 mmol/L (ref 3.5–5.1)
Sodium: 140 mmol/L (ref 136–145)
Total Bilirubin: 0.4 mg/dL (ref 0.2–1.2)
Total Protein: 6.8 g/dL (ref 6.4–8.3)

## 2017-02-23 LAB — CBC WITH DIFFERENTIAL (CANCER CENTER ONLY)
BASOS ABS: 0 10*3/uL (ref 0.0–0.1)
BASOS PCT: 0 %
EOS ABS: 0 10*3/uL (ref 0.0–0.5)
Eosinophils Relative: 0 %
HCT: 32.5 % — ABNORMAL LOW (ref 38.4–49.9)
HEMOGLOBIN: 10.5 g/dL — AB (ref 13.0–17.1)
Lymphocytes Relative: 19 %
Lymphs Abs: 2.5 10*3/uL (ref 0.9–3.3)
MCH: 33.2 pg (ref 27.2–33.4)
MCHC: 32.3 g/dL (ref 32.0–36.0)
MCV: 102.8 fL — ABNORMAL HIGH (ref 79.3–98.0)
Monocytes Absolute: 0.3 10*3/uL (ref 0.1–0.9)
Monocytes Relative: 2 %
NEUTROS PCT: 79 %
Neutro Abs: 10.5 10*3/uL — ABNORMAL HIGH (ref 1.5–6.5)
PLATELETS: 496 10*3/uL — AB (ref 140–400)
RBC: 3.16 MIL/uL — AB (ref 4.20–5.82)
RDW: 20.2 % — ABNORMAL HIGH (ref 11.0–15.6)
WBC: 13.4 10*3/uL — AB (ref 4.0–10.3)

## 2017-02-23 LAB — LACTATE DEHYDROGENASE: LDH: 237 U/L (ref 125–245)

## 2017-02-23 LAB — RETICULOCYTES
RBC.: 3.16 MIL/uL — AB (ref 4.20–5.82)
RETIC COUNT ABSOLUTE: 278.1 10*3/uL — AB (ref 34.8–93.9)
RETIC CT PCT: 8.8 % — AB (ref 0.8–1.8)

## 2017-02-23 MED ORDER — PREDNISONE 10 MG PO TABS: ORAL_TABLET | ORAL | 0 refills | Status: DC

## 2017-02-23 NOTE — Progress Notes (Signed)
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 male who has been referred to [place] by Fayrene Helper, PA-C in the Emergency Department for evaluation and management of new onset severe anemia and thrombocytopenia.  Patient has received Benadryl and is currently too sleepy to provide much information. Most of the information was obtained from the ED physician and confirmed with the fiance at bedside and key elements were confirmed with patient.  Patient fiancee noted that the patient had recurrent flu-like sx recently and he was treated with amoxacillin.   Prior to, the patient was evaluated at [facility] 2 weeks ago for flu-like symptoms and he tested negative for flu and strep and was prescribed Theraflu. [name] notes that since then the patient symptoms progressively worsened. Patient was evaluated in the ED with a HA on yesterday and his CT Head showed no abnormalities. Per the Provider in the ED, patient was noted to have an abnormal gait favoring his left leg along with mild decreased sensation and minimal pronator drift. [name] also notes that the patient had progressive SOB x [redacted] week along with right sided numbness that she noticed last night along with slurred speech.   in the ED he was noted to have significant anemia with hemoglobin of 5.8 and significant thrombocytopenia with a platelet count of 16k.  Discussed with patient's fiancee regarding the patient's labwork today (02/12/17) of it is as follows: CMP with Cr at 1.09, AST at 61, ALT at 64, and total bilirubin at 2.7. I-stat-Chem 8 with hgb at 5.4, HCT at 16.0. CBC with WBC at 22, RBC at 1.8, and PLT at 16k. LDH at 1,378 today. Vitamin B12, folate, Iron studies, and ferritin,  haptoglobin, AdamTS13 , HIV tests pending today.   [name] denies the patient having any other PMHx, surgeries, or allergies to medications. She also denies the patient obtaining recent vaccinations. [name] notes that the patient has an inguinal hernia that is reduced at this time. She denies the patient ever obtaining a blood transfusion or having a prior hx of blood disorders.   She notes that the patient currently smokes about 0.5 PPD or cigarettes and that he has previously used marijuana twice within the last year. She denies any other illegal drug use. She denies that patient having a PCP as he is typically healthy and he doesn't get sick. 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    On review of systems, fiancee reports that the patient had progressive SOB.  Notes some diarrhea, blood on the tissue upon wiping, and dark brown urine. He denies abdominal pain, dysuria, nasal bleeding, gum bleeding, bruising, flank pain, known exposure to HIV and any other symptoms.     INTERVAL HISTORY   Richard [name] is here for a follow up and to review his further workup. He was last seen by me in the hospital. He presents to the clinic today accompanied by significant other.   He notes he is doing much better since hospital stay. He is still taking B12, folic acid and prednisone. He discussed his heavy work load and busy schedule. He wants to know how he can handle this with his diagnosis. He is not able to return to work without doctor clearance and guidelines given.  No issues with bleeding or abnormal bruising. Labs show stable platelets and improving hgb.   On review of symptoms, pt notes to being mildly SOB when walking up steps, he is sleeping well. He denies change in urine output.     MEDICAL HISTORY:  Past Medical History:  Diagnosis Date  . Sickle cell trait (HCC)     SURGICAL HISTORY: History reviewed. No pertinent surgical history.  SOCIAL HISTORY: Social History    Socioeconomic History  . Marital status: Single    Spouse name: Not on file  . Number of children: Not on file  . Years of education: Not on file  . Highest education level: Not on file  Social Needs  . Financial resource strain: Not on file  . Food insecurity - worry: Not on file  . Food insecurity - inability: Not on file  . Transportation needs - medical: Not on file  . Transportation needs - non-medical: Not on file  Occupational History  . Not on file  Tobacco Use  . Smoking status: Former Smoker    Packs/day: 0.50    Types: Cigarettes    Last attempt to quit: 01/2017    Years since quitting: 0.0  . Smokeless tobacco: Never Used  Substance and Sexual Activity  . Alcohol use: No    Frequency: Never  . Drug use: Yes    Types: Marijuana  . Sexual activity: Not on file  Other Topics Concern  . Not on file  Social History Narrative  . Not on file    FAMILY HISTORY: Family History  Problem Relation Age of Onset  . Cancer Other   . Diabetes Other   . Hypertension Other     ALLERGIES:  has No Known Allergies.  MEDICATIONS:  Current Outpatient Medications  Medication Sig Dispense Refill  . folic acid (FOLVITE) 1 MG tablet Take 1 tablet (1 mg total) by mouth daily. 30 tablet 0  . predniSONE (DELTASONE) 10 MG tablet 40mg  po daily x 5 days then 30mg  po daily x 5 days then 20mg  po daily till further taper instructions 60 tablet 0  . vitamin B-12 (CYANOCOBALAMIN) 1000 MCG tablet Take 1 tablet (1,000 mcg total) by mouth daily. 30 tablet 0  . Multiple Vitamins-Minerals (MULTIVITAMIN WITH MINERALS) tablet Take 1 tablet by mouth daily. (Patient not taking: Reported on 02/23/2017) 120 tablet 2   No current facility-administered medications for this visit.     REVIEW OF SYSTEMS:    10 Point review of Systems was done is negative except as noted above.  PHYSICAL EXAMINATION: ECOG PERFORMANCE STATUS: 0 - Asymptomatic  . Vitals:   02/23/17 1446  BP: (!) 144/80   Pulse: 88  Resp: 20  Temp: 99.1 F (37.3 C)  SpO2: 100%   Filed Weights   02/23/17 1446  Weight: 290 lb 9.6 oz (131.8 kg)   .Body mass index is 40.53 kg/m.  GENERAL:alert, in no acute distress and comfortable SKIN: no acute rashes, no significant lesions EYES: conjunctiva are pink and non-injected, sclera anicteric OROPHARYNX: MMM, no exudates, no oropharyngeal erythema or ulceration NECK: supple, no JVD LYMPH:  no palpable lymphadenopathy in the cervical, axillary or inguinal regions LUNGS: clear to auscultation b/l with normal respiratory effort HEART: regular rate & rhythm ABDOMEN:  normoactive bowel sounds , non tender, not distended. Extremity: no pedal edema PSYCH: alert & oriented x 3 with fluent speech NEURO: no focal motor/sensory deficits  LABORATORY DATA:  I have reviewed the data as listed  .  CBC Latest Ref Rng & Units 02/23/2017 02/18/2017 02/18/2017  WBC 4.0 - 10.3 K/uL 13.4(H) - 11.3(H)  Hemoglobin 13.0 - 17.0 g/dL - 9.2(L) 9.5(L)  Hematocrit 38.4 - 49.9 % 32.5(L) 27.0(L) 29.3(L)  Platelets 140 - 400 K/uL 496(H) - 334    . CMP Latest Ref Rng & Units 02/23/2017 02/18/2017 02/18/2017  Glucose 70 - 140 mg/dL 161121 096(E165(H) 454(U115(H)  BUN 7 - 26 mg/dL 14 9 9   Creatinine 0.61 - 1.24 mg/dL - 9.811.12 1.910.90  Sodium 478136 - 145 mmol/L 140 137 140  Potassium 3.5 - 5.1 mmol/L 4.4 3.6 2.8(L)  Chloride 98 - 109 mmol/L 104 99(L) 95(L)  CO2 22 - 29 mmol/L 27 26 -  Calcium 8.4 - 10.4 mg/dL 9.2 8.1(L) -  Total Protein 6.4 - 8.3 g/dL 6.8 - -  Total Bilirubin 0.2 - 1.2 mg/dL 0.4 - -  Alkaline Phos 40 - 150 U/L 67 - -  AST 5 - 34 U/L 29 - -  ALT 0 - 55 U/L 77(H) - -   Component     Latest Ref Rng & Units 02/23/2017  WBC     4.0 - 10.3 K/uL 13.4 (H)  RBC     4.20 - 5.82 MIL/uL 3.16 (L)  Hemoglobin     13.0 - 17.1 g/dL 29.510.5 (L)  HCT     62.138.4 - 49.9 % 32.5 (L)  MCV     79.3 - 98.0 fL 102.8 (H)  MCH     27.2 - 33.4 pg 33.2  MCHC     32.0 - 36.0 g/dL 30.832.3  RDW     65.711.0 -  84.614.6 % 20.2 (H)  Platelets     140 - 400 K/uL 496 (H)  Smear Review        Neutrophils     % 79  NEUT#     1.5 - 6.5 K/uL 10.5 (H)  Lymphocytes     % 19  Lymphocyte #     0.9 - 3.3 K/uL 2.5  Monocytes Relative     % 2  Monocyte #     0.1 - 0.9 K/uL 0.3  Eosinophil     % 0  Eosinophils Absolute     0.0 - 0.5 K/uL 0.0  Basophil     % 0  Basophils Absolute     0.0 - 0.1 K/uL 0.0  Retic Ct Pct     0.8 - 1.8 % 8.8 (H)  RBC.     4.20 - 5.82 MIL/uL 3.16 (L)  Retic Count, Absolute     34.8 - 93.9 K/uL 278.1 (H)  LDH     125 - 245 U/L 237    RADIOGRAPHIC STUDIES: I have personally reviewed the radiological images as listed and agreed with the findings in the report. Dg Chest 2 View  Result Date: 02/12/2017 CLINICAL DATA:  Shortness of Breath EXAM: CHEST  2 VIEW COMPARISON:  None. FINDINGS: Lungs are clear. Heart size and pulmonary vascularity are normal. No adenopathy. No bone lesions. IMPRESSION: No edema or consolidation. Electronically Signed   By: Bretta BangWilliam  Woodruff III M.D.   On: 02/12/2017 14:23   Ct Head Wo Contrast  Result Date: 02/12/2017 CLINICAL DATA:  Right-sided numbness. History of sickle cell disease EXAM: CT HEAD WITHOUT CONTRAST TECHNIQUE: Contiguous axial images were obtained from the base of the skull through the vertex without intravenous contrast. COMPARISON:  None. FINDINGS: Brain: The ventricles are normal in size and configuration. There is no intracranial mass,  hemorrhage, extra-axial fluid collection, or midline shift. Gray-white compartments are normal. No acute infarct evident. Vascular: No hyperdense vessels.  No vascular calcification evident. Skull: The bony calvarium appears intact. Sinuses/Orbits: Visualized paranasal sinuses are clear. Orbits appear symmetric bilaterally. Other: Mastoid air cells are clear. IMPRESSION: Study within normal limits. Electronically Signed   By: Bretta Bang III M.D.   On: 02/12/2017 07:34   Ct Angio Chest Pe W  And/or Wo Contrast  Result Date: 02/12/2017 CLINICAL DATA:  Shortness of breath with exertion. Elevated D-dimer. Evaluate for pulmonary embolism. EXAM: CT ANGIOGRAPHY CHEST WITH CONTRAST TECHNIQUE: Multidetector CT imaging of the chest was performed using the standard protocol during bolus administration of intravenous contrast. Multiplanar CT image reconstructions and MIPs were obtained to evaluate the vascular anatomy. CONTRAST:  ISOVUE-370 IOPAMIDOL (ISOVUE-370) INJECTION 76% COMPARISON:  Chest x-ray from same day. FINDINGS: Cardiovascular: Suboptimal opacification of the pulmonary arteries due to bolus timing and patient's body habitus. No central or lobar pulmonary embolism. Normal heart size. No pericardial effusion. Normal caliber thoracic aorta. Mediastinum/Nodes: No enlarged mediastinal, hilar, or axillary lymph nodes. Thyroid gland, trachea, and esophagus demonstrate no significant findings. Lungs/Pleura: Lungs are clear. No pleural effusion or pneumothorax. Upper Abdomen: No acute abnormality. Musculoskeletal: Bilateral gynecomastia. No acute or significant osseous findings. Review of the MIP images confirms the above findings. IMPRESSION: 1. No central or lobar pulmonary embolism. The segmental pulmonary arteries are not well evaluated due to bolus timing and patient's body habitus. 2. No acute intrathoracic process. Electronically Signed   By: Obie Dredge M.D.   On: 02/12/2017 16:06   Mr Brain Wo Contrast  Result Date: 02/12/2017 CLINICAL DATA:  Severe headache while at work last night. RIGHT-sided numbness. Ataxia, suspect stroke. History of sickle cell trait. EXAM: MRI HEAD WITHOUT CONTRAST TECHNIQUE: Axial and coronal diffusion weighted imaging, sagittal T1, axial T2 and axial T2 FLAIR obtained on a 1.5 tesla scanner. Patient was unable to tolerate further imaging. COMPARISON:  CT HEAD February 13, 2016 at 0709 hours. FINDINGS: Sequences are mildly motion degraded. BRAIN: No reduced  diffusion to suggest acute ischemia, hyperacute demyelination or hypercellular tumor. No field inhomogeneity to suggest blood products though susceptibility weighted sequence not obtained. No intrinsic T1 shortening. He ventricles and sulci are normal for patient's age. No suspicious parenchymal signal, mass or mass effect. No abnormal extra-axial fluid collections. VASCULAR: Normal major vascular flow voids present at skull base. SKULL AND UPPER CERVICAL SPINE: Mild prevertebral soft tissue fullness. No abnormal sellar expansion. No suspicious calvarial bone marrow signal. Craniocervical junction maintained. SINUSES/ORBITS: Included paranasal sinuses are well aerated. Imaged mastoid air cells are well aerated. The included ocular globes and orbital contents are non-suspicious. OTHER: None. IMPRESSION: 1. Limited motion degraded MRI, patient was unable to tolerate complete examination. 2. Negative noncontrast MRI of the head. 3. Prevertebral fullness, seen with recent viral illness or immunocompromised states. Electronically Signed   By: Awilda Metro M.D.   On: 02/12/2017 13:49   Dg Chest Port 1 View  Result Date: 02/12/2017 CLINICAL DATA:  Catheter placement. EXAM: PORTABLE CHEST 1 VIEW COMPARISON:  Earlier today FINDINGS: Interval placement of right IJ catheter with tip in the projection of the distal SVC. No pneumothorax visualized. The heart size and mediastinal contours are within normal limits. Both lungs are clear. The visualized skeletal structures are unremarkable. IMPRESSION: No active disease. Electronically Signed   By: Signa Kell M.D.   On: 02/12/2017 20:37    ASSESSMENT & PLAN:   27 y.o.  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 On presentation hemoglobin is down to 5.8 with elevated LDH of 1300s. LDH improved to 200's. hgb up to 10.5 with Robust  reticulocytosis. -HIV test -non reactive Last plasma exchange done on 02/19/2016  2. 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 Platelets were 16k on initial presentation. No resolved with platelets to 496k  With no overt bleeding noted at this time.  3. Left sided mild weakness/numbness - resolved No overt CVA at this time an MRI.  4. Elevated ferritin on hospital admission - due to overt hemolysis.   Plan  -No clinical or evidence of relapse TTP at this time -if concern/need for ongoing outpatient plasma-ex will then decide to have a tunneled catheter placed. -if he does relapse or his ADAM TS 13 level < 10% with high risk of relapse - will need to consider Rituxan. - PRBC transfusion prn to maintain Hgb >7 or if symptomatic (has not needed this since plasma exchange has be initiated) -He will continue on folic acid 2 mg, B12 1000 mcg daily  -continue Prednisone with taper dosage: 40 mg po daily x5 days then 30mg  po daily x5 days then 20mg  po daily till further taper instructions.  -Patient counseled about concerning clinical symptoms and signs to watch for and counseled to return to ED stat if any of these concerns arise. -avoid NSAIDS. -Will repeat labs twice a week then once a week when counts have better stabilized as we need to closely monitor him.  -I discussed ideally it is recommended he not do heavy lifting or opporate heavy machinery for the next month post remission as this is the highest risk of recurrence. Pt will obtain paperwork for clearance to work.  -ADAMTS13 levels will be reordered for next labs since it was not sent out in the hospital despite orders.   5. URI symptoms  -Viral respiratory panel - neg. Symptoms resolved    Labs on Friday  RTC with Labs with Dr. Candise Che on 03/01/17    All of the patients questions were answered with apparent satisfaction. The patient knows to call the clinic with any problems, questions or concerns.  I spent 25 minutes  counseling the patient face to face. The total time spent in the appointment was 30 minutes and more than 50% was on counseling and direct patient cares.    Wyvonnia Lora MD MS AAHIVMS [facility] Hematology/Oncology Physician [facility]  (Office):       [phone] (Work cell):  [phone] (Fax):           [phone]  02/23/2017 3:52 PM   This document serves as a record of services personally performed by Wyvonnia Lora, MD. It was created on his behalf by Delphina Cahill, a trained medical scribe. The creation of this record is based on the scribe's personal observations and the provider's statements to them.    .I have reviewed the above documentation for accuracy and completeness, and I agree with the above. Johney Maine MD MS

## 2017-02-25 ENCOUNTER — Telehealth: Payer: Self-pay

## 2017-02-25 ENCOUNTER — Other Ambulatory Visit: Payer: Self-pay

## 2017-02-25 DIAGNOSIS — M3119 Other thrombotic microangiopathy: Secondary | ICD-10-CM

## 2017-02-25 DIAGNOSIS — M311 Thrombotic microangiopathy: Secondary | ICD-10-CM

## 2017-02-25 NOTE — Telephone Encounter (Signed)
Scheduling message sent to have lab appt added prior to visit with Dr. Candise CheKale on 03/11/17.

## 2017-02-25 NOTE — Telephone Encounter (Signed)
Addendum to previous message: lab appt requested prior to appt with Dr. Candise CheKale on 03/01/17.

## 2017-02-26 ENCOUNTER — Inpatient Hospital Stay: Payer: Managed Care, Other (non HMO) | Attending: Hematology

## 2017-02-26 DIAGNOSIS — R531 Weakness: Secondary | ICD-10-CM | POA: Insufficient documentation

## 2017-02-26 DIAGNOSIS — M311 Thrombotic microangiopathy: Secondary | ICD-10-CM | POA: Insufficient documentation

## 2017-02-26 DIAGNOSIS — R0602 Shortness of breath: Secondary | ICD-10-CM | POA: Diagnosis not present

## 2017-02-26 DIAGNOSIS — M3119 Other thrombotic microangiopathy: Secondary | ICD-10-CM

## 2017-02-26 DIAGNOSIS — D573 Sickle-cell trait: Secondary | ICD-10-CM | POA: Diagnosis not present

## 2017-02-26 DIAGNOSIS — R200 Anesthesia of skin: Secondary | ICD-10-CM | POA: Insufficient documentation

## 2017-02-26 DIAGNOSIS — Z87891 Personal history of nicotine dependence: Secondary | ICD-10-CM | POA: Insufficient documentation

## 2017-02-26 DIAGNOSIS — Z79899 Other long term (current) drug therapy: Secondary | ICD-10-CM | POA: Diagnosis not present

## 2017-02-26 DIAGNOSIS — Z7952 Long term (current) use of systemic steroids: Secondary | ICD-10-CM | POA: Diagnosis not present

## 2017-02-26 DIAGNOSIS — D594 Other nonautoimmune hemolytic anemias: Secondary | ICD-10-CM

## 2017-02-26 DIAGNOSIS — D696 Thrombocytopenia, unspecified: Secondary | ICD-10-CM | POA: Diagnosis not present

## 2017-02-26 LAB — CBC WITH DIFFERENTIAL (CANCER CENTER ONLY)
Basophils Absolute: 0 10*3/uL (ref 0.0–0.1)
Basophils Relative: 0 %
EOS PCT: 1 %
Eosinophils Absolute: 0.1 10*3/uL (ref 0.0–0.5)
HEMATOCRIT: 34.3 % — AB (ref 38.4–49.9)
Hemoglobin: 11.1 g/dL — ABNORMAL LOW (ref 13.0–17.1)
LYMPHS ABS: 4.9 10*3/uL — AB (ref 0.9–3.3)
LYMPHS PCT: 32 %
MCH: 33.2 pg (ref 27.2–33.4)
MCHC: 32.4 g/dL (ref 32.0–36.0)
MCV: 102.7 fL — AB (ref 79.3–98.0)
MONO ABS: 1.6 10*3/uL — AB (ref 0.1–0.9)
Monocytes Relative: 11 %
NEUTROS ABS: 8.5 10*3/uL — AB (ref 1.5–6.5)
Neutrophils Relative %: 56 %
PLATELETS: 451 10*3/uL — AB (ref 140–400)
RBC: 3.34 MIL/uL — AB (ref 4.20–5.82)
RDW: 18.8 % — AB (ref 11.0–14.6)
WBC Count: 15.1 10*3/uL — ABNORMAL HIGH (ref 4.0–10.3)

## 2017-02-26 LAB — CMP (CANCER CENTER ONLY)
ALBUMIN: 3.6 g/dL (ref 3.5–5.0)
ALT: 45 U/L (ref 0–55)
AST: 14 U/L (ref 5–34)
Alkaline Phosphatase: 61 U/L (ref 40–150)
Anion gap: 10 (ref 3–11)
BUN: 11 mg/dL (ref 7–26)
CHLORIDE: 103 mmol/L (ref 98–109)
CO2: 27 mmol/L (ref 22–29)
CREATININE: 1.08 mg/dL (ref 0.70–1.30)
Calcium: 8.9 mg/dL (ref 8.4–10.4)
GFR, Est AFR Am: >60 mL/min (ref >60)
GFR, Estimated: >60 mL/min (ref >60)
GLUCOSE: 99 mg/dL (ref 70–140)
POTASSIUM: 4.2 mmol/L (ref 3.5–5.1)
SODIUM: 140 mmol/L (ref 136–145)
Total Bilirubin: 0.3 mg/dL (ref 0.2–1.2)
Total Protein: 6.4 g/dL (ref 6.4–8.3)

## 2017-02-26 LAB — RETICULOCYTES
RBC.: 3.32 MIL/uL — AB (ref 4.20–5.82)
RETIC COUNT ABSOLUTE: 295.5 10*3/uL — AB (ref 34.8–93.9)
Retic Ct Pct: 8.9 % — ABNORMAL HIGH (ref 0.8–1.8)

## 2017-02-26 LAB — LACTATE DEHYDROGENASE: LDH: 189 U/L (ref 125–245)

## 2017-02-26 NOTE — Progress Notes (Signed)
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 male who has been referred to [place] by Richard Helper, PA-C in the Emergency Department for evaluation and management of new onset severe anemia and thrombocytopenia.  Patient has received Benadryl and is currently too sleepy to provide much information. Most of the information was obtained from the ED physician and confirmed with the fiance at bedside and key elements were confirmed with patient.  Patient fiancee noted that the patient had recurrent flu-like sx recently and he was treated with amoxacillin.   Prior to, the patient was evaluated at [facility] 2 weeks ago for flu-like symptoms and he tested negative for flu and strep and was prescribed Theraflu. Richard [name] notes that since then the patient symptoms progressively worsened. Patient was evaluated in the ED with a HA on yesterday and his CT Head showed no abnormalities. Per the Provider in the ED, patient was noted to have an abnormal gait favoring his left leg along with mild decreased sensation and minimal pronator drift. [name] also notes that the patient had progressive SOB x [redacted] week along with right sided numbness that she noticed last night along with slurred speech.   in the ED he was noted to have significant anemia with hemoglobin of 5.8 and significant thrombocytopenia with a platelet count of 16k.  Discussed with patient's fiancee regarding the patient's labwork today (02/12/17) of it is as follows: CMP with Cr at 1.09, AST at 61, ALT at 64, and total bilirubin at 2.7. I-stat-Chem 8 with hgb at 5.4, HCT at 16.0. CBC with WBC at 22, RBC at 1.8, and PLT at 16k. LDH at 1,378 today. Vitamin B12, folate, Iron studies, and ferritin,  haptoglobin, AdamTS13 , HIV tests pending today.   Richard [name] denies the patient having any other PMHx, surgeries, or allergies to medications. She also denies the patient obtaining recent vaccinations. Richard [name] notes that the patient has an inguinal hernia that is reduced at this time. She denies the patient ever obtaining a blood transfusion or having a prior hx of blood disorders.   She notes that the patient currently smokes about 0.5 PPD or cigarettes and that he has previously used marijuana twice within the last year. She denies any other illegal drug use. She denies that patient having a PCP as he is typically healthy and he doesn't get sick. 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    On review of systems, fiancee reports that the patient had progressive SOB.  Notes some diarrhea, blood on the tissue upon wiping, and dark brown urine. He denies abdominal pain, dysuria, nasal bleeding, gum bleeding, bruising, flank pain, known exposure to HIV and any other symptoms.     INTERVAL HISTORY   Richard [name] is here for a follow up for his TTP. He notes that he continues to feel better. Improving hgb and normal platelets.  No other acute new concerns. Return to work forms filled per his request. No issues with bleeding or abnormal bruising. Labs show stable platelets and improving hgb.   On review of symptoms, pt notes to being mildly SOB when walking up steps, he is sleeping well. He denies change in urine output.     MEDICAL HISTORY:  Past Medical History:  Diagnosis Date   Sickle cell trait (HCC)     SURGICAL HISTORY: History reviewed. No pertinent surgical history.  SOCIAL HISTORY: Social History   Socioeconomic History   Marital status: Single    Spouse name: Not on file   Number of children: Not on file   Years of education: Not on file   Highest education level: Not on file  Social Needs   Financial resource strain: Not on file   Food insecurity -  worry: Not on file   Food insecurity - inability: Not on file   Transportation needs - medical: Not on file   Transportation needs - non-medical: Not on file  Occupational History   Not on file  Tobacco Use   Smoking status: Former Smoker    Packs/day: 0.50    Types: Cigarettes    Last attempt to quit: 01/2017    Years since quitting: 0.0   Smokeless tobacco: Never Used  Substance and Sexual Activity   Alcohol use: No    Frequency: Never   Drug use: Yes    Types: Marijuana   Sexual activity: Not on file  Other Topics Concern   Not on file  Social History Narrative   Not on file    FAMILY HISTORY: Family History  Problem Relation Age of Onset   Cancer Other    Diabetes Other    Hypertension Other     ALLERGIES:  has No Known Allergies.  MEDICATIONS:  Current Outpatient Medications  Medication Sig Dispense Refill   folic acid (FOLVITE) 1 MG tablet Take 1 tablet (1 mg total) by mouth daily. 30 tablet 0   Multiple Vitamins-Minerals (MULTIVITAMIN WITH MINERALS) tablet Take 1 tablet by mouth daily. 120 tablet 2   predniSONE (DELTASONE) 10 MG tablet 40mg  po daily x 5 days then 30mg  po daily x 5 days then 20mg  po daily till further taper instructions 60 tablet 0   vitamin B-12 (CYANOCOBALAMIN) 1000 MCG tablet Take 1 tablet (1,000 mcg total) by mouth daily. 30 tablet 0   No current facility-administered medications for this visit.     REVIEW OF SYSTEMS:    10 Point review of Systems was done is negative except as noted above.  PHYSICAL EXAMINATION: ECOG PERFORMANCE STATUS: 0 - Asymptomatic  . Vitals:   03/01/17 1225  BP: 137/87  Pulse: 93  Resp: 20  Temp: 98.7 F (37.1 C)  SpO2: 98%   Filed Weights   03/01/17 1225  Weight: 290 lb 8 oz (131.8 kg)   .Body mass index is 40.52 kg/m.  GENERAL:alert, in no acute distress and comfortable SKIN: no acute rashes, no significant lesions EYES: conjunctiva are pink and non-injected, sclera  anicteric OROPHARYNX: MMM, no exudates, no oropharyngeal erythema or ulceration NECK: supple, no JVD LYMPH:  no palpable lymphadenopathy in the cervical, axillary or inguinal regions LUNGS: clear to auscultation b/l with normal respiratory effort HEART: regular rate & rhythm ABDOMEN:  normoactive bowel sounds , non tender, not distended. Extremity: no pedal edema PSYCH: alert & oriented x 3 with fluent speech NEURO: no focal motor/sensory deficits  LABORATORY DATA:  I have reviewed the data as listed  . CBC Latest Ref Rng & Units 03/01/2017 02/26/2017 02/23/2017  WBC 4.0 - 10.3 K/uL 11.6(H) 15.1(H) 13.4(H)  Hemoglobin 13.0 - 17.0 g/dL - - -  Hematocrit 16.1 - 49.9 % 36.1(L) 34.3(L) 32.5(L)  Platelets 140 - 400 K/uL 305 451(H) 496(H)    . CMP Latest Ref Rng & Units 03/01/2017 02/26/2017 02/23/2017  Glucose 70 - 140 mg/dL 161 99 096  BUN 7 - 26 mg/dL 16 11 14   Creatinine 0.70 - 1.30 mg/dL 0.45 4.09 8.11  Sodium 136 - 145 mmol/L 138 140 140  Potassium 3.5 - 5.1 mmol/L 4.6 4.2 4.4  Chloride 98 - 109 mmol/L 105 103 104  CO2 22 - 29 mmol/L 24 27 27   Calcium 8.4 - 10.4 mg/dL 9.2 8.9 9.2  Total Protein 6.4 - 8.3 g/dL 6.6 6.4 6.8  Total Bilirubin 0.2 - 1.2 mg/dL 0.3 0.3 0.4  Alkaline Phos 40 - 150 U/L 62 61 67  AST 5 - 34 U/L 15 14 29   ALT 0 - 55 U/L 38 45 77(H)    . Lab Results  Component Value Date   LDH 230 03/01/2017      RADIOGRAPHIC STUDIES: I have personally reviewed the radiological images as listed and agreed with the findings in the report. Dg Chest 2 View  Result Date: 02/12/2017 CLINICAL DATA:  Shortness of Breath EXAM: CHEST  2 VIEW COMPARISON:  None. FINDINGS: Lungs are clear. Heart size and pulmonary vascularity are normal. No adenopathy. No bone lesions. IMPRESSION: No edema or consolidation. Electronically Signed   By: Bretta Bang III M.D.   On: 02/12/2017 14:23   Ct Head Wo Contrast  Result Date: 02/12/2017 CLINICAL DATA:  Right-sided numbness. History of  sickle cell disease EXAM: CT HEAD WITHOUT CONTRAST TECHNIQUE: Contiguous axial images were obtained from the base of the skull through the vertex without intravenous contrast. COMPARISON:  None. FINDINGS: Brain: The ventricles are normal in size and configuration. There is no intracranial mass, hemorrhage, extra-axial fluid collection, or midline shift. Gray-white compartments are normal. No acute infarct evident. Vascular: No hyperdense vessels.  No vascular calcification evident. Skull: The bony calvarium appears intact. Sinuses/Orbits: Visualized paranasal sinuses are clear. Orbits appear symmetric bilaterally. Other: Mastoid air cells are clear. IMPRESSION: Study within normal limits. Electronically Signed   By: Bretta Bang III M.D.   On: 02/12/2017 07:34   Ct Angio Chest Pe W And/or Wo Contrast  Result Date: 02/12/2017 CLINICAL DATA:  Shortness of breath with exertion. Elevated D-dimer. Evaluate for pulmonary embolism. EXAM: CT ANGIOGRAPHY CHEST WITH CONTRAST TECHNIQUE: Multidetector CT imaging of the chest was performed using the standard protocol during bolus administration of intravenous contrast. Multiplanar CT image reconstructions and MIPs were obtained to evaluate the vascular anatomy. CONTRAST:  ISOVUE-370 IOPAMIDOL (ISOVUE-370) INJECTION 76% COMPARISON:  Chest x-ray from same day. FINDINGS: Cardiovascular: Suboptimal opacification of the pulmonary arteries due to bolus timing and patient's body habitus. No central or lobar pulmonary embolism. Normal heart size. No pericardial effusion. Normal caliber thoracic aorta. Mediastinum/Nodes: No enlarged mediastinal, hilar, or axillary lymph nodes. Thyroid gland, trachea, and esophagus demonstrate no significant findings. Lungs/Pleura: Lungs are clear. No pleural effusion or pneumothorax. Upper Abdomen: No acute abnormality. Musculoskeletal: Bilateral gynecomastia. No acute or significant osseous findings. Review of the MIP images confirms the  above findings. IMPRESSION: 1. No central or lobar pulmonary embolism. The segmental pulmonary arteries are not well evaluated due to bolus timing and patient's body habitus. 2. No acute intrathoracic process. Electronically Signed   By: Obie Dredge M.D.   On: 02/12/2017 16:06   Mr Brain Wo Contrast  Result Date: 02/12/2017 CLINICAL DATA:  Severe headache while at work last night. RIGHT-sided numbness. Ataxia, suspect stroke. History of sickle cell trait. EXAM: MRI HEAD WITHOUT CONTRAST TECHNIQUE: Axial and coronal diffusion weighted imaging, sagittal T1, axial T2 and axial T2  FLAIR obtained on a 1.5 tesla scanner. Patient was unable to tolerate further imaging. COMPARISON:  CT HEAD February 13, 2016 at 0709 hours. FINDINGS: Sequences are mildly motion degraded. BRAIN: No reduced diffusion to suggest acute ischemia, hyperacute demyelination or hypercellular tumor. No field inhomogeneity to suggest blood products though susceptibility weighted sequence not obtained. No intrinsic T1 shortening. He ventricles and sulci are normal for patient's age. No suspicious parenchymal signal, mass or mass effect. No abnormal extra-axial fluid collections. VASCULAR: Normal major vascular flow voids present at skull base. SKULL AND UPPER CERVICAL SPINE: Mild prevertebral soft tissue fullness. No abnormal sellar expansion. No suspicious calvarial bone marrow signal. Craniocervical junction maintained. SINUSES/ORBITS: Included paranasal sinuses are well aerated. Imaged mastoid air cells are well aerated. The included ocular globes and orbital contents are non-suspicious. OTHER: None. IMPRESSION: 1. Limited motion degraded MRI, patient was unable to tolerate complete examination. 2. Negative noncontrast MRI of the head. 3. Prevertebral fullness, seen with recent viral illness or immunocompromised states. Electronically Signed   By: Awilda Metro M.D.   On: 02/12/2017 13:49   Dg Chest Port 1 View  Result Date:  02/12/2017 CLINICAL DATA:  Catheter placement. EXAM: PORTABLE CHEST 1 VIEW COMPARISON:  Earlier today FINDINGS: Interval placement of right IJ catheter with tip in the projection of the distal SVC. No pneumothorax visualized. The heart size and mediastinal contours are within normal limits. Both lungs are clear. The visualized skeletal structures are unremarkable. IMPRESSION: No active disease. Electronically Signed   By: Signa Kell M.D.   On: 02/12/2017 20:37    ASSESSMENT & PLAN:   27 y.o. 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 On presentation hemoglobin is down to 5.8 with elevated LDH of 1300s. LDH improved to 200's. hgb up to 11.7 with Robust reticulocytosis. -HIV test -non reactive Last plasma exchange done on 02/19/2016 ADAM TS 13 post treatment 48.5  2. 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 Platelets were 16k on initial presentation. Now resolved with platelets to 305k With no overt bleeding noted at this time.  3. Left sided mild weakness/numbness - resolved No overt CVA at this time an MRI.  4. Elevated ferritin on hospital admission - due to overt hemolysis.   Plan  -No clinical or evidence of relapse TTP at this time- PRBC transfusion prn to maintain Hgb >7 or if symptomatic (has not needed this since plasma exchange has be initiated) -He will continue on folic acid 2 mg, B12 1000 mcg daily  -continue Prednisone with taper dosage:  30mg  po daily x5 days then 20mg  po daily till further taper instructions.  -Patient counseled about concerning clinical symptoms and signs to watch for and counseled to return to ED stat if any of these concerns arise. -avoid NSAIDS. -I discussed ideally it is recommended he not do heavy lifting or opporate heavy machinery for the next month post remission as this is the highest risk of recurrence.I filled  out his work restriction forms.  5. URI symptoms  -Viral respiratory panel - neg. Symptoms resolved    RTC with Dr Candise Che in 1 week on 03/08/2017 with labs (okay to overbook).   All of the patients questions were answered with apparent satisfaction. The patient knows to call the clinic with any problems, questions or concerns.  I spent 20 minutes counseling the patient face to face. The total time spent in the  appointment was 20 minutes and more than 50% was on counseling and direct patient cares.    Wyvonnia LoraGautam Kale MD MS AAHIVMS [facility] Hematology/Oncology Physician [facility]  (Office):       [phone] (Work cell):  [phone] (Fax):           [phone]  03/03/2017 11:30 PM   This document serves as a record of services personally performed by Wyvonnia LoraGautam Kale, MD. It was created on his behalf by Delphina CahillAmoya Bennett, a trained medical scribe. The creation of this record is based on the scribe's personal observations and the provider's statements to them.    .I have reviewed the above documentation for accuracy and completeness, and I agree with the above. Johney Maine.Gautam Kishore Kale MD MS

## 2017-02-27 LAB — ADAMTS13 ACTIVITY REFLEX

## 2017-02-27 LAB — ADAMTS13 ACTIVITY: ADAMTS 13 ACTIVITY: 48.5 % — AB (ref >66.8)

## 2017-03-01 ENCOUNTER — Encounter: Payer: Self-pay | Admitting: Hematology

## 2017-03-01 ENCOUNTER — Inpatient Hospital Stay: Payer: Managed Care, Other (non HMO)

## 2017-03-01 ENCOUNTER — Inpatient Hospital Stay (HOSPITAL_BASED_OUTPATIENT_CLINIC_OR_DEPARTMENT_OTHER): Payer: Managed Care, Other (non HMO) | Admitting: Hematology

## 2017-03-01 VITALS — BP 137/87 | HR 93 | Temp 98.7°F | Resp 20 | Ht 71.0 in | Wt 290.5 lb

## 2017-03-01 DIAGNOSIS — M311 Thrombotic microangiopathy: Secondary | ICD-10-CM

## 2017-03-01 DIAGNOSIS — Z7952 Long term (current) use of systemic steroids: Secondary | ICD-10-CM | POA: Diagnosis not present

## 2017-03-01 DIAGNOSIS — M3119 Other thrombotic microangiopathy: Secondary | ICD-10-CM

## 2017-03-01 DIAGNOSIS — D696 Thrombocytopenia, unspecified: Secondary | ICD-10-CM | POA: Diagnosis not present

## 2017-03-01 DIAGNOSIS — D594 Other nonautoimmune hemolytic anemias: Secondary | ICD-10-CM

## 2017-03-01 DIAGNOSIS — R200 Anesthesia of skin: Secondary | ICD-10-CM

## 2017-03-01 DIAGNOSIS — Z79899 Other long term (current) drug therapy: Secondary | ICD-10-CM

## 2017-03-01 DIAGNOSIS — Z87891 Personal history of nicotine dependence: Secondary | ICD-10-CM

## 2017-03-01 DIAGNOSIS — R531 Weakness: Secondary | ICD-10-CM

## 2017-03-01 LAB — CMP (CANCER CENTER ONLY)
ALK PHOS: 62 U/L (ref 40–150)
ALT: 38 U/L (ref 0–55)
AST: 15 U/L (ref 5–34)
Albumin: 3.8 g/dL (ref 3.5–5.0)
Anion gap: 9 (ref 3–11)
BUN: 16 mg/dL (ref 7–26)
CALCIUM: 9.2 mg/dL (ref 8.4–10.4)
CO2: 24 mmol/L (ref 22–29)
CREATININE: 1.09 mg/dL (ref 0.70–1.30)
Chloride: 105 mmol/L (ref 98–109)
Glucose, Bld: 127 mg/dL (ref 70–140)
Potassium: 4.6 mmol/L (ref 3.5–5.1)
Sodium: 138 mmol/L (ref 136–145)
Total Bilirubin: 0.3 mg/dL (ref 0.2–1.2)
Total Protein: 6.6 g/dL (ref 6.4–8.3)

## 2017-03-01 LAB — RETICULOCYTES
RBC.: 3.55 MIL/uL — AB (ref 4.20–5.82)
RETIC CT PCT: 6.4 % — AB (ref 0.8–1.8)
Retic Count, Absolute: 227.2 10*3/uL — ABNORMAL HIGH (ref 34.8–93.9)

## 2017-03-01 LAB — CBC WITH DIFFERENTIAL (CANCER CENTER ONLY)
BASOS PCT: 0 %
Basophils Absolute: 0 10*3/uL (ref 0.0–0.1)
EOS ABS: 0 10*3/uL (ref 0.0–0.5)
Eosinophils Relative: 0 %
HCT: 36.1 % — ABNORMAL LOW (ref 38.4–49.9)
Hemoglobin: 11.7 g/dL — ABNORMAL LOW (ref 13.0–17.1)
Lymphocytes Relative: 18 %
Lymphs Abs: 2.1 10*3/uL (ref 0.9–3.3)
MCH: 33 pg (ref 27.2–33.4)
MCHC: 32.4 g/dL (ref 32.0–36.0)
MCV: 101.7 fL — ABNORMAL HIGH (ref 79.3–98.0)
MONOS PCT: 3 %
Monocytes Absolute: 0.4 10*3/uL (ref 0.1–0.9)
NEUTROS PCT: 79 %
Neutro Abs: 9.1 10*3/uL — ABNORMAL HIGH (ref 1.5–6.5)
PLATELETS: 305 10*3/uL (ref 140–400)
RBC: 3.55 MIL/uL — AB (ref 4.20–5.82)
RDW: 17.5 % — ABNORMAL HIGH (ref 11.0–14.6)
WBC: 11.6 10*3/uL — AB (ref 4.0–10.3)

## 2017-03-01 LAB — LACTATE DEHYDROGENASE: LDH: 230 U/L (ref 125–245)

## 2017-03-08 ENCOUNTER — Inpatient Hospital Stay (HOSPITAL_BASED_OUTPATIENT_CLINIC_OR_DEPARTMENT_OTHER): Payer: Managed Care, Other (non HMO) | Admitting: Hematology

## 2017-03-08 ENCOUNTER — Inpatient Hospital Stay: Payer: Managed Care, Other (non HMO)

## 2017-03-08 ENCOUNTER — Encounter: Payer: Self-pay | Admitting: Hematology

## 2017-03-08 ENCOUNTER — Telehealth: Payer: Self-pay | Admitting: Hematology

## 2017-03-08 VITALS — BP 118/74 | HR 92 | Temp 98.8°F | Resp 18 | Ht 71.0 in | Wt 287.9 lb

## 2017-03-08 DIAGNOSIS — Z87891 Personal history of nicotine dependence: Secondary | ICD-10-CM

## 2017-03-08 DIAGNOSIS — R0602 Shortness of breath: Secondary | ICD-10-CM

## 2017-03-08 DIAGNOSIS — M3119 Other thrombotic microangiopathy: Secondary | ICD-10-CM

## 2017-03-08 DIAGNOSIS — M311 Thrombotic microangiopathy: Secondary | ICD-10-CM | POA: Diagnosis not present

## 2017-03-08 DIAGNOSIS — Z79899 Other long term (current) drug therapy: Secondary | ICD-10-CM

## 2017-03-08 DIAGNOSIS — D573 Sickle-cell trait: Secondary | ICD-10-CM | POA: Diagnosis not present

## 2017-03-08 DIAGNOSIS — D696 Thrombocytopenia, unspecified: Secondary | ICD-10-CM

## 2017-03-08 DIAGNOSIS — R200 Anesthesia of skin: Secondary | ICD-10-CM | POA: Diagnosis not present

## 2017-03-08 DIAGNOSIS — R531 Weakness: Secondary | ICD-10-CM

## 2017-03-08 DIAGNOSIS — Z7952 Long term (current) use of systemic steroids: Secondary | ICD-10-CM | POA: Diagnosis not present

## 2017-03-08 DIAGNOSIS — D594 Other nonautoimmune hemolytic anemias: Secondary | ICD-10-CM | POA: Diagnosis not present

## 2017-03-08 LAB — CBC WITH DIFFERENTIAL (CANCER CENTER ONLY)
Basophils Absolute: 0 10*3/uL (ref 0.0–0.1)
Basophils Relative: 0 %
EOS ABS: 0.1 10*3/uL (ref 0.0–0.5)
Eosinophils Relative: 1 %
HEMATOCRIT: 39 % (ref 38.4–49.9)
HEMOGLOBIN: 13.1 g/dL (ref 13.0–17.1)
LYMPHS ABS: 4.7 10*3/uL — AB (ref 0.9–3.3)
LYMPHS PCT: 37 %
MCH: 33.3 pg (ref 27.2–33.4)
MCHC: 33.6 g/dL (ref 32.0–36.0)
MCV: 99.2 fL — AB (ref 79.3–98.0)
Monocytes Absolute: 0.7 10*3/uL (ref 0.1–0.9)
Monocytes Relative: 5 %
NEUTROS PCT: 57 %
Neutro Abs: 7.3 10*3/uL — ABNORMAL HIGH (ref 1.5–6.5)
PLATELETS: 274 10*3/uL (ref 140–400)
RBC: 3.93 MIL/uL — AB (ref 4.20–5.82)
RDW: 15.9 % — ABNORMAL HIGH (ref 11.0–14.6)
WBC: 12.8 10*3/uL — AB (ref 4.0–10.3)

## 2017-03-08 LAB — CMP (CANCER CENTER ONLY)
ALT: 25 U/L (ref 0–55)
AST: 16 U/L (ref 5–34)
Albumin: 4.1 g/dL (ref 3.5–5.0)
Alkaline Phosphatase: 66 U/L (ref 40–150)
Anion gap: 10 (ref 3–11)
BUN: 10 mg/dL (ref 7–26)
CO2: 27 mmol/L (ref 22–29)
Calcium: 9.5 mg/dL (ref 8.4–10.4)
Chloride: 103 mmol/L (ref 98–109)
Creatinine: 1.12 mg/dL (ref 0.70–1.30)
GFR, Est AFR Am: >60 mL/min (ref >60)
GFR, Estimated: >60 mL/min (ref >60)
Glucose, Bld: 102 mg/dL (ref 70–140)
Potassium: 3.7 mmol/L (ref 3.5–5.1)
Sodium: 140 mmol/L (ref 136–145)
Total Bilirubin: 0.4 mg/dL (ref 0.2–1.2)
Total Protein: 7.1 g/dL (ref 6.4–8.3)

## 2017-03-08 LAB — RETICULOCYTES
RBC.: 3.93 MIL/uL — ABNORMAL LOW (ref 4.20–5.82)
RETIC COUNT ABSOLUTE: 117.9 10*3/uL — AB (ref 34.8–93.9)
Retic Ct Pct: 3 % — ABNORMAL HIGH (ref 0.8–1.8)

## 2017-03-08 LAB — LACTATE DEHYDROGENASE: LDH: 218 U/L (ref 125–245)

## 2017-03-08 NOTE — Telephone Encounter (Signed)
Gave patient avs and calendar with appts per 2/11 los.  °

## 2017-03-08 NOTE — Progress Notes (Signed)
HEMATOLOGY/ONCOLOGY CLINIC NOTE  Date of Service:  03/08/17   Patient Care Team: Patient, No Pcp Per as PCP - General (General Practice)  CHIEF COMPLAINTS  F/u for TTP   HISTORY OF PRESENTING ILLNESS:  Richard [name] is a wonderful 27 y.o. male who has been referred to us by Fayrene HelperBowie Tran, PA-C in the Emergency Department for evaluation and management of new onset severe anemia and thrombocytopenia.   Patient has received Benadryl and is currently too sleepy to provide much information. Most of the information was obtained from the ED physician and confirmed with the fiance at bedside and key elements were confirmed with patient.   Patient fiancee noted that the patient had recurrent flu-like sx recently and he was treated with amoxacillin.   Prior to, [facility] and he tested negative for flu and strep and was prescribed Theraflu. Steffanie RainwaterFiancee notes that since then the patient symptoms progressively worsened. Patient was evaluated in the ED with a HA on yesterday and his CT Head showed no abnormalities. Per the Provider in the ED, patient was noted to have an abnormal gait favoring his left leg along with mild decreased sensation and minimal pronator drift. Cleta AlbertsFiancee, Laura also notes that the patient had progressive SOB x [redacted] week along with right sided numbness that she noticed last night along with slurred speech.    INTERVAL HISTORY   Richard [name] is here for a follow up for his TTP currently in remission. The patient's last visit with us was on 03/01/17. The pt reports that he is doing well overall. He reports that he begins 20mg  Prednisone tomorrow from 30mg . He notes he has returned to work without problems or difficulties.   Lab results today (03/08/17) of CBC, CMP iis as follows: all values are WNL except for WBC at 12.8k, RBC at 3.93, MCV at 99.2, RDW at 15.9, Neutro Abs at 7.3k, Lymph Abs at 4.7, and Retic Count, Abs at  117.9. LDH (03/08/17) is at 218.  Regrding ADAMTS13 Activity (03/08/17): have normalized to 76.4  On review of systems, pt reports clear urine and denies mouth sores, fevers, chills, weakness, CP, leg swelling.    MEDICAL HISTORY:  Past Medical History:  Diagnosis Date  . Sickle cell trait (HCC)     SURGICAL HISTORY: History reviewed. No pertinent surgical history.  SOCIAL HISTORY: Social History   Socioeconomic History  . Marital status: Single    Spouse name: Not on file  . Number of children: Not on file  . Years of education: Not on file  . Highest education level: Not on file  Social Needs  . Financial resource strain: Not on file  . Food insecurity - worry: Not on file  . Food insecurity - inability: Not on file  . Transportation needs - medical: Not on file  . Transportation needs - non-medical: Not on file  Occupational History  . Not on file  Tobacco Use  . Smoking status: Former Smoker    Packs/day: 0.50    Types: Cigarettes    Last attempt to quit: 01/2017    Years since quitting: 0.1  . Smokeless tobacco: Never Used  Substance and Sexual Activity  . Alcohol use: No    Frequency: Never  . Drug use: Yes    Types: Marijuana  . Sexual activity: Not on file  Other Topics Concern  . Not on file  Social History Narrative  . Not on file  FAMILY HISTORY: Family History  Problem Relation Age of Onset  . Cancer Other   . Diabetes Other   . Hypertension Other     ALLERGIES:  has No Known Allergies.  MEDICATIONS:  Current Outpatient Medications  Medication Sig Dispense Refill  . folic acid (FOLVITE) 1 MG tablet Take 1 tablet (1 mg total) by mouth daily. 30 tablet 0  . Multiple Vitamins-Minerals (MULTIVITAMIN WITH MINERALS) tablet Take 1 tablet by mouth daily. 120 tablet 2  . predniSONE (DELTASONE) 10 MG tablet 40mg  po daily x 5 days then 30mg  po daily x 5 days then 20mg  po daily till further taper instructions 60 tablet 0  . vitamin B-12  (CYANOCOBALAMIN) 1000 MCG tablet Take 1 tablet (1,000 mcg total) by mouth daily. 30 tablet 0   No current facility-administered medications for this visit.     REVIEW OF SYSTEMS:    .10 Point review of Systems was done is negative except as noted above.   PHYSICAL EXAMINATION: ECOG PERFORMANCE STATUS: 0 - Asymptomatic  . Vitals:   03/08/17 0933  BP: 118/74  Pulse: 92  Resp: 18  Temp: 98.8 F (37.1 C)  SpO2: 100%   Filed Weights   03/08/17 0933  Weight: 287 lb 14.4 oz (130.6 kg)   .Body mass index is 40.15 kg/m.  Marland Kitchen GENERAL:alert, in no acute distress and comfortable SKIN: no acute rashes, no significant lesions EYES: conjunctiva are pink and non-injected, sclera anicteric OROPHARYNX: MMM, no exudates, no oropharyngeal erythema or ulceration NECK: supple, no JVD LYMPH:  no palpable lymphadenopathy in the cervical, axillary or inguinal regions LUNGS: clear to auscultation b/l with normal respiratory effort HEART: regular rate & rhythm ABDOMEN:  normoactive bowel sounds , non tender, not distended. Extremity: no pedal edema PSYCH: alert & oriented x 3 with fluent speech NEURO: no focal motor/sensory deficits  LABORATORY DATA:  I have reviewed the data as listed  . CBC Latest Ref Rng & Units 03/08/2017 03/01/2017 02/26/2017  WBC 4.0 - 10.3 K/uL 12.8(H) 11.6(H) 15.1(H)  Hemoglobin 13.0 - 17.0 g/dL - - -  Hematocrit 40.9 - 49.9 % 39.0 36.1(L) 34.3(L)  Platelets 140 - 400 K/uL 274 305 451(H)    . CMP Latest Ref Rng & Units 03/08/2017 03/01/2017 02/26/2017  Glucose 70 - 140 mg/dL 811 914 99  BUN 7 - 26 mg/dL 10 16 11   Creatinine 0.70 - 1.30 mg/dL 7.82 9.56 2.13  Sodium 136 - 145 mmol/L 140 138 140  Potassium 3.5 - 5.1 mmol/L 3.7 4.6 4.2  Chloride 98 - 109 mmol/L 103 105 103  CO2 22 - 29 mmol/L 27 24 27   Calcium 8.4 - 10.4 mg/dL 9.5 9.2 8.9  Total Protein 6.4 - 8.3 g/dL 7.1 6.6 6.4  Total Bilirubin 0.2 - 1.2 mg/dL 0.4 0.3 0.3  Alkaline Phos 40 - 150 U/L 66 62 61  AST 5  - 34 U/L 16 15 14   ALT 0 - 55 U/L 25 38 45    . Lab Results  Component Value Date   LDH 218 03/08/2017      RADIOGRAPHIC STUDIES: I have personally reviewed the radiological images as listed and agreed with the findings in the report. No results found.  ASSESSMENT & PLAN:    27 y.o. 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 On presentation hemoglobin is down to 5.8 with elevated LDH of 1300s. LDH  improved to 200's. hgb up to 11.7 with Robust reticulocytosis. -HIV test -non reactive Last plasma exchange done on 02/19/2016 ADAM TS 13 post treatment 48.5 now improved to 76.4  2. 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 Platelets were 16k on initial presentation. Now resolved with platelets to 305k With no overt bleeding noted at this time.  3. Left sided mild weakness/numbness - resolved No overt CVA at this time an MRI.  4. Elevated ferritin on hospital admission - due to overt hemolysis.    Plan  -No clinical or evidence of relapse TTP at this time. hgb stable, LDH wnl, ADAM TS13 levels wnl and improved. - PRBC transfusion prn to maintain Hgb >7 or if symptomatic (has not needed this since plasma exchange has been initiated) -He will continue on folic acid 2 mg, B12 1000 mcg daily  -Discussed pt labwork today; Hgb normalized and platelets stable; blood chemistries are normal. -Pt begins 20mg  Prednisone tomorrow and we will have him repeat this for 5 days, then taper to 10mg  Prednisone for 5 days.  -We will see him in another 10 days.   -Advised eating yogurt or food with live cultures to reduce his risk of thrush while taking steroids.   -Patient counseled about concerning clinical symptoms and signs to watch for and counseled to return to ED stat if any of these concerns arise. -avoid NSAIDS. -I discussed that we could easy his job  restriction if he continues to be in remission from his TTP over the next several weeks.      RTC with Dr Candise Che in 10 days with labs   All of the patients questions were answered with apparent satisfaction. The patient knows to call the clinic with any problems, questions or concerns.   . The total time spent in the appointment was 20 minutes and more than 50% was on counseling and direct patient cares.     Wyvonnia Lora MD MS AAHIVMS [facility] Hematology/Oncology Physician [facility]  (Office):       [phone] (Work cell):  [phone] (Fax):           [phone]   This document serves as a record of services personally performed by Wyvonnia Lora, MD. It was created on his behalf by Marcelline Mates, a trained medical scribe. The creation of this record is based on the scribe's personal observations and the provider's statements to them.   .I have reviewed the above documentation for accuracy and completeness, and I agree with the above. Johney Maine MD MS

## 2017-03-09 LAB — ADAMTS13 ACTIVITY REFLEX

## 2017-03-09 LAB — ADAMTS13 ACTIVITY: Adamts 13 Activity: 76.4 % (ref >66.8)

## 2017-03-17 NOTE — Progress Notes (Signed)
HEMATOLOGY/ONCOLOGY CLINIC NOTE  Date of Service:  03/18/17   Patient Care Team: Patient, No Pcp Per as PCP - General (General Practice)  CHIEF COMPLAINTS  F/u for TTP  HISTORY OF PRESENTING ILLNESS:  Richard MaudlinDareon [name] is a wonderful 27 y.o. male who has been referred to us by Fayrene HelperBowie Tran, PA-C in the Emergency Department for evaluation and management of new onset severe anemia and thrombocytopenia.   Patient has received Benadryl and is currently too sleepy to provide much information. Most of the information was obtained from the ED physician and confirmed with the fiance at bedside and key elements were confirmed with patient.   Patient fiancee noted that the patient had recurrent flu-like sx recently and he was treated with amoxacillin.   Prior to, the [facility] symptoms and he tested negative for flu and strep and was prescribed Theraflu. Richard RainwaterFiancee notes that since then the patient symptoms progressively worsened. Patient was evaluated in the ED with a HA on yesterday and his CT Head showed no abnormalities. Per the Provider in the ED, patient was noted to have an abnormal gait favoring his left leg along with mild decreased sensation and minimal pronator drift. Cleta AlbertsFiancee, Richard [name] also notes that the patient had progressive SOB x [redacted] week along with right sided numbness that she noticed last night along with slurred speech.    INTERVAL HISTORY   Richard [name] is here for a follow up for his TTP currently in remission. The patient's last visit with us was on 03/08/17. The pt reports that he is doing well overall and feels as though he has returned to his normal self. He reports taking Prednisone 20mg  today. The pt reports smoking one pack of cigarettes every 3 days and is trying to quit, and has decreased his smoking from 1 pack each day.  No FND.clear urine.   Lab results today (03/18/17) of CBC and Reticulocytes is as follows: all values are WNL  except for WBC at 10.4k, RDW at 15.2, Lymphs Abs at 4.1. CMP is WNL AdamTS13 activity has normalized.   On review of systems, pt denies abdominal pains, leg swelling, and any other symptoms.   MEDICAL HISTORY:  Past Medical History:  Diagnosis Date  . Sickle cell trait (HCC)     SURGICAL HISTORY: History reviewed. No pertinent surgical history.  SOCIAL HISTORY: Social History   Socioeconomic History  . Marital status: Single    Spouse name: Not on file  . Number of children: Not on file  . Years of education: Not on file  . Highest education level: Not on file  Social Needs  . Financial resource strain: Not on file  . Food insecurity - worry: Not on file  . Food insecurity - inability: Not on file  . Transportation needs - medical: Not on file  . Transportation needs - non-medical: Not on file  Occupational History  . Not on file  Tobacco Use  . Smoking status: Former Smoker    Packs/day: 0.50    Types: Cigarettes    Last attempt to quit: 01/2017    Years since quitting: 0.1  . Smokeless tobacco: Never Used  Substance and Sexual Activity  . Alcohol use: No    Frequency: Never  . Drug use: Yes    Types: Marijuana  . Sexual activity: Not on file  Other Topics Concern  . Not on file  Social History Narrative  . Not on file  FAMILY HISTORY: Family History  Problem Relation Age of Onset  . Cancer Other   . Diabetes Other   . Hypertension Other     ALLERGIES:  has No Known Allergies.  MEDICATIONS:  Current Outpatient Medications  Medication Sig Dispense Refill  . folic acid (FOLVITE) 1 MG tablet Take 1 tablet (1 mg total) by mouth daily. 30 tablet 0  . Multiple Vitamins-Minerals (MULTIVITAMIN WITH MINERALS) tablet Take 1 tablet by mouth daily. 120 tablet 2  . predniSONE (DELTASONE) 5 MG tablet 15mg  po daily x 3 days then 10mg  po daily x 4 days then 5mg  po daily x 7 days then stop 25 tablet 0  . vitamin B-12 (CYANOCOBALAMIN) 1000 MCG tablet Take 1 tablet  (1,000 mcg total) by mouth daily. 30 tablet 0   No current facility-administered medications for this visit.     REVIEW OF SYSTEMS:    .10 Point review of Systems was done is negative except as noted above.  PHYSICAL EXAMINATION: ECOG PERFORMANCE STATUS: 0 - Asymptomatic  . Vitals:   03/18/17 0936  BP: (!) 108/95  Pulse: 91  Resp: 18  Temp: 98.7 F (37.1 C)  SpO2: 99%   Filed Weights   03/18/17 0936  Weight: 280 lb 4.8 oz (127.1 kg)   .Body mass index is 39.09 kg/m.  Marland Kitchen GENERAL:alert, in no acute distress and comfortable SKIN: no acute rashes, no significant lesions EYES: conjunctiva are pink and non-injected, sclera anicteric OROPHARYNX: MMM, no exudates, no oropharyngeal erythema or ulceration NECK: supple, no JVD LYMPH:  no palpable lymphadenopathy in the cervical, axillary or inguinal regions LUNGS: clear to auscultation b/l with normal respiratory effort HEART: regular rate & rhythm ABDOMEN:  normoactive bowel sounds , non tender, not distended. Extremity: no pedal edema PSYCH: alert & oriented x 3 with fluent speech NEURO: no focal motor/sensory deficits  LABORATORY DATA:  I have reviewed the data as listed  . CBC Latest Ref Rng & Units 03/18/2017 03/08/2017 03/01/2017  WBC 4.0 - 10.3 K/uL 10.4(H) 12.8(H) 11.6(H)  Hemoglobin 13.0 - 17.0 g/dL - - -  Hematocrit 16.1 - 49.9 % 43.1 39.0 36.1(L)  Platelets 140 - 400 K/uL 294 274 305  hgb 14.5  . CMP Latest Ref Rng & Units 03/18/2017 03/08/2017 03/01/2017  Glucose 70 - 140 mg/dL 86 096 045  BUN 7 - 26 mg/dL 10 10 16   Creatinine 0.70 - 1.30 mg/dL 4.09 8.11 9.14  Sodium 136 - 145 mmol/L 139 140 138  Potassium 3.5 - 5.1 mmol/L 4.0 3.7 4.6  Chloride 98 - 109 mmol/L 104 103 105  CO2 22 - 29 mmol/L 24 27 24   Calcium 8.4 - 10.4 mg/dL 9.9 9.5 9.2  Total Protein 6.4 - 8.3 g/dL 7.5 7.1 6.6  Total Bilirubin 0.2 - 1.2 mg/dL 0.6 0.4 0.3  Alkaline Phos 40 - 150 U/L 76 66 62  AST 5 - 34 U/L 21 16 15   ALT 0 - 55 U/L 21 25  38   . Lab Results  Component Value Date   LDH 279 (H) 03/18/2017      RADIOGRAPHIC STUDIES: I have personally reviewed the radiological images as listed and agreed with the findings in the report. No results found.  ASSESSMENT & PLAN:    27 y.o. 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 On presentation hemoglobin was down to 5.8 with elevated LDH of  1300s. LDH improved to 200's. Hgb up to 14.5 with Robust reticulocytosis. -HIV test -non reactive Last plasma exchange done on 02/19/2016 ADAM TS 13 post treatment 48.5 now improved to 76.4  2. 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 Platelets were 16k on initial presentation. Now resolved with Platelets at 294k. With no overt bleeding noted at this time.  3. Left sided mild weakness/numbness - resolved No overt CVA at this time an MRI.    Plan  -No clinical or lab evidence of relapse TTP at this time. hgb stable, bilirubin wnl, ADAM TS13 levels wnl and improved. -LDH borderline elevated - will need to be monitored. - PRBC transfusion prn to maintain Hgb >7 or if symptomatic (has not needed this since plasma exchange has been initiated) -He will continue on folic acid 2 mg, B12 1000 mcg daily  -Avoid NSAIDs -I discussed that we could ease his job restriction if he continues to be in remission from his TTP over the next several weeks; he reports wanting to wait until next visit to do this. -Discussed pt labwork today; Hgb normalized at 14.5, Platelets WNL at 294k.  -Tapering Prednisone as follows: 15mg  for 5 days, 10mg  for 5 days, 5mg  for 5 days -Counseled pt in smoking cessation.    RTC with Dr Candise Che in 2 weeks with labs   All of the patients questions were answered with apparent satisfaction. The patient knows to call the clinic with any problems, questions or concerns.  . The total time  spent in the appointment was 15 minutes and more than 50% was on counseling and direct patient cares.    Wyvonnia Lora MD MS AAHIVMS [facility] Hematology/Oncology Physician [facility]  (Office):       [phone] (Work cell):  [phone] (Fax):           [phone]  This document serves as a record of services personally performed by Wyvonnia Lora, MD. It was created on his behalf by Marcelline Mates, a trained medical scribe. The creation of this record is based on the scribe's personal observations and the provider's statements to them.   .I have reviewed the above documentation for accuracy and completeness, and I agree with the above. Johney Maine MD MS

## 2017-03-18 ENCOUNTER — Telehealth: Payer: Self-pay

## 2017-03-18 ENCOUNTER — Inpatient Hospital Stay (HOSPITAL_BASED_OUTPATIENT_CLINIC_OR_DEPARTMENT_OTHER): Payer: Managed Care, Other (non HMO) | Admitting: Hematology

## 2017-03-18 ENCOUNTER — Encounter: Payer: Self-pay | Admitting: Hematology

## 2017-03-18 ENCOUNTER — Inpatient Hospital Stay: Payer: Managed Care, Other (non HMO)

## 2017-03-18 VITALS — BP 108/95 | HR 91 | Temp 98.7°F | Resp 18 | Ht 71.0 in | Wt 280.3 lb

## 2017-03-18 DIAGNOSIS — D696 Thrombocytopenia, unspecified: Secondary | ICD-10-CM | POA: Diagnosis not present

## 2017-03-18 DIAGNOSIS — R531 Weakness: Secondary | ICD-10-CM

## 2017-03-18 DIAGNOSIS — D594 Other nonautoimmune hemolytic anemias: Secondary | ICD-10-CM | POA: Diagnosis not present

## 2017-03-18 DIAGNOSIS — Z87891 Personal history of nicotine dependence: Secondary | ICD-10-CM

## 2017-03-18 DIAGNOSIS — Z79899 Other long term (current) drug therapy: Secondary | ICD-10-CM | POA: Diagnosis not present

## 2017-03-18 DIAGNOSIS — M311 Thrombotic microangiopathy: Secondary | ICD-10-CM

## 2017-03-18 DIAGNOSIS — M3119 Other thrombotic microangiopathy: Secondary | ICD-10-CM

## 2017-03-18 DIAGNOSIS — R0602 Shortness of breath: Secondary | ICD-10-CM | POA: Diagnosis not present

## 2017-03-18 DIAGNOSIS — Z7952 Long term (current) use of systemic steroids: Secondary | ICD-10-CM | POA: Diagnosis not present

## 2017-03-18 DIAGNOSIS — R200 Anesthesia of skin: Secondary | ICD-10-CM

## 2017-03-18 DIAGNOSIS — D573 Sickle-cell trait: Secondary | ICD-10-CM | POA: Diagnosis not present

## 2017-03-18 LAB — CBC WITH DIFFERENTIAL (CANCER CENTER ONLY)
Basophils Absolute: 0 10*3/uL (ref 0.0–0.1)
Basophils Relative: 0 %
EOS PCT: 0 %
Eosinophils Absolute: 0 10*3/uL (ref 0.0–0.5)
HCT: 43.1 % (ref 38.4–49.9)
Hemoglobin: 14.5 g/dL (ref 13.0–17.1)
LYMPHS ABS: 4.1 10*3/uL — AB (ref 0.9–3.3)
LYMPHS PCT: 39 %
MCH: 32.4 pg (ref 27.2–33.4)
MCHC: 33.6 g/dL (ref 32.0–36.0)
MCV: 96.2 fL (ref 79.3–98.0)
MONO ABS: 0.5 10*3/uL (ref 0.1–0.9)
MONOS PCT: 5 %
Neutro Abs: 5.7 10*3/uL (ref 1.5–6.5)
Neutrophils Relative %: 56 %
PLATELETS: 294 10*3/uL (ref 140–400)
RBC: 4.48 MIL/uL (ref 4.20–5.82)
RDW: 15.2 % — AB (ref 11.0–14.6)
WBC Count: 10.4 10*3/uL — ABNORMAL HIGH (ref 4.0–10.3)

## 2017-03-18 LAB — CMP (CANCER CENTER ONLY)
ALBUMIN: 4.5 g/dL (ref 3.5–5.0)
ALT: 21 U/L (ref 0–55)
AST: 21 U/L (ref 5–34)
Alkaline Phosphatase: 76 U/L (ref 40–150)
Anion gap: 11 (ref 3–11)
BUN: 10 mg/dL (ref 7–26)
CHLORIDE: 104 mmol/L (ref 98–109)
CO2: 24 mmol/L (ref 22–29)
Calcium: 9.9 mg/dL (ref 8.4–10.4)
Creatinine: 1.17 mg/dL (ref 0.70–1.30)
GFR, Est AFR Am: >60 mL/min (ref >60)
GFR, Estimated: >60 mL/min (ref >60)
GLUCOSE: 86 mg/dL (ref 70–140)
POTASSIUM: 4 mmol/L (ref 3.5–5.1)
Sodium: 139 mmol/L (ref 136–145)
Total Bilirubin: 0.6 mg/dL (ref 0.2–1.2)
Total Protein: 7.5 g/dL (ref 6.4–8.3)

## 2017-03-18 LAB — RETICULOCYTES
RBC.: 4.48 MIL/uL (ref 4.20–5.82)
Retic Count, Absolute: 49.3 10*3/uL (ref 34.8–93.9)
Retic Ct Pct: 1.1 % (ref 0.8–1.8)

## 2017-03-18 LAB — LACTATE DEHYDROGENASE: LDH: 279 U/L — AB (ref 125–245)

## 2017-03-18 MED ORDER — PREDNISONE 5 MG PO TABS: ORAL_TABLET | ORAL | 0 refills | Status: DC

## 2017-03-18 NOTE — Telephone Encounter (Signed)
  Printed avs and calender of upcoming appointment. Per 2/21 los 

## 2017-03-31 NOTE — Progress Notes (Signed)
HEMATOLOGY/ONCOLOGY CLINIC NOTE  Date of Service:  04/01/17   Patient Care Team: Patient, No Pcp Per as PCP - General (General Practice)  CHIEF COMPLAINTS  F/u for continued management of TTP  HISTORY OF PRESENTING ILLNESS:  Richard [name] is a wonderful 27 y.o. male who has been referred to [place] by Richard Helper, PA-C in the Emergency Department for evaluation and management of new onset severe anemia and thrombocytopenia.   Patient has received Benadryl and is currently too sleepy to provide much information. Most of the information was obtained from the ED physician and confirmed with the fiance at bedside and key elements were confirmed with patient.   Patient fiancee noted that the patient had recurrent flu-like sx recently and he was treated with amoxacillin.   Prior to, the patient was evaluated at [facility] 2 weeks ago for flu-like symptoms and he tested negative for flu and strep and was prescribed Theraflu. Richard [name] notes that since then the patient symptoms progressively worsened. Patient was evaluated in the ED with a HA on yesterday and his CT Head showed no abnormalities. Per the Provider in the ED, patient was noted to have an abnormal gait favoring his left leg along with mild decreased sensation and minimal pronator drift. Richard [name] also notes that the patient had progressive SOB x [redacted] week along with right sided numbness that she noticed last night along with slurred speech.    INTERVAL HISTORY   Richard [name] is here for a follow up for his TTP currently in remission. The patient's last visit with [place] was on 03/18/17. The pt reports that he is doing well overall and continues to feel like his old self again.   The pt notes that he is currently taking Prednisone 5mg  and will complete his Prednisone treatment in two days.  He notes that he is taking a multivitamin daily and Vitamin B12 but has stopped taking Folic Acid which is reasonable at this time.   Lab results today  (04/01/17) of CBC, Reticulocytes is as follows: all values are WNL except for Lymphs Abs at 3.9k, Retic Ct Pct at 0.7%, and Retic Count Absolute at 33.5. CMP 03/31/17 WNL - no elevation in bilirubin LDH 03/31/17 still slightly elevated at 283  On review of systems, pt denies fevers, chills, new muscle weakness, changes in bowel habits, abdominal pains, leg swelling, and any other symptoms.   MEDICAL HISTORY:  Past Medical History:  Diagnosis Date  . Sickle cell trait (HCC)     SURGICAL HISTORY: No past surgical history on file.  SOCIAL HISTORY: Social History   Socioeconomic History  . Marital status: Single    Spouse name: Not on file  . Number of children: Not on file  . Years of education: Not on file  . Highest education level: Not on file  Social Needs  . Financial resource strain: Not on file  . Food insecurity - worry: Not on file  . Food insecurity - inability: Not on file  . Transportation needs - medical: Not on file  . Transportation needs - non-medical: Not on file  Occupational History  . Not on file  Tobacco Use  . Smoking status: Former Smoker    Packs/day: 0.50    Types: Cigarettes    Last attempt to quit: 01/2017    Years since quitting: 0.1  . Smokeless tobacco: Never Used  Substance and Sexual Activity  . Alcohol use: No    Frequency: Never  . Drug use: Yes  Types: Marijuana  . Sexual activity: Not on file  Other Topics Concern  . Not on file  Social History Narrative  . Not on file    FAMILY HISTORY: Family History  Problem Relation Age of Onset  . Cancer Other   . Diabetes Other   . Hypertension Other     ALLERGIES:  has No Known Allergies.  MEDICATIONS:  Current Outpatient Medications  Medication Sig Dispense Refill  . folic acid (FOLVITE) 1 MG tablet Take 1 tablet (1 mg total) by mouth daily. 30 tablet 0  . Multiple Vitamins-Minerals (MULTIVITAMIN WITH MINERALS) tablet Take 1 tablet by mouth daily. 120 tablet 2  . predniSONE  (DELTASONE) 5 MG tablet 15mg  po daily x 3 days then 10mg  po daily x 4 days then 5mg  po daily x 7 days then stop 25 tablet 0  . vitamin B-12 (CYANOCOBALAMIN) 1000 MCG tablet Take 1 tablet (1,000 mcg total) by mouth daily. 30 tablet 0   No current facility-administered medications for this visit.     REVIEW OF SYSTEMS:    .10 Point review of Systems was done is negative except as noted above.  PHYSICAL EXAMINATION: ECOG PERFORMANCE STATUS: 0 - Asymptomatic  . Vitals:   04/01/17 0913  BP: 124/77  Pulse: 93  Resp: 18  Temp: 99.2 F (37.3 C)  SpO2: 99%   Filed Weights   04/01/17 0913  Weight: 277 lb 11.2 oz (126 kg)   .Body mass index is 38.73 kg/m.  Marland Kitchen. GENERAL:alert, in no acute distress and comfortable SKIN: no acute rashes, no significant lesions EYES: conjunctiva are pink and non-injected, sclera anicteric OROPHARYNX: MMM, no exudates, no oropharyngeal erythema or ulceration NECK: supple, no JVD LYMPH:  no palpable lymphadenopathy in the cervical, axillary or inguinal regions LUNGS: clear to auscultation b/l with normal respiratory effort HEART: regular rate & rhythm ABDOMEN:  normoactive bowel sounds , non tender, not distended. Extremity: no pedal edema PSYCH: alert & oriented x 3 with fluent speech NEURO: no focal motor/sensory deficits   LABORATORY DATA:  I have reviewed the data as listed  . CBC Latest Ref Rng & Units 03/18/2017 03/08/2017 03/01/2017  WBC 4.0 - 10.3 K/uL 10.4(H) 12.8(H) 11.6(H)  Hemoglobin 13.0 - 17.0 g/dL - - -  Hematocrit 13.038.4 - 49.9 % 43.1 39.0 36.1(L)  Platelets 140 - 400 K/uL 294 274 305  hgb 14.5  . CMP Latest Ref Rng & Units 03/18/2017 03/08/2017 03/01/2017  Glucose 70 - 140 mg/dL 86 865102 784127  BUN 7 - 26 mg/dL 10 10 16   Creatinine 0.70 - 1.30 mg/dL 6.961.17 2.951.12 2.841.09  Sodium 136 - 145 mmol/L 139 140 138  Potassium 3.5 - 5.1 mmol/L 4.0 3.7 4.6  Chloride 98 - 109 mmol/L 104 103 105  CO2 22 - 29 mmol/L 24 27 24   Calcium 8.4 - 10.4 mg/dL 9.9  9.5 9.2  Total Protein 6.4 - 8.3 g/dL 7.5 7.1 6.6  Total Bilirubin 0.2 - 1.2 mg/dL 0.6 0.4 0.3  Alkaline Phos 40 - 150 U/L 76 66 62  AST 5 - 34 U/L 21 16 15   ALT 0 - 55 U/L 21 25 38   . Lab Results  Component Value Date   LDH 283 (H) 04/01/2017      RADIOGRAPHIC STUDIES: I have personally reviewed the radiological images as listed and agreed with the findings in the report. No results found.  ASSESSMENT & PLAN:    27 y.o. 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 On presentation hemoglobin was down to 5.8 with elevated LDH of 1300s. LDH improved to 200's. Hgb up to 15.4 with resolved reticulocytosis -HIV test -non reactive Last plasma exchange done on 02/19/2016 ADAM TS 13 post treatment 48.5 now improved to 76.4  2. 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 Platelets were 16k on initial presentation. Now resolved with Platelets at 262k. With no overt bleeding noted at this time.  3. Left sided mild weakness/numbness - resolved No overt CVA at this time an MRI.    Plan  -No clinical or lab evidence of relapse TTP at this time. hgb and PLT count stable, bilirubin wnl. No new symptoms. -LDH borderline elevated - unclear etiology. - PRBC transfusion prn to maintain Hgb >7 or if symptomatic (has not needed this since plasma exchange has been initiated) -Reiterated that if the pt shows any new symptoms before his next visit, to let us know immediately. -Reiterated that pt to not take any NSAIDs but to take Tylenol for any headaches if the occur. -Continue 5mg  Prednisone for next two days, then stop Prednisone.   RTC with Dr Candise Che in 3 weeks with labs . Earlier if any new concerns  All of the patients questions were answered with apparent satisfaction. The patient knows to call the clinic with any problems, questions or  concerns.  . The total time spent in the appointment was 15 minutes and more than 50% was on counseling and direct patient cares.      Wyvonnia Lora MD MS AAHIVMS [facility] Hematology/Oncology Physician [facility]  (Office):       [phone] (Work cell):  [phone] (Fax):           [phone]  This document serves as a record of services personally performed by Wyvonnia Lora, MD. It was created on his behalf by Marcelline Mates, a trained medical scribe. The creation of this record is based on the scribe's personal observations and the provider's statements to them.   .I have reviewed the above documentation for accuracy and completeness, and I agree with the above. Johney Maine MD MS

## 2017-04-01 ENCOUNTER — Inpatient Hospital Stay: Payer: Managed Care, Other (non HMO) | Attending: Hematology | Admitting: Hematology

## 2017-04-01 ENCOUNTER — Telehealth: Payer: Self-pay | Admitting: Hematology

## 2017-04-01 ENCOUNTER — Inpatient Hospital Stay: Payer: Managed Care, Other (non HMO)

## 2017-04-01 VITALS — BP 124/77 | HR 93 | Temp 99.2°F | Resp 18 | Ht 71.0 in | Wt 277.7 lb

## 2017-04-01 DIAGNOSIS — M3119 Other thrombotic microangiopathy: Secondary | ICD-10-CM

## 2017-04-01 DIAGNOSIS — Z87891 Personal history of nicotine dependence: Secondary | ICD-10-CM | POA: Insufficient documentation

## 2017-04-01 DIAGNOSIS — R740 Nonspecific elevation of levels of transaminase and lactic acid dehydrogenase [LDH]: Secondary | ICD-10-CM | POA: Diagnosis not present

## 2017-04-01 DIAGNOSIS — D649 Anemia, unspecified: Secondary | ICD-10-CM | POA: Insufficient documentation

## 2017-04-01 DIAGNOSIS — D573 Sickle-cell trait: Secondary | ICD-10-CM | POA: Insufficient documentation

## 2017-04-01 DIAGNOSIS — M311 Thrombotic microangiopathy: Secondary | ICD-10-CM | POA: Diagnosis not present

## 2017-04-01 DIAGNOSIS — Z7952 Long term (current) use of systemic steroids: Secondary | ICD-10-CM | POA: Insufficient documentation

## 2017-04-01 DIAGNOSIS — Z79899 Other long term (current) drug therapy: Secondary | ICD-10-CM | POA: Insufficient documentation

## 2017-04-01 DIAGNOSIS — D593 Hemolytic-uremic syndrome: Secondary | ICD-10-CM | POA: Insufficient documentation

## 2017-04-01 DIAGNOSIS — D696 Thrombocytopenia, unspecified: Secondary | ICD-10-CM | POA: Insufficient documentation

## 2017-04-01 LAB — CBC WITH DIFFERENTIAL/PLATELET
BASOS ABS: 0 10*3/uL (ref 0.0–0.1)
BASOS PCT: 0 %
EOS ABS: 0 10*3/uL (ref 0.0–0.5)
EOS PCT: 0 %
HCT: 45.1 % (ref 38.4–49.9)
Hemoglobin: 15.4 g/dL (ref 13.0–17.1)
LYMPHS PCT: 49 %
Lymphs Abs: 3.9 10*3/uL — ABNORMAL HIGH (ref 0.9–3.3)
MCH: 32.2 pg (ref 27.2–33.4)
MCHC: 34.1 g/dL (ref 32.0–36.0)
MCV: 94.4 fL (ref 79.3–98.0)
MONO ABS: 0.4 10*3/uL (ref 0.1–0.9)
Monocytes Relative: 4 %
Neutro Abs: 3.9 10*3/uL (ref 1.5–6.5)
Neutrophils Relative %: 47 %
PLATELETS: 262 10*3/uL (ref 140–400)
RBC: 4.78 MIL/uL (ref 4.20–5.82)
RDW: 14.6 % (ref 11.0–14.6)
WBC: 8.2 10*3/uL (ref 4.0–10.3)

## 2017-04-01 LAB — CMP (CANCER CENTER ONLY)
ALK PHOS: 77 U/L (ref 40–150)
ALT: 19 U/L (ref 0–55)
AST: 22 U/L (ref 5–34)
Albumin: 4.4 g/dL (ref 3.5–5.0)
Anion gap: 8 (ref 3–11)
BILIRUBIN TOTAL: 0.5 mg/dL (ref 0.2–1.2)
BUN: 10 mg/dL (ref 7–26)
CALCIUM: 10 mg/dL (ref 8.4–10.4)
CO2: 28 mmol/L (ref 22–29)
Chloride: 105 mmol/L (ref 98–109)
Creatinine: 1.25 mg/dL (ref 0.70–1.30)
GFR, Est AFR Am: >60 mL/min (ref >60)
GLUCOSE: 88 mg/dL (ref 70–140)
POTASSIUM: 4.1 mmol/L (ref 3.5–5.1)
Sodium: 141 mmol/L (ref 136–145)
TOTAL PROTEIN: 7.4 g/dL (ref 6.4–8.3)

## 2017-04-01 LAB — LACTATE DEHYDROGENASE: LDH: 283 U/L — ABNORMAL HIGH (ref 125–245)

## 2017-04-01 LAB — RETICULOCYTES
RBC.: 4.78 MIL/uL (ref 4.20–5.82)
RETIC COUNT ABSOLUTE: 33.5 10*3/uL — AB (ref 34.8–93.9)
RETIC CT PCT: 0.7 % — AB (ref 0.8–1.8)

## 2017-04-01 NOTE — Telephone Encounter (Signed)
Scheduled appt per 3/7 los - Gave patient aVS and calender per los.  

## 2017-04-15 NOTE — Progress Notes (Signed)
HEMATOLOGY/ONCOLOGY CLINIC NOTE  Date of Service:  04/20/17   Patient Care Team: Patient, No Pcp Per as PCP - General (General Practice)  CHIEF COMPLAINTS  F/u for continued management of TTP   HISTORY OF PRESENTING ILLNESS:  Richard [name] is a wonderful 27 y.o. male who has been referred to us by Fayrene HelperBowie Tran, PA-C in the Emergency Department for evaluation and management of new onset severe anemia and thrombocytopenia.   Patient has received Benadryl and is currently too sleepy to provide much information. Most of the information was obtained from the ED physician and confirmed with the fiance at bedside and key elements were confirmed with patient.   Patient fiancee noted that the patient had recurrent flu-like sx recently and he was treated with amoxacillin.   Prior to, the [facility] and he tested negative for flu and strep and was prescribed Theraflu. Steffanie RainwaterFiancee notes that since then the patient symptoms progressively worsened. Patient was evaluated in the ED with a HA on yesterday and his CT Head showed no abnormalities. Per the Provider in the ED, patient was noted to have an abnormal gait favoring his left leg along with mild decreased sensation and minimal pronator drift. Cleta AlbertsFiancee, Laura also notes that the patient had progressive SOB x [redacted] week along with right sided numbness that she noticed last night along with slurred speech.    INTERVAL HISTORY   Richard [name] is here for a follow up for his TTP currently in remission. He presents to the clinic today noting he is doing well. He [place] He gets Saturdays off. He notes he has taken energy drinks.. He has reduced use to 1-2 times a week. He tries to maintain same sleep schedule everyday.  No change in urine color. No fever/chills/fatigue. Labs todayu show no anemia or thrombocytopenia. Other labs stable. Has been off steroids for  3-4 weeks now.  On review of symptoms, pt denies OTC pain medication other than Tylenol, he denies cough, cold or night sweats, chills or fever. He notes mild abdominal pain likely related to acid reflux. He notes he has some sinus congestion but tolerable.    MEDICAL HISTORY:  Past Medical History:  Diagnosis Date  . Sickle cell trait (HCC)     SURGICAL HISTORY: History reviewed. No pertinent surgical history.  SOCIAL HISTORY: Social History   Socioeconomic History  . Marital status: Single    Spouse name: Not on file  . Number of children: Not on file  . Years of education: Not on file  . Highest education level: Not on file  Occupational History  . Not on file  Social Needs  . Financial resource strain: Not on file  . Food insecurity:    Worry: Not on file    Inability: Not on file  . Transportation needs:    Medical: Not on file    Non-medical: Not on file  Tobacco Use  . Smoking status: Former Smoker    Packs/day: 0.50    Types: Cigarettes    Last attempt to quit: 01/2017    Years since quitting: 0.2  . Smokeless tobacco: Never Used  Substance and Sexual Activity  . Alcohol use: No    Frequency: Never  . Drug use: Yes    Types: Marijuana  . Sexual activity: Not on file  Lifestyle  . Physical activity:    Days per week: Not  on file    Minutes per session: Not on file  . Stress: Not on file  Relationships  . Social connections:    Talks on phone: Not on file    Gets together: Not on file    Attends religious service: Not on file    Active member of club or organization: Not on file    Attends meetings of clubs or organizations: Not on file    Relationship status: Not on file  . Intimate partner violence:    Fear of current or ex partner: Not on file    Emotionally abused: Not on file    Physically abused: Not on file    Forced sexual activity: Not on file  Other Topics Concern  . Not on file  Social History Narrative  . Not on file    FAMILY  HISTORY: Family History  Problem Relation Age of Onset  . Cancer Other   . Diabetes Other   . Hypertension Other     ALLERGIES:  has No Known Allergies.  MEDICATIONS:  Current Outpatient Medications  Medication Sig Dispense Refill  . folic acid (FOLVITE) 1 MG tablet Take 1 tablet (1 mg total) by mouth daily. 30 tablet 0  . Multiple Vitamins-Minerals (MULTIVITAMIN WITH MINERALS) tablet Take 1 tablet by mouth daily. 120 tablet 2  . vitamin B-12 (CYANOCOBALAMIN) 1000 MCG tablet Take 1 tablet (1,000 mcg total) by mouth daily. 30 tablet 0   No current facility-administered medications for this visit.     REVIEW OF SYSTEMS:   .10 Point review of Systems was done is negative except as noted above.   PHYSICAL EXAMINATION: ECOG PERFORMANCE STATUS: 0 - Asymptomatic  Vitals:   04/20/17 1002  BP: 136/84  Pulse: 64  Resp: 18  Temp: 98.6 F (37 C)  SpO2: 99%   Filed Weights   04/20/17 1002  Weight: 279 lb 3.2 oz (126.6 kg)   .Body mass index is 38.94 kg/m. Marland Kitchen GENERAL:alert, in no acute distress and comfortable SKIN: no acute rashes, no significant lesions EYES: conjunctiva are pink and non-injected, sclera anicteric OROPHARYNX: MMM, no exudates, no oropharyngeal erythema or ulceration NECK: supple, no JVD LYMPH:  no palpable lymphadenopathy in the cervical, axillary or inguinal regions LUNGS: clear to auscultation b/l with normal respiratory effort HEART: regular rate & rhythm ABDOMEN:  normoactive bowel sounds , non tender, not distended. Extremity: no pedal edema PSYCH: alert & oriented x 3 with fluent speech NEURO: no focal motor/sensory deficits  LABORATORY DATA:  I have reviewed the data as listed  . CBC Latest Ref Rng & Units 04/20/2017 04/01/2017 03/18/2017  WBC 4.0 - 10.3 K/uL 6.2 8.2 10.4(H)  Hemoglobin 13.0 - 17.1 g/dL - 16.1 -  Hematocrit 09.6 - 49.9 % 41.9 45.1 43.1  Platelets 140 - 400 K/uL 267 262 294  hgb 14.2  . CMP Latest Ref Rng & Units 04/20/2017  04/01/2017 03/18/2017  Glucose 70 - 140 mg/dL 93 88 86  BUN 7 - 26 mg/dL 6(L) 10 10  Creatinine 0.70 - 1.30 mg/dL 0.45 4.09 8.11  Sodium 136 - 145 mmol/L 140 141 139  Potassium 3.5 - 5.1 mmol/L 3.6 4.1 4.0  Chloride 98 - 109 mmol/L 108 105 104  CO2 22 - 29 mmol/L 23 28 24   Calcium 8.4 - 10.4 mg/dL 9.5 91.4 9.9  Total Protein 6.4 - 8.3 g/dL 6.8 7.4 7.5  Total Bilirubin 0.2 - 1.2 mg/dL 0.6 0.5 0.6  Alkaline Phos 40 - 150 U/L 69 77  76  AST 5 - 34 U/L 20 22 21   ALT 0 - 55 U/L 17 19 21    . Lab Results  Component Value Date   LDH 236 04/20/2017     RADIOGRAPHIC STUDIES: I have personally reviewed the radiological images as listed and agreed with the findings in the report. No results found.  ASSESSMENT & PLAN:    27 y.o. 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 On presentation hemoglobin was down to 5.8 with elevated LDH of 1300s. HIV test -non reactive Last plasma exchange done on 02/19/2016 ADAM TS 13 post treatment 48.5 now improved to 76.4 Completed Prednisone in 03/2017. Patient has now been off steroids for 3-4 weeks.  2. S/p Severe Thrombocytopenia - likely from TTP/HUS. Platelets were 16k on initial presentation. Currently resolved with Platelets at 267k.    Plan  -No clinical or lab evidence of relapse TTP at this time. hgb and PLT count stable. No new symptoms. -LDH wnl -Reiterated that if the pt shows any new symptoms before his next visit, to let us know immediately. -Reiterated that pt to not take any NSAIDs but to take Tylenol for any headaches if the occur. -Will space out follow up to about 4 weeks, then every 2 months given stable status.    RTC with Dr Candise Che in 5 weeks with labs   All of the patients questions were answered with apparent satisfaction. The patient knows to call the clinic with any problems,  questions or concerns.  . The total time spent in the appointment was 15 minutes and more than 50% was on counseling and direct patient cares.     Wyvonnia Lora MD MS AAHIVMS [facility] Hematology/Oncology Physician [facility]  (Office):       [phone] (Work cell):  [phone] (Fax):           [phone]  This document serves as a record of services personally performed by Wyvonnia Lora, MD. It was created on his behalf by Delphina Cahill, a trained medical scribe. The creation of this record is based on the scribe's personal observations and the provider's statements to them.    .I have reviewed the above documentation for accuracy and completeness, and I agree with the above. Johney Maine MD MS

## 2017-04-20 ENCOUNTER — Telehealth: Payer: Self-pay | Admitting: Hematology

## 2017-04-20 ENCOUNTER — Inpatient Hospital Stay (HOSPITAL_BASED_OUTPATIENT_CLINIC_OR_DEPARTMENT_OTHER): Payer: Managed Care, Other (non HMO) | Admitting: Hematology

## 2017-04-20 ENCOUNTER — Encounter: Payer: Self-pay | Admitting: Hematology

## 2017-04-20 ENCOUNTER — Inpatient Hospital Stay: Payer: Managed Care, Other (non HMO)

## 2017-04-20 VITALS — BP 136/84 | HR 64 | Temp 98.6°F | Resp 18 | Ht 71.0 in | Wt 279.2 lb

## 2017-04-20 DIAGNOSIS — D573 Sickle-cell trait: Secondary | ICD-10-CM

## 2017-04-20 DIAGNOSIS — D593 Hemolytic-uremic syndrome: Secondary | ICD-10-CM | POA: Diagnosis not present

## 2017-04-20 DIAGNOSIS — D649 Anemia, unspecified: Secondary | ICD-10-CM | POA: Diagnosis not present

## 2017-04-20 DIAGNOSIS — Z79899 Other long term (current) drug therapy: Secondary | ICD-10-CM | POA: Diagnosis not present

## 2017-04-20 DIAGNOSIS — Z7952 Long term (current) use of systemic steroids: Secondary | ICD-10-CM | POA: Diagnosis not present

## 2017-04-20 DIAGNOSIS — D696 Thrombocytopenia, unspecified: Secondary | ICD-10-CM | POA: Diagnosis not present

## 2017-04-20 DIAGNOSIS — M311 Thrombotic microangiopathy: Secondary | ICD-10-CM

## 2017-04-20 DIAGNOSIS — R740 Nonspecific elevation of levels of transaminase and lactic acid dehydrogenase [LDH]: Secondary | ICD-10-CM

## 2017-04-20 DIAGNOSIS — Z87891 Personal history of nicotine dependence: Secondary | ICD-10-CM

## 2017-04-20 DIAGNOSIS — M3119 Other thrombotic microangiopathy: Secondary | ICD-10-CM

## 2017-04-20 LAB — CBC WITH DIFFERENTIAL (CANCER CENTER ONLY)
BASOS PCT: 1 %
Basophils Absolute: 0 10*3/uL (ref 0.0–0.1)
EOS ABS: 0 10*3/uL (ref 0.0–0.5)
EOS PCT: 1 %
HCT: 41.9 % (ref 38.4–49.9)
Hemoglobin: 14.2 g/dL (ref 13.0–17.1)
Lymphocytes Relative: 54 %
Lymphs Abs: 3.4 10*3/uL — ABNORMAL HIGH (ref 0.9–3.3)
MCH: 31.3 pg (ref 27.2–33.4)
MCHC: 33.9 g/dL (ref 32.0–36.0)
MCV: 92.3 fL (ref 79.3–98.0)
Monocytes Absolute: 0.4 10*3/uL (ref 0.1–0.9)
Monocytes Relative: 7 %
Neutro Abs: 2.3 10*3/uL (ref 1.5–6.5)
Neutrophils Relative %: 37 %
PLATELETS: 267 10*3/uL (ref 140–400)
RBC: 4.54 MIL/uL (ref 4.20–5.82)
RDW: 14.1 % (ref 11.0–14.6)
WBC: 6.2 10*3/uL (ref 4.0–10.3)

## 2017-04-20 LAB — CMP (CANCER CENTER ONLY)
ALK PHOS: 69 U/L (ref 40–150)
ALT: 17 U/L (ref 0–55)
AST: 20 U/L (ref 5–34)
Albumin: 4.2 g/dL (ref 3.5–5.0)
Anion gap: 9 (ref 3–11)
BILIRUBIN TOTAL: 0.6 mg/dL (ref 0.2–1.2)
BUN: 6 mg/dL — ABNORMAL LOW (ref 7–26)
CALCIUM: 9.5 mg/dL (ref 8.4–10.4)
CO2: 23 mmol/L (ref 22–29)
CREATININE: 1.03 mg/dL (ref 0.70–1.30)
Chloride: 108 mmol/L (ref 98–109)
Glucose, Bld: 93 mg/dL (ref 70–140)
Potassium: 3.6 mmol/L (ref 3.5–5.1)
Sodium: 140 mmol/L (ref 136–145)
TOTAL PROTEIN: 6.8 g/dL (ref 6.4–8.3)

## 2017-04-20 LAB — RETICULOCYTES
RBC.: 4.54 MIL/uL (ref 4.20–5.82)
RETIC CT PCT: 1.1 % (ref 0.8–1.8)
Retic Count, Absolute: 49.9 10*3/uL (ref 34.8–93.9)

## 2017-04-20 LAB — LACTATE DEHYDROGENASE: LDH: 236 U/L (ref 125–245)

## 2017-04-20 NOTE — Telephone Encounter (Signed)
Appt scheduled per 3/26 los

## 2017-05-28 ENCOUNTER — Inpatient Hospital Stay: Payer: Managed Care, Other (non HMO)

## 2017-05-28 ENCOUNTER — Inpatient Hospital Stay: Payer: Managed Care, Other (non HMO) | Admitting: Hematology

## 2017-05-28 ENCOUNTER — Telehealth: Payer: Self-pay | Admitting: Hematology

## 2017-05-28 NOTE — Telephone Encounter (Signed)
Patient called to reschedule  °

## 2017-06-04 ENCOUNTER — Inpatient Hospital Stay: Payer: Managed Care, Other (non HMO) | Admitting: Hematology

## 2017-06-04 ENCOUNTER — Inpatient Hospital Stay: Payer: Managed Care, Other (non HMO)

## 2017-06-10 NOTE — Progress Notes (Signed)
HEMATOLOGY/ONCOLOGY CLINIC NOTE  Date of Service:  06/11/17   Patient Care Team: Patient, No Pcp Per as PCP - General (General Practice)  CHIEF COMPLAINTS  F/u for  TTP   HISTORY OF PRESENTING ILLNESS:  Richard [name] is a wonderful 27 y.o. male who has been referred to [place] by Fayrene Helper, PA-C in the Emergency Department for evaluation and management of new onset severe anemia and thrombocytopenia.   Patient has received Benadryl and is currently too sleepy to provide much information. Most of the information was obtained from the ED physician and confirmed with the fiance at bedside and key elements were confirmed with patient.   Patient fiancee noted that the patient had recurrent flu-like sx recently and he was treated with amoxacillin.   Prior to, the patient was evaluated at [facility] 2 weeks ago for flu-like symptoms and he tested negative for flu and strep and was prescribed Theraflu. [name] notes that since then the patient symptoms progressively worsened. Patient was evaluated in the ED with a HA on yesterday and his CT Head showed no abnormalities. Per the Provider in the ED, patient was noted to have an abnormal gait favoring his left leg along with mild decreased sensation and minimal pronator drift. [name] also notes that the patient had progressive SOB x [redacted] week along with right sided numbness that she noticed last night along with slurred speech.    INTERVAL HISTORY   Richard [name] is here for a follow up for his TTP . He presents to the clinic today noting he is doing well. He is accompanied by his companion today. He hasn't been taken a B-complex vitamin recently, but he has been taking OTC vitamin supplements.   Labs today (06/11/17) of CBC, CMP, LDH, and Reticulocytes is as follows: all values are WNL except for reticulocytes at retic ct pct at 2.1 and retic count absolute at 96.2. CBC shows lymphs abs at 3.4. CMP and LDH .193  On review of systems, he reports  generalized fatigue, and petechia. He notes that he doesn't have issues with sleep, however, it takes him awhile to fall asleep. [place]. he denies insomnia, night sweats, fever, chills, and any other symptoms. Pertinent positives are listed and detailed within the above HPI.   MEDICAL HISTORY:  Past Medical History:  Diagnosis Date  . Sickle cell trait (HCC)     SURGICAL HISTORY: History reviewed. No pertinent surgical history.  SOCIAL HISTORY: Social History   Socioeconomic History  . Marital status: Single    Spouse name: Not on file  . Number of children: Not on file  . Years of education: Not on file  . Highest education level: Not on file  Occupational History  . Not on file  Social Needs  . Financial resource strain: Not on file  . Food insecurity:    Worry: Not on file    Inability: Not on file  . Transportation needs:    Medical: Not on file    Non-medical: Not on file  Tobacco Use  . Smoking status: Former Smoker    Packs/day: 0.50    Types: Cigarettes    Last attempt to quit: 01/2017    Years since quitting: 0.3  . Smokeless tobacco: Never Used  Substance and Sexual Activity  . Alcohol use: No    Frequency: Never  . Drug use: Yes    Types: Marijuana  . Sexual activity: Not on file  Lifestyle  . Physical activity:    Days per week: Not on file    Minutes per session: Not on file  . Stress: Not on file  Relationships  . Social connections:    Talks on phone: Not on file    Gets together: Not on file    Attends religious service: Not on file    Active member of club or organization: Not on file    Attends meetings of clubs or organizations: Not on file    Relationship status: Not on file  . Intimate partner violence:    Fear of current or ex partner: Not on file    Emotionally abused: Not on file    Physically abused: Not on file    Forced sexual activity: Not on file  Other Topics Concern  . Not on file    Social History Narrative  . Not on file    FAMILY HISTORY: Family History  Problem Relation Age of Onset  . Cancer Other   . Diabetes Other   . Hypertension Other     ALLERGIES:  has No Known Allergies.  MEDICATIONS:  Current Outpatient Medications  Medication Sig Dispense Refill  . Multiple Vitamins-Minerals (MULTIVITAMIN ADULT PO) Take 1 tablet by mouth daily.     No current facility-administered medications for this visit.     REVIEW OF SYSTEMS:   A 10+ POINT REVIEW OF SYSTEMS WAS OBTAINED including neurology, dermatology, psychiatry, cardiac, respiratory, lymph, extremities, GI, GU, Musculoskeletal, constitutional, breasts, reproductive, HEENT.  All pertinent positives are noted in the HPI.  All others are negative.  PHYSICAL EXAMINATION:  ECOG PERFORMANCE STATUS: 0 - Asymptomatic  Vitals:   06/11/17 1039  BP: 124/72  Pulse: 88  Resp: 18  Temp: 98.6 F (37 C)  SpO2: 99%   Filed Weights   06/11/17 1039  Weight: 265 lb 6.4 oz (120.4 kg)   .Body mass index is 37.02 kg/m. Marland Kitchen  GENERAL:alert, in no acute distress and comfortable SKIN: no acute rashes, no significant lesions EYES: conjunctiva are pink and non-injected, sclera anicteric OROPHARYNX: MMM, no exudates, no oropharyngeal erythema or ulceration NECK: supple, no JVD LYMPH:  no palpable lymphadenopathy in the cervical, axillary or inguinal regions LUNGS: clear to auscultation b/l with normal respiratory effort HEART: regular rate & rhythm ABDOMEN:  normoactive bowel sounds , non tender, not distended. Extremity: no pedal edema PSYCH: alert & oriented x 3 with fluent speech NEURO: no focal motor/sensory deficits  LABORATORY DATA:  I have reviewed the data as listed  . CBC Latest Ref Rng & Units 06/11/2017 04/20/2017 04/01/2017  WBC 4.0 - 10.3 K/uL 6.9 6.2 8.2  Hemoglobin 13.0 - 17.1 g/dL 16.1 09.6 04.5  Hematocrit 38.4 - 49.9 % 40.8 41.9 45.1  Platelets 140 - 400 K/uL 261 267 262    . CMP Latest  Ref Rng & Units 06/11/2017 04/20/2017 04/01/2017  Glucose 70 - 140 mg/dL 95 93 88  BUN 7 - 26 mg/dL 12 6(L) 10  Creatinine 0.70 - 1.30 mg/dL 4.09 8.11 9.14  Sodium 136 - 145 mmol/L 139 140 141  Potassium 3.5 - 5.1 mmol/L 3.7 3.6 4.1  Chloride 98 - 109 mmol/L 107 108 105  CO2 22 - 29 mmol/L Calcium 8.4 - 10.4 mg/dL 9.4 9.5 78.2  Total Protein 6.4 - 8.3 g/dL 7.2 6.8 7.4  Total Bilirubin 0.2 - 1.2 mg/dL 1.0 0.6 0.5  Alkaline Phos 40 - 150 U/L 72 69 77  AST 5 -  34 U/L ALT 0 - 55 U/L . Lab Results  Component Value Date   LDH 193 06/11/2017     RADIOGRAPHIC STUDIES: I have personally reviewed the radiological images as listed and agreed with the findings in the report. No results found.  ASSESSMENT & PLAN:   27 y.o. 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 On presentation hemoglobin was down to 5.8 with elevated LDH of 1300s. HIV test -non reactive Last plasma exchange done on 02/19/2016 ADAM TS 13 post treatment 48.5 now improved to 76.4 Completed Prednisone in 03/2017. Patient has now been off steroids for 3-4 weeks. PLAN -Labs today (06/11/17) of CBC, CMP, LDH, and Reticulocytes is as follows: all values are WNL except for reticulocytes at retic ct pct at 2.1 and retic count absolute at 96.2. CBC shows lymphs abs at 3.4. CMP and LDH wcnl -Labs discussed with the patient today.  -No clinical or lab evidence of TTP relapse at this time. -I advised the patient to continue taking multivitamin supplements -RTC with Dr Candise Che in 2 months with labs  2. S/p Severe Thrombocytopenia - likely from TTP/HUS. Platelets were 16k on initial presentation. Currently resolved. -PLT at 261K on 06/11/2017   Plan  -No clinical or lab evidence of relapse TTP at this time. hgb and PLT count stable. No new  symptoms. -Reiterated that if the pt shows any new symptoms before his next visit, to let us know immediately. -Reiterated that pt to not take any NSAIDs but to take Tylenol for any headaches if the occur.  -RTC with Dr Candise Che in 2 months with labs    All of the patients questions were answered with apparent satisfaction. The patient knows to call the clinic with any problems, questions or concerns.  . The total time spent in the appointment was 15 minutes and more than 50% was on counseling and direct patient cares.  Wyvonnia Lora MD MS AAHIVMS [facility] Hematology/Oncology Physician [facility]  (Office):       [phone] (Work cell):  [phone] (Fax):           [phone]  This document serves as a record of services personally performed by Wyvonnia Lora, MD. It was created on his behalf by Chestine Spore, a trained medical scribe. The creation of this record is based on the scribe's personal observations and the provider's statements to them.   .I have reviewed the above documentation for accuracy and completeness, and I agree with the above. Johney Maine MD

## 2017-06-11 ENCOUNTER — Inpatient Hospital Stay: Payer: Managed Care, Other (non HMO) | Attending: Hematology

## 2017-06-11 ENCOUNTER — Telehealth: Payer: Self-pay

## 2017-06-11 ENCOUNTER — Inpatient Hospital Stay (HOSPITAL_BASED_OUTPATIENT_CLINIC_OR_DEPARTMENT_OTHER): Payer: Managed Care, Other (non HMO) | Admitting: Hematology

## 2017-06-11 ENCOUNTER — Encounter: Payer: Self-pay | Admitting: Hematology

## 2017-06-11 VITALS — BP 124/72 | HR 88 | Temp 98.6°F | Resp 18 | Ht 71.0 in | Wt 265.4 lb

## 2017-06-11 DIAGNOSIS — D696 Thrombocytopenia, unspecified: Secondary | ICD-10-CM | POA: Diagnosis not present

## 2017-06-11 DIAGNOSIS — M311 Thrombotic microangiopathy: Secondary | ICD-10-CM

## 2017-06-11 DIAGNOSIS — D573 Sickle-cell trait: Secondary | ICD-10-CM | POA: Insufficient documentation

## 2017-06-11 DIAGNOSIS — Z87891 Personal history of nicotine dependence: Secondary | ICD-10-CM | POA: Diagnosis not present

## 2017-06-11 DIAGNOSIS — R233 Spontaneous ecchymoses: Secondary | ICD-10-CM | POA: Diagnosis not present

## 2017-06-11 DIAGNOSIS — Z79899 Other long term (current) drug therapy: Secondary | ICD-10-CM

## 2017-06-11 DIAGNOSIS — R51 Headache: Secondary | ICD-10-CM | POA: Insufficient documentation

## 2017-06-11 DIAGNOSIS — M3119 Other thrombotic microangiopathy: Secondary | ICD-10-CM

## 2017-06-11 DIAGNOSIS — R5383 Other fatigue: Secondary | ICD-10-CM | POA: Diagnosis not present

## 2017-06-11 LAB — CBC WITH DIFFERENTIAL (CANCER CENTER ONLY)
BASOS ABS: 0 10*3/uL (ref 0.0–0.1)
BASOS PCT: 1 %
Eosinophils Absolute: 0 10*3/uL (ref 0.0–0.5)
Eosinophils Relative: 0 %
HEMATOCRIT: 40.8 % (ref 38.4–49.9)
HEMOGLOBIN: 13.8 g/dL (ref 13.0–17.1)
Lymphocytes Relative: 49 %
Lymphs Abs: 3.4 10*3/uL — ABNORMAL HIGH (ref 0.9–3.3)
MCH: 30.1 pg (ref 27.2–33.4)
MCHC: 33.8 g/dL (ref 32.0–36.0)
MCV: 89.1 fL (ref 79.3–98.0)
MONOS PCT: 7 %
Monocytes Absolute: 0.5 10*3/uL (ref 0.1–0.9)
NEUTROS ABS: 2.9 10*3/uL (ref 1.5–6.5)
Neutrophils Relative %: 43 %
Platelet Count: 261 10*3/uL (ref 140–400)
RBC: 4.58 MIL/uL (ref 4.20–5.82)
RDW: 13.2 % (ref 11.0–14.6)
WBC Count: 6.9 10*3/uL (ref 4.0–10.3)

## 2017-06-11 LAB — CMP (CANCER CENTER ONLY)
ALK PHOS: 72 U/L (ref 40–150)
ALT: 21 U/L (ref 0–55)
AST: 26 U/L (ref 5–34)
Albumin: 4.6 g/dL (ref 3.5–5.0)
Anion gap: 9 (ref 3–11)
BILIRUBIN TOTAL: 1 mg/dL (ref 0.2–1.2)
BUN: 12 mg/dL (ref 7–26)
CO2: 23 mmol/L (ref 22–29)
Calcium: 9.4 mg/dL (ref 8.4–10.4)
Chloride: 107 mmol/L (ref 98–109)
Creatinine: 1.14 mg/dL (ref 0.70–1.30)
GFR, Estimated: >60 mL/min (ref >60)
Glucose, Bld: 95 mg/dL (ref 70–140)
Potassium: 3.7 mmol/L (ref 3.5–5.1)
Sodium: 139 mmol/L (ref 136–145)
TOTAL PROTEIN: 7.2 g/dL (ref 6.4–8.3)

## 2017-06-11 LAB — RETICULOCYTES
RBC.: 4.58 MIL/uL (ref 4.20–5.82)
RETIC CT PCT: 2.1 % — AB (ref 0.8–1.8)
Retic Count, Absolute: 96.2 10*3/uL — ABNORMAL HIGH (ref 34.8–93.9)

## 2017-06-11 LAB — LACTATE DEHYDROGENASE: LDH: 193 U/L (ref 125–245)

## 2017-06-11 NOTE — Telephone Encounter (Signed)
Printed avs and calender of upcoming appointment. Per 5/17 los 

## 2017-08-06 ENCOUNTER — Inpatient Hospital Stay: Payer: Managed Care, Other (non HMO)

## 2017-08-06 ENCOUNTER — Inpatient Hospital Stay: Payer: Managed Care, Other (non HMO) | Admitting: Hematology

## 2017-08-26 NOTE — Progress Notes (Signed)
HEMATOLOGY/ONCOLOGY CLINIC NOTE  Date of Service:  08/27/17   Patient Care Team: Patient, No Pcp Per as PCP - General (General Practice)  CHIEF COMPLAINTS  F/u for  TTP   HISTORY OF PRESENTING ILLNESS:  Richard [name] is a wonderful 27 y.o. male who has been referred to [place] by Fayrene Helper, PA-C in the Emergency Department for evaluation and management of new onset severe anemia and thrombocytopenia.   Patient has received Benadryl and is currently too sleepy to provide much information. Most of the information was obtained from the ED physician and confirmed with the fiance at bedside and key elements were confirmed with patient.   Patient fiancee noted that the patient had recurrent flu-like sx recently and he was treated with amoxacillin.   Prior to, the patient was evaluated at [facility] 2 weeks ago for flu-like symptoms and he tested negative for flu and strep and was prescribed Theraflu. [name] notes that since then the patient symptoms progressively worsened. Patient was evaluated in the ED with a HA on yesterday and his CT Head showed no abnormalities. Per the Provider in the ED, patient was noted to have an abnormal gait favoring his left leg along with mild decreased sensation and minimal pronator drift. [name] also notes that the patient had progressive SOB x [redacted] week along with right sided numbness that she noticed last night along with slurred speech.    INTERVAL HISTORY   Richard [name] is here for a follow up for his TTP . The patient's last visit with [place] was on 06/11/17.  The pt reports that he has been doing very well and has had no new concerns. He continues to stay active and is not physically limited in any way. He denies any recent infections or constitutional symptoms.   Lab results today (08/27/17) of CBC w/diff, CMP, and Reticulocytes is as follows: all values are WNL except for Lymphs abs at 4.0, Retic ct pct at 2.1%, Retic ct abs at 100.6k.  On review of  systems, pt reports good energy levels, and denies abdominal pains, recent infections, leg swelling, weakness, fevers, chills, night sweats, and any other symptoms.    MEDICAL HISTORY:  Past Medical History:  Diagnosis Date   Sickle cell trait (HCC)     SURGICAL HISTORY: History reviewed. No pertinent surgical history.  SOCIAL HISTORY: Social History   Socioeconomic History   Marital status: Single    Spouse name: Not on file   Number of children: Not on file   Years of education: Not on file   Highest education level: Not on file  Occupational History   Not on file  Social Needs   Financial resource strain: Not on file   Food insecurity:    Worry: Not on file    Inability: Not on file   Transportation needs:    Medical: Not on file    Non-medical: Not on file  Tobacco Use   Smoking status: Former Smoker    Packs/day: 0.50    Types: Cigarettes    Last attempt to quit: 01/2017    Years since quitting: 0.5   Smokeless tobacco: Never Used  Substance and Sexual Activity   Alcohol use: No    Frequency: Never   Drug use: Yes    Types: Marijuana   Sexual activity: Not on file  Lifestyle   Physical activity:    Days per week: Not on file    Minutes per session: Not on file  Stress: Not on file  Relationships   Social connections:    Talks on phone: Not on file    Gets together: Not on file    Attends religious service: Not on file    Active member of club or organization: Not on file    Attends meetings of clubs or organizations: Not on file    Relationship status: Not on file   Intimate partner violence:    Fear of current or ex partner: Not on file    Emotionally abused: Not on file    Physically abused: Not on file    Forced sexual activity: Not on file  Other Topics Concern   Not on file  Social History Narrative   Not on file    FAMILY HISTORY: Family History  Problem Relation Age of Onset   Cancer Other    Diabetes Other      Hypertension Other     ALLERGIES:  has No Known Allergies.  MEDICATIONS:  Current Outpatient Medications  Medication Sig Dispense Refill   Multiple Vitamins-Minerals (MULTIVITAMIN ADULT PO) Take 1 tablet by mouth daily.     No current facility-administered medications for this visit.     REVIEW OF SYSTEMS:   A 10+ POINT REVIEW OF SYSTEMS WAS OBTAINED including neurology, dermatology, psychiatry, cardiac, respiratory, lymph, extremities, GI, GU, Musculoskeletal, constitutional, breasts, reproductive, HEENT.  All pertinent positives are noted in the HPI.  All others are negative.   PHYSICAL EXAMINATION:  ECOG PERFORMANCE STATUS: 0 - Asymptomatic  Vitals:   08/27/17 1017  BP: 131/72  Pulse: 78  Resp: 18  Temp: 98 F (36.7 C)  SpO2: 100%   Filed Weights   08/27/17 1017  Weight: 264 lb 14.4 oz (120.2 kg)   .Body mass index is 36.95 kg/m. Marland Kitchen.  GENERAL:alert, in no acute distress and comfortable SKIN: no acute rashes, no significant lesions EYES: conjunctiva are pink and non-injected, sclera anicteric OROPHARYNX: MMM, no exudates, no oropharyngeal erythema or ulceration NECK: supple, no JVD LYMPH:  no palpable lymphadenopathy in the cervical, axillary or inguinal regions LUNGS: clear to auscultation b/l with normal respiratory effort HEART: regular rate & rhythm ABDOMEN:  normoactive bowel sounds , non tender, not distended. No palpable hepatosplenomegaly.  Extremity: no pedal edema PSYCH: alert & oriented x 3 with fluent speech NEURO: no focal motor/sensory deficits   LABORATORY DATA:  I have reviewed the data as listed  . CBC Latest Ref Rng & Units 08/27/2017 06/11/2017 04/20/2017  WBC 4.0 - 10.3 K/uL 8.2 6.9 6.2  Hemoglobin 13.0 - 17.1 g/dL 96.015.1 45.413.8 09.814.2  Hematocrit 38.4 - 49.9 % 43.5 40.8 41.9  Platelets 140 - 400 K/uL 287 261 267    . CMP Latest Ref Rng & Units 08/27/2017 06/11/2017 04/20/2017  Glucose 70 - 99 mg/dL 84 95 93  BUN 6 - 20 mg/dL 13 12 6(L)   Creatinine 0.61 - 1.24 mg/dL 1.191.21 1.471.14 8.291.03  Sodium 135 - 145 mmol/L 141 139 140  Potassium 3.5 - 5.1 mmol/L 4.0 3.7 3.6  Chloride 98 - 111 mmol/L 104 107 108  CO2 22 - 32 mmol/L 26 23 23   Calcium 8.9 - 10.3 mg/dL 9.8 9.4 9.5  Total Protein 6.5 - 8.1 g/dL 7.9 7.2 6.8  Total Bilirubin 0.3 - 1.2 mg/dL 1.0 1.0 0.6  Alkaline Phos 38 - 126 U/L 80 72 69  AST 15 - 41 U/L 25 26 20   ALT 0 - 44 U/L 23 21 17    . Lab Results  Component Value Date   LDH 193 06/11/2017     RADIOGRAPHIC STUDIES: I have personally reviewed the radiological images as listed and agreed with the findings in the report. No results found.  ASSESSMENT & PLAN:   27 y.o. 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 On presentation hemoglobin was down to 5.8 with elevated LDH of 1300s. HIV test -non reactive Last plasma exchange done on 02/19/2016 ADAM TS 13 post treatment 48.5 now improved to 76.4 Completed Prednisone in 03/2017. Patient has now been off steroids since then. PLAN -No clinical or lab evidence of TTP relapse at this time. -I advised the patient to continue taking multivitamin supplements  2. S/p Severe Thrombocytopenia - likely from TTP/HUS. Platelets were 16k on initial presentation. Currently resolved. -PLT at 287k today 08/27/2017   Plan  -No clinical or lab evidence of relapse of TTP at this time. hgb and PLT count stable. No new symptoms. -Reiterated that if the pt shows any new symptoms before his next visit, to let us know immediately. -Reiterated that pt to not take any NSAIDs but to take Tylenol for any headaches if the occur. -Discussed pt labwork today, 08/27/17; blood counts are stable, including PLT at 287k, blood chemistries are normal -Will see pt back in 4 months -Recommended again that the pt establish care with a PCP    RTC with  Dr Candise Che in 4 months with labs   All of the patients questions were answered with apparent satisfaction. The patient knows to call the clinic with any problems, questions or concerns.  The total time spent in the appt was 20 minutes and more than 50% was on counseling and direct patient cares.  Wyvonnia Lora MD MS AAHIVMS [facility] Hematology/Oncology Physician [facility]  (Office):       [phone] (Work cell):  [phone] (Fax):           [phone]  I, Marcelline Mates, am acting as a scribe for Dr. Candise Che  .I have reviewed the above documentation for accuracy and completeness, and I agree with the above. Johney Maine MD

## 2017-08-27 ENCOUNTER — Telehealth: Payer: Self-pay

## 2017-08-27 ENCOUNTER — Encounter: Payer: Self-pay | Admitting: Hematology

## 2017-08-27 ENCOUNTER — Inpatient Hospital Stay: Payer: Managed Care, Other (non HMO) | Admitting: Hematology

## 2017-08-27 ENCOUNTER — Inpatient Hospital Stay: Payer: Managed Care, Other (non HMO) | Attending: Hematology

## 2017-08-27 VITALS — BP 131/72 | HR 78 | Temp 98.0°F | Resp 18 | Ht 71.0 in | Wt 264.9 lb

## 2017-08-27 DIAGNOSIS — D573 Sickle-cell trait: Secondary | ICD-10-CM | POA: Insufficient documentation

## 2017-08-27 DIAGNOSIS — M3119 Other thrombotic microangiopathy: Secondary | ICD-10-CM

## 2017-08-27 DIAGNOSIS — M311 Thrombotic microangiopathy: Secondary | ICD-10-CM | POA: Insufficient documentation

## 2017-08-27 DIAGNOSIS — Z87891 Personal history of nicotine dependence: Secondary | ICD-10-CM

## 2017-08-27 LAB — CBC WITH DIFFERENTIAL/PLATELET
BASOS PCT: 0 %
Basophils Absolute: 0 10*3/uL (ref 0.0–0.1)
EOS ABS: 0.1 10*3/uL (ref 0.0–0.5)
Eosinophils Relative: 1 %
HEMATOCRIT: 43.5 % (ref 38.4–49.9)
HEMOGLOBIN: 15.1 g/dL (ref 13.0–17.1)
Lymphocytes Relative: 48 %
Lymphs Abs: 4 10*3/uL — ABNORMAL HIGH (ref 0.9–3.3)
MCH: 31.5 pg (ref 27.2–33.4)
MCHC: 34.7 g/dL (ref 32.0–36.0)
MCV: 90.8 fL (ref 79.3–98.0)
MONOS PCT: 7 %
Monocytes Absolute: 0.5 10*3/uL (ref 0.1–0.9)
NEUTROS ABS: 3.6 10*3/uL (ref 1.5–6.5)
NEUTROS PCT: 44 %
PLATELETS: 287 10*3/uL (ref 140–400)
RBC: 4.79 MIL/uL (ref 4.20–5.82)
RDW: 12.5 % (ref 11.0–14.6)
WBC: 8.2 10*3/uL (ref 4.0–10.3)

## 2017-08-27 LAB — CMP (CANCER CENTER ONLY)
ALT: 23 U/L (ref 0–44)
AST: 25 U/L (ref 15–41)
Albumin: 4.8 g/dL (ref 3.5–5.0)
Alkaline Phosphatase: 80 U/L (ref 38–126)
Anion gap: 11 (ref 5–15)
BUN: 13 mg/dL (ref 6–20)
CHLORIDE: 104 mmol/L (ref 98–111)
CO2: 26 mmol/L (ref 22–32)
CREATININE: 1.21 mg/dL (ref 0.61–1.24)
Calcium: 9.8 mg/dL (ref 8.9–10.3)
Glucose, Bld: 84 mg/dL (ref 70–99)
POTASSIUM: 4 mmol/L (ref 3.5–5.1)
SODIUM: 141 mmol/L (ref 135–145)
Total Bilirubin: 1 mg/dL (ref 0.3–1.2)
Total Protein: 7.9 g/dL (ref 6.5–8.1)

## 2017-08-27 LAB — RETICULOCYTES
RBC.: 4.79 MIL/uL (ref 4.20–5.82)
RETIC CT PCT: 2.1 % — AB (ref 0.8–1.8)
Retic Count, Absolute: 100.6 10*3/uL — ABNORMAL HIGH (ref 34.8–93.9)

## 2017-08-27 NOTE — Telephone Encounter (Signed)
PRINTED AVS AND CALENDER FO UPCOMING APPOINTMENT. PER 8/2 LOS

## 2017-12-24 ENCOUNTER — Inpatient Hospital Stay (HOSPITAL_BASED_OUTPATIENT_CLINIC_OR_DEPARTMENT_OTHER): Payer: Managed Care, Other (non HMO) | Admitting: Hematology

## 2017-12-24 ENCOUNTER — Inpatient Hospital Stay: Payer: Managed Care, Other (non HMO) | Attending: Hematology

## 2017-12-24 ENCOUNTER — Telehealth: Payer: Self-pay

## 2017-12-24 VITALS — BP 130/76 | HR 89 | Temp 98.2°F | Resp 18 | Ht 71.0 in | Wt 262.2 lb

## 2017-12-24 DIAGNOSIS — M311 Thrombotic microangiopathy: Secondary | ICD-10-CM | POA: Insufficient documentation

## 2017-12-24 DIAGNOSIS — M3119 Other thrombotic microangiopathy: Secondary | ICD-10-CM

## 2017-12-24 DIAGNOSIS — Z87891 Personal history of nicotine dependence: Secondary | ICD-10-CM | POA: Diagnosis not present

## 2017-12-24 DIAGNOSIS — R0602 Shortness of breath: Secondary | ICD-10-CM | POA: Insufficient documentation

## 2017-12-24 DIAGNOSIS — R4781 Slurred speech: Secondary | ICD-10-CM

## 2017-12-24 DIAGNOSIS — R200 Anesthesia of skin: Secondary | ICD-10-CM | POA: Diagnosis not present

## 2017-12-24 DIAGNOSIS — Z79899 Other long term (current) drug therapy: Secondary | ICD-10-CM | POA: Diagnosis not present

## 2017-12-24 DIAGNOSIS — F1210 Cannabis abuse, uncomplicated: Secondary | ICD-10-CM | POA: Diagnosis not present

## 2017-12-24 DIAGNOSIS — D593 Hemolytic-uremic syndrome: Secondary | ICD-10-CM

## 2017-12-24 DIAGNOSIS — D573 Sickle-cell trait: Secondary | ICD-10-CM | POA: Insufficient documentation

## 2017-12-24 LAB — CBC WITH DIFFERENTIAL/PLATELET
Abs Immature Granulocytes: 0.01 10*3/uL (ref 0.00–0.07)
Basophils Absolute: 0 10*3/uL (ref 0.0–0.1)
Basophils Relative: 0 %
EOS PCT: 1 %
Eosinophils Absolute: 0.1 10*3/uL (ref 0.0–0.5)
HCT: 43.6 % (ref 39.0–52.0)
HEMOGLOBIN: 14.7 g/dL (ref 13.0–17.0)
Immature Granulocytes: 0 %
Lymphocytes Relative: 52 %
Lymphs Abs: 4.1 10*3/uL — ABNORMAL HIGH (ref 0.7–4.0)
MCH: 30.7 pg (ref 26.0–34.0)
MCHC: 33.7 g/dL (ref 30.0–36.0)
MCV: 91 fL (ref 80.0–100.0)
MONO ABS: 0.5 10*3/uL (ref 0.1–1.0)
Monocytes Relative: 7 %
NRBC: 0 % (ref 0.0–0.2)
Neutro Abs: 3.2 10*3/uL (ref 1.7–7.7)
Neutrophils Relative %: 40 %
Platelets: 274 10*3/uL (ref 150–400)
RBC: 4.79 MIL/uL (ref 4.22–5.81)
RDW: 12.2 % (ref 11.5–15.5)
WBC: 7.9 10*3/uL (ref 4.0–10.5)

## 2017-12-24 LAB — CMP (CANCER CENTER ONLY)
ALK PHOS: 77 U/L (ref 38–126)
ALT: 20 U/L (ref 0–44)
AST: 24 U/L (ref 15–41)
Albumin: 4.6 g/dL (ref 3.5–5.0)
Anion gap: 9 (ref 5–15)
BUN: 13 mg/dL (ref 6–20)
CALCIUM: 9.8 mg/dL (ref 8.9–10.3)
CO2: 26 mmol/L (ref 22–32)
CREATININE: 1.13 mg/dL (ref 0.61–1.24)
Chloride: 106 mmol/L (ref 98–111)
GFR, Estimated: >60 mL/min (ref >60)
Glucose, Bld: 88 mg/dL (ref 70–99)
Potassium: 4.2 mmol/L (ref 3.5–5.1)
Sodium: 141 mmol/L (ref 135–145)
Total Bilirubin: 0.6 mg/dL (ref 0.3–1.2)
Total Protein: 7.5 g/dL (ref 6.5–8.1)

## 2017-12-24 LAB — RETICULOCYTES
Immature Retic Fract: 18.9 % — ABNORMAL HIGH (ref 2.3–15.9)
RBC.: 4.79 MIL/uL (ref 4.22–5.81)
Retic Count, Absolute: 107.3 10*3/uL (ref 19.0–186.0)
Retic Ct Pct: 2.2 % (ref 0.4–3.1)

## 2017-12-24 NOTE — Telephone Encounter (Signed)
Printed avs and calender of upcoming appointment. Per 11/29 los 

## 2017-12-24 NOTE — Progress Notes (Signed)
HEMATOLOGY/ONCOLOGY CLINIC NOTE  Date of Service:  12/24/17   Patient Care Team: Patient, No Pcp Per as PCP - General (General Practice)  CHIEF COMPLAINTS  F/u for  TTP   HISTORY OF PRESENTING ILLNESS:  Richard [name] is a wonderful 27 y.o. male who has been referred to us by Fayrene HelperBowie Tran, PA-C in the Emergency Department for evaluation and management of new onset severe anemia and thrombocytopenia.   Patient has received Benadryl and is currently too sleepy to provide much information. Most of the information was obtained from the ED physician and confirmed with the fiance at bedside and key elements were confirmed with patient.   Patient fiancee noted that the patient had recurrent flu-like sx recently and he was treated with amoxacillin.   Prior to, the [facility] flu-like symptoms and he tested negative for flu and strep and was prescribed Theraflu. Steffanie RainwaterFiancee notes that since then the patient symptoms progressively worsened. Patient was evaluated in the ED with a HA on yesterday and his CT Head showed no abnormalities. Per the Provider in the ED, patient was noted to have an abnormal gait favoring his left leg along with mild decreased sensation and minimal pronator drift. Cleta AlbertsFiancee, Laura also notes that the patient had progressive SOB x [redacted] week along with right sided numbness that she noticed last night along with slurred speech.    INTERVAL HISTORY   Richard [name] is here for follow-up of his TTP that has now been in remission for more than 6 months.  He notes no acute new concerns no fevers no chills no night sweats no skin rashes no joint pains no issues with bleeding no new fatigue. Labs today were reviewed and showed normal platelet counts and no anemia. He is in positive spirits and has no acute new symptoms  MEDICAL HISTORY:  Past Medical History:  Diagnosis Date  . Sickle cell trait (HCC)     SURGICAL HISTORY: No past surgical history  on file.  SOCIAL HISTORY: Social History   Socioeconomic History  . Marital status: Single    Spouse name: Not on file  . Number of children: Not on file  . Years of education: Not on file  . Highest education level: Not on file  Occupational History  . Not on file  Social Needs  . Financial resource strain: Not on file  . Food insecurity:    Worry: Not on file    Inability: Not on file  . Transportation needs:    Medical: Not on file    Non-medical: Not on file  Tobacco Use  . Smoking status: Former Smoker    Packs/day: 0.50    Types: Cigarettes    Last attempt to quit: 01/2017    Years since quitting: 0.9  . Smokeless tobacco: Never Used  Substance and Sexual Activity  . Alcohol use: No    Frequency: Never  . Drug use: Yes    Types: Marijuana  . Sexual activity: Not on file  Lifestyle  . Physical activity:    Days per week: Not on file    Minutes per session: Not on file  . Stress: Not on file  Relationships  . Social connections:    Talks on phone: Not on file    Gets together: Not on file    Attends religious service: Not on file    Active member of club or organization: Not on file    Attends meetings  of clubs or organizations: Not on file    Relationship status: Not on file  . Intimate partner violence:    Fear of current or ex partner: Not on file    Emotionally abused: Not on file    Physically abused: Not on file    Forced sexual activity: Not on file  Other Topics Concern  . Not on file  Social History Narrative  . Not on file    FAMILY HISTORY: Family History  Problem Relation Age of Onset  . Cancer Other   . Diabetes Other   . Hypertension Other     ALLERGIES:  has No Known Allergies.  MEDICATIONS:  Current Outpatient Medications  Medication Sig Dispense Refill  . Multiple Vitamins-Minerals (MULTIVITAMIN ADULT PO) Take 1 tablet by mouth daily.     No current facility-administered medications for this visit.     REVIEW OF SYSTEMS:    .10 Point review of Systems was done is negative except as noted above.   PHYSICAL EXAMINATION:  ECOG PERFORMANCE STATUS: 0 - Asymptomatic  Vitals:   12/24/17 0907  BP: 130/76  Pulse: 89  Resp: 18  Temp: 98.2 F (36.8 C)  SpO2: 95%   Filed Weights   12/24/17 0907  Weight: 262 lb 3.2 oz (118.9 kg)   .Body mass index is 36.57 kg/m. .  . GENERAL:alert, in no acute distress and comfortable SKIN: no acute rashes, no significant lesions EYES: conjunctiva are pink and non-injected, sclera anicteric OROPHARYNX: MMM, no exudates, no oropharyngeal erythema or ulceration NECK: supple, no JVD LYMPH:  no palpable lymphadenopathy in the cervical, axillary or inguinal regions LUNGS: clear to auscultation b/l with normal respiratory effort HEART: regular rate & rhythm ABDOMEN:  normoactive bowel sounds , non tender, not distended. Extremity: no pedal edema PSYCH: alert & oriented x 3 with fluent speech NEURO: no focal motor/sensory deficits   LABORATORY DATA:  I have reviewed the data as listed  . CBC Latest Ref Rng & Units 12/24/2017 08/27/2017 06/11/2017  WBC 4.0 - 10.5 K/uL 7.9 8.2 6.9  Hemoglobin 13.0 - 17.0 g/dL 16.1 09.6 04.5  Hematocrit 39.0 - 52.0 % 43.6 43.5 40.8  Platelets 150 - 400 K/uL 274 287 261    . CMP Latest Ref Rng & Units 08/27/2017 06/11/2017 04/20/2017  Glucose 70 - 99 mg/dL 84 95 93  BUN 6 - 20 mg/dL 13 12 6(L)  Creatinine 0.61 - 1.24 mg/dL 4.09 8.11 9.14  Sodium 135 - 145 mmol/L 141 139 140  Potassium 3.5 - 5.1 mmol/L 4.0 3.7 3.6  Chloride 98 - 111 mmol/L 104 107 108  CO2 22 - 32 mmol/L 26 23 23   Calcium 8.9 - 10.3 mg/dL 9.8 9.4 9.5  Total Protein 6.5 - 8.1 g/dL 7.9 7.2 6.8  Total Bilirubin 0.3 - 1.2 mg/dL 1.0 1.0 0.6  Alkaline Phos 38 - 126 U/L 80 72 69  AST 15 - 41 U/L 25 26 20   ALT 0 - 44 U/L 23 21 17    . Lab Results  Component Value Date   LDH 193 06/11/2017     RADIOGRAPHIC STUDIES: I have personally reviewed the radiological images as  listed and agreed with the findings in the report. No results found.  ASSESSMENT & PLAN:   27 y.o. 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 On presentation hemoglobin was down to 5.8  with elevated LDH of 1300s. HIV test -non reactive Last plasma exchange done on 02/18/2017 ADAM TS 13 post treatment 48.5 now improved to 76.4 Completed Prednisone in 03/2017. Patient has now been off steroids since then. 2. S/p Severe Thrombocytopenia - likely from TTP/HUS. Platelets were 16k on initial presentation. Currently resolved. -PLT at 274k today    PLAN -Patient has no clinical or laboratory evidence of TTP relapse at this time. -Patient is aware of concerning symptoms to monitor for and let us know immediately if they occur. -Labs were reviewed with him in detail -Recommended avoiding NSAIDs other than acetaminophen  Return to clinic with Dr. Candise Che with labs in 3 months  All of the patients questions were answered with apparent satisfaction. The patient knows to call the clinic with any problems, questions or concerns.    Wyvonnia Lora MD MS AAHIVMS [facility] Hematology/Oncology Physician [facility]  (Office):       [phone] (Work cell):  [phone] (Fax):           [phone]

## 2017-12-28 ENCOUNTER — Emergency Department (HOSPITAL_COMMUNITY): Payer: Managed Care, Other (non HMO)

## 2017-12-28 ENCOUNTER — Emergency Department (HOSPITAL_COMMUNITY)
Admission: EM | Admit: 2017-12-28 | Discharge: 2017-12-28 | Disposition: A | Discharge status: Home / Self Care | Payer: Managed Care, Other (non HMO) | Attending: Emergency Medicine | Admitting: Emergency Medicine

## 2017-12-28 ENCOUNTER — Other Ambulatory Visit: Payer: Self-pay

## 2017-12-28 ENCOUNTER — Encounter (HOSPITAL_COMMUNITY): Payer: Self-pay | Admitting: *Deleted

## 2017-12-28 DIAGNOSIS — Z79899 Other long term (current) drug therapy: Secondary | ICD-10-CM | POA: Insufficient documentation

## 2017-12-28 DIAGNOSIS — R519 Headache, unspecified: Secondary | ICD-10-CM

## 2017-12-28 DIAGNOSIS — Z87891 Personal history of nicotine dependence: Secondary | ICD-10-CM | POA: Insufficient documentation

## 2017-12-28 DIAGNOSIS — R51 Headache: Secondary | ICD-10-CM | POA: Diagnosis not present

## 2017-12-28 HISTORY — DX: Thrombotic microangiopathy: M31.1

## 2017-12-28 MED ORDER — ACETAMINOPHEN 500 MG PO TABS
1000.0000 mg | ORAL_TABLET | Freq: Once | ORAL | Status: AC
  Administered 2017-12-28: 1000 mg via ORAL
  Filled 2017-12-28: qty 2

## 2017-12-28 MED ORDER — ONDANSETRON 8 MG PO TBDP
8.0000 mg | ORAL_TABLET | Freq: Once | ORAL | Status: AC
  Administered 2017-12-28: 8 mg via ORAL
  Filled 2017-12-28: qty 1

## 2017-12-28 NOTE — ED Notes (Signed)
Pt reports having pressure across forehead and behind eyes along with pressure in ears that started on Sunday.

## 2017-12-28 NOTE — ED Provider Notes (Signed)
[facility]-EMERGENCY DEPT Provider Note: Lowella Dell, MD, FACEP  CSN: [id number] MRN: [id number] ARRIVAL: 12/28/17 at 0245 ROOM: WA11/WA11   CHIEF COMPLAINT  Headache   HISTORY OF PRESENT ILLNESS  12/28/17 4:59 AM Richard [name] is a 27 y.o. male with a history of thrombotic thrombocytopenic purpura whose blood counts and electrolytes are currently normal as of 4 days ago.  He is here with a 2-day history of intermittent headaches.  The headaches are frontal and pressure-like as well as throbbing.  He has had some associated nausea and photophobia and one episode of vomiting.  He rates his headache currently as a 6 out of 10.  He has not taken anything for his headache as he was told to avoid NSAIDs.  He is having no focal neurologic deficits.   Past Medical History:  Diagnosis Date  . Sickle cell trait (HCC)   . T.T.P. syndrome (HCC)     History reviewed. No pertinent surgical history.  Family History  Problem Relation Age of Onset  . Cancer Other   . Diabetes Other   . Hypertension Other     Social History   Tobacco Use  . Smoking status: Former Smoker    Packs/day: 0.50    Types: Cigarettes    Last attempt to quit: 01/2017    Years since quitting: 0.9  . Smokeless tobacco: Never Used  Substance Use Topics  . Alcohol use: No    Frequency: Never  . Drug use: Yes    Types: Marijuana    Prior to Admission medications   Medication Sig Start Date End Date Taking? Authorizing Provider  fexofenadine (ALLEGRA) 180 MG tablet Take 180 mg by mouth daily as needed for allergies or rhinitis.   Yes [provider]    Allergies Patient has no known allergies.   REVIEW OF SYSTEMS  Negative except as noted here or in the History of Present Illness.   PHYSICAL EXAMINATION  Initial Vital Signs Blood pressure (!) 140/99, pulse 71, temperature 98.1 F (36.7 C), temperature source Oral, resp. rate 17, height 5\' 11"  (1.803 m), weight 118.9 kg, SpO2 99  %.  Examination General: Well-developed, well-nourished male in no acute distress; appearance consistent with age of record HENT: normocephalic; atraumatic Eyes: pupils equal, round and reactive to light; extraocular muscles intact Neck: supple Heart: regular rate and rhythm; no murmurs, rubs or gallops Lungs: clear to auscultation bilaterally Abdomen: soft; nondistended; nontender; no masses or hepatosplenomegaly; bowel sounds present Extremities: No deformity; full range of motion; pulses normal Neurologic: Awake, alert and oriented; motor function intact in all extremities and symmetric; no facial droop Skin: Warm and dry Psychiatric: Normal mood and affect   RESULTS  Summary of this visit's results, reviewed by myself:   EKG Interpretation  Date/Time:    Ventricular Rate:    PR Interval:    QRS Duration:   QT Interval:    QTC Calculation:   R Axis:     Text Interpretation:        Laboratory Studies: No results found for this or any previous visit (from the past 24 hour(s)). Imaging Studies: Ct Head Wo Contrast  Result Date: 12/28/2017 CLINICAL DATA:  27 year old male with head pressure. EXAM: CT HEAD WITHOUT CONTRAST TECHNIQUE: Contiguous axial images were obtained from the base of the skull through the vertex without intravenous contrast. COMPARISON:  Brain MRI dated 02/12/2017 FINDINGS: Brain: No evidence of acute infarction, hemorrhage, hydrocephalus, extra-axial collection or mass lesion/mass effect. Vascular: No hyperdense vessel or  unexpected calcification. Skull: Normal. Negative for fracture or focal lesion. Sinuses/Orbits: No acute finding. Other: None IMPRESSION: Unremarkable noncontrast CT of the brain. Electronically Signed   By: Elgie CollardArash  Radparvar M.D.   On: 12/28/2017 06:40    ED COURSE and MDM  Nursing notes and initial vitals signs, including pulse oximetry, reviewed.  Vitals:   12/28/17 0300 12/28/17 0309 12/28/17 0620  BP: (!) 140/99  133/84  Pulse:  71  64  Resp: 17  18  Temp: 98.1 F (36.7 C)    TempSrc: Oral    SpO2: 99%  98%  Weight:  118.9 kg   Height:  5\' 11"  (1.803 m)    7:46 AM She is feeling better after Tylenol and Zofran.  He was advised he can take Tylenol without concern about platelet effects.  CT is reassuring.  PROCEDURES    ED DIAGNOSES     ICD-10-CM   1. Bad headache R51        Larene Ascencio, MD 12/28/17 [phone]

## 2017-12-28 NOTE — ED Notes (Signed)
ED Provider at bedside. 

## 2017-12-28 NOTE — ED Triage Notes (Signed)
Pt stated "I was dx'd with TTP earlier this year, saw Dr. Ulyess MortKahle last Friday and everything checked out.  I feel just like before I was dx'd.  I feel weak, have the headache.  I feel a lot of pressure in the front of my head."

## 2018-03-25 ENCOUNTER — Telehealth: Payer: Self-pay | Admitting: Hematology

## 2018-03-25 ENCOUNTER — Inpatient Hospital Stay: Payer: Managed Care, Other (non HMO) | Admitting: Hematology

## 2018-03-25 ENCOUNTER — Inpatient Hospital Stay: Payer: Managed Care, Other (non HMO)

## 2018-03-25 NOTE — Telephone Encounter (Signed)
Called pt per 2/28 sch message - unable to reach patient left message. To call back to r/s

## 2019-03-04 ENCOUNTER — Other Ambulatory Visit: Payer: Self-pay

## 2019-03-04 ENCOUNTER — Encounter (HOSPITAL_COMMUNITY): Payer: Self-pay | Admitting: Emergency Medicine

## 2019-03-04 ENCOUNTER — Emergency Department (HOSPITAL_COMMUNITY): Payer: 59

## 2019-03-04 ENCOUNTER — Inpatient Hospital Stay (HOSPITAL_COMMUNITY): Payer: 59

## 2019-03-04 ENCOUNTER — Observation Stay (HOSPITAL_COMMUNITY)
Admission: EM | Admit: 2019-03-04 | Discharge: 2019-03-04 | DRG: 546 | Discharge status: Against Medical Advice | Payer: 59 | Attending: Internal Medicine | Admitting: Internal Medicine

## 2019-03-04 DIAGNOSIS — D696 Thrombocytopenia, unspecified: Secondary | ICD-10-CM | POA: Diagnosis present

## 2019-03-04 DIAGNOSIS — N179 Acute kidney failure, unspecified: Secondary | ICD-10-CM | POA: Diagnosis present

## 2019-03-04 DIAGNOSIS — D571 Sickle-cell disease without crisis: Secondary | ICD-10-CM | POA: Diagnosis not present

## 2019-03-04 DIAGNOSIS — Z833 Family history of diabetes mellitus: Secondary | ICD-10-CM | POA: Diagnosis not present

## 2019-03-04 DIAGNOSIS — D72829 Elevated white blood cell count, unspecified: Secondary | ICD-10-CM | POA: Diagnosis not present

## 2019-03-04 DIAGNOSIS — Z87891 Personal history of nicotine dependence: Secondary | ICD-10-CM

## 2019-03-04 DIAGNOSIS — M311 Thrombotic microangiopathy: Principal | ICD-10-CM | POA: Diagnosis present

## 2019-03-04 DIAGNOSIS — D649 Anemia, unspecified: Secondary | ICD-10-CM | POA: Diagnosis present

## 2019-03-04 DIAGNOSIS — R945 Abnormal results of liver function studies: Secondary | ICD-10-CM | POA: Diagnosis not present

## 2019-03-04 DIAGNOSIS — Z8249 Family history of ischemic heart disease and other diseases of the circulatory system: Secondary | ICD-10-CM | POA: Diagnosis not present

## 2019-03-04 DIAGNOSIS — Z5329 Procedure and treatment not carried out because of patient's decision for other reasons: Secondary | ICD-10-CM | POA: Diagnosis not present

## 2019-03-04 DIAGNOSIS — R0602 Shortness of breath: Secondary | ICD-10-CM

## 2019-03-04 DIAGNOSIS — Z20822 Contact with and (suspected) exposure to covid-19: Secondary | ICD-10-CM | POA: Diagnosis not present

## 2019-03-04 DIAGNOSIS — R519 Headache, unspecified: Secondary | ICD-10-CM | POA: Diagnosis not present

## 2019-03-04 DIAGNOSIS — R7402 Elevation of levels of lactic acid dehydrogenase (LDH): Secondary | ICD-10-CM | POA: Diagnosis not present

## 2019-03-04 DIAGNOSIS — Z79899 Other long term (current) drug therapy: Secondary | ICD-10-CM

## 2019-03-04 DIAGNOSIS — M3119 Other thrombotic microangiopathy: Secondary | ICD-10-CM | POA: Diagnosis present

## 2019-03-04 HISTORY — DX: Other thrombotic microangiopathy: M31.19

## 2019-03-04 LAB — IRON AND TIBC
Iron: 151 ug/dL (ref 45–182)
Saturation Ratios: 43 % — ABNORMAL HIGH (ref 17.9–39.5)
TIBC: 353 ug/dL (ref 250–450)
UIBC: 202 ug/dL

## 2019-03-04 LAB — CBC WITH DIFFERENTIAL/PLATELET
Abs Immature Granulocytes: 0.13 10*3/uL — ABNORMAL HIGH (ref 0.00–0.07)
Basophils Absolute: 0 10*3/uL (ref 0.0–0.1)
Basophils Relative: 0 %
Eosinophils Absolute: 0.1 10*3/uL (ref 0.0–0.5)
Eosinophils Relative: 1 %
HCT: 23.3 % — ABNORMAL LOW (ref 39.0–52.0)
Hemoglobin: 8 g/dL — ABNORMAL LOW (ref 13.0–17.0)
Immature Granulocytes: 1 %
Lymphocytes Relative: 20 %
Lymphs Abs: 2.5 10*3/uL (ref 0.7–4.0)
MCH: 32.3 pg (ref 26.0–34.0)
MCHC: 34.3 g/dL (ref 30.0–36.0)
MCV: 94 fL (ref 80.0–100.0)
Monocytes Absolute: 1.2 10*3/uL — ABNORMAL HIGH (ref 0.1–1.0)
Monocytes Relative: 10 %
Neutro Abs: 8.7 10*3/uL — ABNORMAL HIGH (ref 1.7–7.7)
Neutrophils Relative %: 68 %
Platelets: 14 10*3/uL — CL (ref 150–400)
RBC: 2.48 MIL/uL — ABNORMAL LOW (ref 4.22–5.81)
RDW: 17.2 % — ABNORMAL HIGH (ref 11.5–15.5)
WBC: 12.6 10*3/uL — ABNORMAL HIGH (ref 4.0–10.5)
nRBC: 2.2 % — ABNORMAL HIGH (ref 0.0–0.2)

## 2019-03-04 LAB — RESPIRATORY PANEL BY RT PCR (FLU A&B, COVID)
Influenza A by PCR: NEGATIVE
Influenza B by PCR: NEGATIVE
SARS Coronavirus 2 by RT PCR: NEGATIVE

## 2019-03-04 LAB — COMPREHENSIVE METABOLIC PANEL
ALT: 24 U/L (ref 0–44)
AST: 44 U/L — ABNORMAL HIGH (ref 15–41)
Albumin: 4.1 g/dL (ref 3.5–5.0)
Alkaline Phosphatase: 64 U/L (ref 38–126)
Anion gap: 12 (ref 5–15)
BUN: 17 mg/dL (ref 6–20)
CO2: 23 mmol/L (ref 22–32)
Calcium: 8.5 mg/dL — ABNORMAL LOW (ref 8.9–10.3)
Chloride: 102 mmol/L (ref 98–111)
Creatinine, Ser: 1.39 mg/dL — ABNORMAL HIGH (ref 0.61–1.24)
GFR calc Af Amer: >60 mL/min (ref >60)
GFR calc non Af Amer: >60 mL/min (ref >60)
Glucose, Bld: 118 mg/dL — ABNORMAL HIGH (ref 70–99)
Potassium: 3.8 mmol/L (ref 3.5–5.1)
Sodium: 137 mmol/L (ref 135–145)
Total Bilirubin: 3.2 mg/dL — ABNORMAL HIGH (ref 0.3–1.2)
Total Protein: 7.2 g/dL (ref 6.5–8.1)

## 2019-03-04 LAB — URINALYSIS, ROUTINE W REFLEX MICROSCOPIC
Bacteria, UA: NONE SEEN
Bilirubin Urine: NEGATIVE
Glucose, UA: NEGATIVE mg/dL
Ketones, ur: NEGATIVE mg/dL
Leukocytes,Ua: NEGATIVE
Nitrite: NEGATIVE
Protein, ur: 30 mg/dL — AB
Specific Gravity, Urine: 1.009 (ref 1.005–1.030)
pH: 7 (ref 5.0–8.0)

## 2019-03-04 LAB — SEDIMENTATION RATE: Sed Rate: 25 mm/hr — ABNORMAL HIGH (ref 0–16)

## 2019-03-04 LAB — BILIRUBIN, FRACTIONATED(TOT/DIR/INDIR)
Bilirubin, Direct: 0.4 mg/dL — ABNORMAL HIGH (ref 0.0–0.2)
Indirect Bilirubin: 3.2 mg/dL — ABNORMAL HIGH (ref 0.3–0.9)
Total Bilirubin: 3.6 mg/dL — ABNORMAL HIGH (ref 0.3–1.2)

## 2019-03-04 LAB — VITAMIN B12: Vitamin B-12: 252 pg/mL (ref 180–914)

## 2019-03-04 LAB — FERRITIN: Ferritin: 938 ng/mL — ABNORMAL HIGH (ref 24–336)

## 2019-03-04 LAB — LACTATE DEHYDROGENASE: LDH: 964 U/L — ABNORMAL HIGH (ref 98–192)

## 2019-03-04 LAB — C-REACTIVE PROTEIN: CRP: 0.7 mg/dL (ref <1.0)

## 2019-03-04 MED ORDER — SODIUM CHLORIDE 0.9% FLUSH: 3.0000 mL | Freq: Two times a day (BID) | INTRAVENOUS | Status: DC

## 2019-03-04 MED ORDER — ACETAMINOPHEN 500 MG PO TABS
1000.0000 mg | ORAL_TABLET | Freq: Once | ORAL | Status: AC
  Administered 2019-03-04: 1000 mg via ORAL
  Filled 2019-03-04: qty 2

## 2019-03-04 MED ORDER — ONDANSETRON HCL 4 MG/2ML IJ SOLN: 4.0000 mg | Freq: Four times a day (QID) | INTRAMUSCULAR | Status: DC | PRN

## 2019-03-04 MED ORDER — SODIUM CHLORIDE 0.9 % IV SOLN: 250.0000 mL | INTRAVENOUS | Status: DC | PRN

## 2019-03-04 MED ORDER — TRAMADOL HCL 50 MG PO TABS: 50.0000 mg | ORAL_TABLET | Freq: Four times a day (QID) | ORAL | Status: DC | PRN

## 2019-03-04 MED ORDER — SODIUM CHLORIDE 0.9% FLUSH: 3.0000 mL | INTRAVENOUS | Status: DC | PRN

## 2019-03-04 MED ORDER — HYDRALAZINE HCL 20 MG/ML IJ SOLN: 10.0000 mg | Freq: Four times a day (QID) | INTRAMUSCULAR | Status: DC | PRN

## 2019-03-04 MED ORDER — ACETAMINOPHEN 650 MG RE SUPP: 650.0000 mg | Freq: Four times a day (QID) | RECTAL | Status: DC | PRN

## 2019-03-04 MED ORDER — ONDANSETRON 4 MG PO TBDP
4.0000 mg | ORAL_TABLET | Freq: Once | ORAL | Status: AC
  Administered 2019-03-04: 05:00:00 4 mg via ORAL
  Filled 2019-03-04: qty 1

## 2019-03-04 MED ORDER — ACETAMINOPHEN 325 MG PO TABS: 650.0000 mg | ORAL_TABLET | Freq: Four times a day (QID) | ORAL | Status: DC | PRN

## 2019-03-04 NOTE — ED Notes (Signed)
He arouses easily and is oriented x 4 with clear speech. He tells me that he has a bit of a h/a "like a migraine", however he assures me he does not require any pain med. At this time. I instruct him at any time to please let me know if he feels he does need pain medication. I also discuss his b/p at this time and that we would be monitoring it, and would treat it if it ever becomes to elevated.

## 2019-03-04 NOTE — ED Notes (Signed)
I have just phoned report to nurse Becky at [facility]. Pt. Is to go to rm. [phone]. I will notify [facility] now, and, if necessary, will notify [facility].

## 2019-03-04 NOTE — ED Triage Notes (Signed)
Patient complaining of sob, headache, and throwing up since Monday. Patient states it has got worse. Patient says he has been around someone with covid.

## 2019-03-04 NOTE — ED Notes (Signed)
He abruptly leaves the E.D. He is very angry in appearance. I notify his hospitalist Dr. Sharolyn Douglas of this event. She tells me she had spoken with Richard [name] at length of the risks and dangers of his condition; and she is aware of his leaving AMA.

## 2019-03-04 NOTE — H&P (Addendum)
TRH H&P    Patient Demographics:    Richard [name], is a 29 y.o. male  MRN: [id number]  DOB - [birth year]  Admit Date - 03/04/2019  Referring MD/NP/PA:  Erasmo Downer Ward  Outpatient Primary MD for the patient is Patient, No Pcp Per Fabienne Bruns - oncology  Patient coming from:  home  Chief complaint- headache , I feel like I have TTP   HPI:    Richard [name]  is a 29 y.o. male,  w sickle cell , h/o TTP, presents w c/o headache, and dyspnea and slight nausea since Monday. The headache is left frontal.   Pt states that these symptoms are similar to what he had when he had TTP.     No new medications .  Pt last had follow up with Dr. Irene Limbo 12/24/2017 at which time his Wbc 7.9, Hgb 14.7, Plt 274  In ED,  T 97.6, P 87 R 18, Bp 149/96  Pox 100% on RA Wt 120.7kg  Wbc 12.6, Hgb 8.0, Plt 14 Na 137 , K 3.8, Bun 17, Creatinine 1.39 Ast 44, Alt 24, Alk phos 64, T. Bili 3.2  I personally spoke with Dr. Maylon Peppers who will come to see the patient.   Pt will be admitted for suspected TTP    Review of systems:    In addition to the HPI above,  No Fever-chills,  No changes with Vision or hearing, No problems swallowing food or Liquids, No Chest pain, No Cough   No Abdominal pain, bowel movements are regular, No Blood in stool or Urine, No dysuria, No new skin rashes or bruises, No new joints pains-aches,  No new weakness, tingling, numbness in any extremity, No recent weight gain or loss, No polyuria, polydypsia or polyphagia, No significant Mental Stressors.  All other systems reviewed and are negative.    Past History of the following :    Past Medical History:  Diagnosis Date  . Sickle cell trait ([place])   . T.T.P. syndrome ([place])       History reviewed. No pertinent surgical history.    Social History:      Social History   Tobacco Use  . Smoking status: Former Smoker    Packs/day: 0.50    Types:  Cigarettes    Quit date: 01/2017    Years since quitting: 2.1  . Smokeless tobacco: Never Used  Substance Use Topics  . Alcohol use: No       Family History :     Family History  Problem Relation Age of Onset  . Cancer Other   . Diabetes Other   . Hypertension Other        Home Medications:   Prior to Admission medications   Medication Sig Start Date End Date Taking? Authorizing Provider  JEHUDJSHFW 263 MG/5ML SYRP Take 5 mLs by mouth daily.   Yes [provider]  fluticasone (FLONASE) 50 MCG/ACT nasal spray Place 1 spray into both nostrils daily as needed for allergies or rhinitis.   Yes [provider]  Multiple Vitamin (  MULTIVITAMIN WITH MINERALS) TABS tablet Take 1 tablet by mouth daily.   Yes [provider]  Phenylephrine-DM-GG-APAP (TYLENOL COLD/FLU SEVERE) 5-10-200-325 MG TABS Take 2 tablets by mouth every 6 (six) hours as needed (cough).   Yes [provider]  Potassium 99 MG TABS Take 99 mg by mouth daily.   Yes [provider]  Zinc 25 MG TABS Take 25 mg by mouth daily.   Yes [provider]     Allergies:    No Known Allergies   Physical Exam:   Vitals  Blood pressure (!) 149/90, pulse 88, temperature 97.6 F (36.4 C), temperature source Oral, resp. rate 19, height '5\' 11"'$  (1.803 m), weight 120.7 kg, SpO2 100 %.  1.  General: axoxo3  2. Psychiatric: euthymic  3. Neurologic: Nonfocal, cn2-12 intact, reflexes 2+ symmetric, diffuse with no clonus, motor 5/5 in all 4 ext  4. HEENMT:  Icteric, pupils 1.1m symmetric, direct, consensual, near intact Eomi, tongue midline, no petechiae on roof of mouth, but therre is slight red spots on lower lip Neck: no jvd  5. Respiratory : CTAB  6. Cardiovascular : rrr s1, s2, no m/g/r  7. Gastrointestinal:  Abd: soft, nt, nd, +bs  8. Skin:  Ext: no c/c/e, no obvious bruise  9.Musculoskeletal:  Good ROM    Data Review:    CBC Recent Labs  Lab  03/04/19 0439  WBC 12.6*  HGB 8.0*  HCT 23.3*  PLT 14*  MCV 94.0  MCH 32.3  MCHC 34.3  RDW 17.2*  LYMPHSABS 2.5  MONOABS 1.2*  EOSABS 0.1  BASOSABS 0.0   ------------------------------------------------------------------------------------------------------------------  Results for orders placed or performed during the hospital encounter of 03/04/19 (from the past 48 hour(s))  CBC with Differential     Status: Abnormal   Collection Time: 03/04/19  4:39 AM  Result Value Ref Range   WBC 12.6 (H) 4.0 - 10.5 K/uL   RBC 2.48 (L) 4.22 - 5.81 MIL/uL   Hemoglobin 8.0 (L) 13.0 - 17.0 g/dL   HCT 23.3 (L) 39.0 - 52.0 %   MCV 94.0 80.0 - 100.0 fL   MCH 32.3 26.0 - 34.0 pg   MCHC 34.3 30.0 - 36.0 g/dL   RDW 17.2 (H) 11.5 - 15.5 %   Platelets 14 (LL) 150 - 400 K/uL    Comment: REPEATED TO VERIFY PLATELET COUNT CONFIRMED BY SMEAR SPECIMEN CHECKED FOR CLOTS Immature Platelet Fraction may be clinically indicated, consider ordering this additional test [id number]   nRBC 2.2 (H) 0.0 - 0.2 %   Neutrophils Relative % 68 %   Neutro Abs 8.7 (H) 1.7 - 7.7 K/uL   Lymphocytes Relative 20 %   Lymphs Abs 2.5 0.7 - 4.0 K/uL   Monocytes Relative 10 %   Monocytes Absolute 1.2 (H) 0.1 - 1.0 K/uL   Eosinophils Relative 1 %   Eosinophils Absolute 0.1 0.0 - 0.5 K/uL   Basophils Relative 0 %   Basophils Absolute 0.0 0.0 - 0.1 K/uL   Immature Granulocytes 1 %   Abs Immature Granulocytes 0.13 (H) 0.00 - 0.07 K/uL    Comment: Performed at [facility] [address] [place] [id number] Comprehensive metabolic panel     Status: Abnormal   Collection Time: 03/04/19  4:39 AM  Result Value Ref Range   Sodium 137 135 - 145 mmol/L   Potassium 3.8 3.5 - 5.1 mmol/L   Chloride 102 98 - 111 mmol/L   CO2 23 22 -  32 mmol/L   Glucose, Bld 118 (H) 70 - 99 mg/dL   BUN 17 6 - 20 mg/dL   Creatinine, Ser 1.39 (H) 0.61 - 1.24 mg/dL   Calcium 8.5 (L) 8.9 - 10.3 mg/dL   Total Protein  7.2 6.5 - 8.1 g/dL   Albumin 4.1 3.5 - 5.0 g/dL   AST 44 (H) 15 - 41 U/L   ALT 24 0 - 44 U/L   Alkaline Phosphatase 64 38 - 126 U/L   Total Bilirubin 3.2 (H) 0.3 - 1.2 mg/dL   GFR calc non Af Amer >60 >60 mL/min   GFR calc Af Amer >60 >60 mL/min   Anion gap 12 5 - 15    Comment: Performed at [facility], [address] [place], [place] [address]    Chemistries  Recent Labs  Lab 03/04/19 0439  NA 137  K 3.8  CL 102  CO2 23  GLUCOSE 118*  BUN 17  CREATININE 1.39*  CALCIUM 8.5*  AST 44*  ALT 24  ALKPHOS 64  BILITOT 3.2*   ------------------------------------------------------------------------------------------------------------------  ------------------------------------------------------------------------------------------------------------------ GFR: Estimated Creatinine Clearance: 103.7 mL/min (A) (by C-G formula based on SCr of 1.39 mg/dL (H)). Liver Function Tests: Recent Labs  Lab 03/04/19 0439  AST 44*  ALT 24  ALKPHOS 64  BILITOT 3.2*  PROT 7.2  ALBUMIN 4.1   No results for input(s): LIPASE, AMYLASE in the last 168 hours. No results for input(s): AMMONIA in the last 168 hours. Coagulation Profile: No results for input(s): INR, PROTIME in the last 168 hours. Cardiac Enzymes: No results for input(s): CKTOTAL, CKMB, CKMBINDEX, TROPONINI in the last 168 hours. BNP (last 3 results) No results for input(s): PROBNP in the last 8760 hours. HbA1C: No results for input(s): HGBA1C in the last 72 hours. CBG: No results for input(s): GLUCAP in the last 168 hours. Lipid Profile: No results for input(s): CHOL, HDL, LDLCALC, TRIG, CHOLHDL, LDLDIRECT in the last 72 hours. Thyroid Function Tests: No results for input(s): TSH, T4TOTAL, FREET4, T3FREE, THYROIDAB in the last 72 hours. Anemia Panel: No results for input(s): VITAMINB12, FOLATE, FERRITIN, TIBC, IRON, RETICCTPCT in the last 72  hours.  --------------------------------------------------------------------------------------------------------------- Urine analysis:    Component Value Date/Time   COLORURINE YELLOW 02/12/2017 2102   APPEARANCEUR CLEAR 02/12/2017 2102   LABSPEC 1.024 02/12/2017 2102   PHURINE 6.0 02/12/2017 2102   GLUCOSEU NEGATIVE 02/12/2017 2102   HGBUR MODERATE (A) 02/12/2017 2102   BILIRUBINUR NEGATIVE 02/12/2017 2102   KETONESUR NEGATIVE 02/12/2017 2102   PROTEINUR NEGATIVE 02/12/2017 2102   UROBILINOGEN 0.2 04/19/2013 1617   NITRITE NEGATIVE 02/12/2017 2102   LEUKOCYTESUR NEGATIVE 02/12/2017 2102      Imaging Results:    DG Chest Port 1 View  Result Date: 03/04/2019 CLINICAL DATA:  Headache. EXAM: PORTABLE CHEST 1 VIEW COMPARISON:  02/12/2017 FINDINGS: The heart size is borderline enlarged. There is no focal infiltrate or large pleural effusion. There is no pneumothorax. There is no acute osseous abnormality. IMPRESSION: Borderline cardiomegaly without acute cardiopulmonary disease. Electronically Signed   By: Constance Holster M.D.   On: 03/04/2019 03:39       Assessment & Plan:    Principal Problem:   TTP (thrombotic thrombocytopenic purpura) (HCC) Active Problems:   Thrombocytopenia (HCC)   Sickle cell anemia (HCC)  Thrombocytopenia, likely TTP Check LDH Check peripheral smear Check ADAM TS13 activity Please follow up on urinalysis Oncology consult requested, spoke with Dr. Maylon Peppers  Anemia Check fractionated bilirubin Check Ferritin, iron, tibc,  b12, folate, ESR, Consider Spep, Immunofixation Check cbc at at 1pm, please follow up  Abnormal liver function, Elevated Bilirubin Check fractionated bilirubin Check acute hepatitis panel Check cmp  At 1pm, please follow up   Headache w Plt 14 CT brain noncontrast,  Pt can't tolerate MRI  Please follow up on CT brain    DVT Prophylaxis-   SCDs   AM Labs Ordered, also please review Full Orders  Family Communication:  Admission, patients condition and plan of care including tests being ordered have been discussed with the patient who indicate understanding and agree with the plan and Code Status.  Code Status:  FULL CODE per patient, Notified his significant other that pt has been [facility] to [facility]  Admission status: Inpatient: Based on patients clinical presentation and evaluation of above clinical data, I have made determination that patient meets Inpatient criteria at this time.    Time spent in minutes :  70 minutes   Jani Gravel M.D on 03/04/2019 at 6:32 AM

## 2019-03-04 NOTE — ED Provider Notes (Addendum)
TIME SEEN: 3:53 AM  CHIEF COMPLAINT: Multiple complaints  HPI: Patient is a 29 year old male with history of sickle cell trait, TTP who presents to the emergency department with complaints of left throbbing headache, left-sided ear pain, shortness of breath with exertion, nausea, vomiting, diarrhea, discomfort in both of his legs since Monday, February 1.  No known fevers, chills, neck pain or neck stiffness, sore throat, chest pain, cough, abdominal pain, dysuria or hematuria.  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  States he is worried because some of the symptoms filled tomorrow when he had TTP.  No easy bleeding or bruising.  No gum bleeding.  No melena or hematochezia.  ROS: See HPI Constitutional: no fever  Eyes: no drainage  ENT: no runny nose   Cardiovascular:  no chest pain  Resp:  SOB  GI: vomiting, diarrhea GU: no dysuria Integumentary: no rash  Allergy: no hives  Musculoskeletal: no leg swelling  Neurological: no slurred speech ROS otherwise negative  PAST MEDICAL HISTORY/PAST SURGICAL HISTORY:  Past Medical History:  Diagnosis Date  . Sickle cell trait (HCC)   . T.T.P. syndrome ([facility])     MEDICATIONS:  Prior to Admission medications   Medication Sig Start Date End Date Taking? Authorizing Provider  fexofenadine (ALLEGRA) 180 MG tablet Take 180 mg by mouth daily as needed for allergies or rhinitis.    [provider]    ALLERGIES:  No Known Allergies  SOCIAL HISTORY:  Social History   Tobacco Use  . Smoking status: Former Smoker    Packs/day: 0.50    Types: Cigarettes    Quit date: 01/2017    Years since quitting: 2.1  . Smokeless tobacco: Never Used  Substance Use Topics  . Alcohol use: No    FAMILY HISTORY: Family History  Problem Relation Age of Onset  . Cancer Other   . Diabetes Other   . Hypertension Other     EXAM: BP (!) 142/87   Pulse 87   Temp 97.6 F  (36.4 C) (Oral)   Resp 19   Ht 5\' 11"  (1.803 m)   Wt 120.7 kg   SpO2 100%   BMI 37.10 kg/m  CONSTITUTIONAL: Alert and oriented and responds appropriately to questions. Well-appearing; well-nourished HEAD: Normocephalic EYES: Conjunctivae clear, pupils appear equal, EOM appear intact ENT: normal nose; moist mucous membranes; TMs are clear bilaterally without erythema, purulence, bulging, perforation, effusion.  No cerumen impaction or sign of foreign body in the external auditory canal. No inflammation, erythema or drainage from the external auditory canal. No signs of mastoiditis. No pain with manipulation of the pinna bilaterally. NECK: Supple, normal ROM CARD: RRR; S1 and S2 appreciated; no murmurs, no clicks, no rubs, no gallops RESP: Normal chest excursion without splinting or tachypnea; breath sounds clear and equal bilaterally; no wheezes, no rhonchi, no rales, no hypoxia or respiratory distress, speaking full sentences ABD/GI: Normal bowel sounds; non-distended; soft, non-tender, no rebound, no guarding, no peritoneal signs, no hepatosplenomegaly BACK:  The back appears normal EXT: Normal ROM in all joints; no deformity noted, no edema; no cyanosis, no calf tenderness or calf swelling, no tenderness with palpation of the lower extremities, legs are warm and well perfused without redness or warmth SKIN: Normal color for age and race; warm; no rash on exposed skin NEURO: Moves all extremities equally PSYCH: The patient's mood and manner are appropriate.   MEDICAL DECISION MAKING: Patient  here with nonspecific symptoms.  Discussed with patient that this could be viral illness, COVID-19.  He is concerned as he states the symptoms felt similar to when he had TTP several years ago.  Will check labs, urine.  Chest x-ray obtained in triage shows no acute abnormality.  Will swab for COVID-19.  Will give Tylenol, Zofran for symptomatic relief.  Patient is well-appearing here and nontoxic.  Doubt  meningitis, encephalitis.  He has no hypoxia or increased work of breathing currently.  Abdominal exam is benign.  ED PROGRESS: Patient appears to have TTP again today.  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  Will admit to the hospitalist service.  He improved with plasmapheresis with similar presentation in 2019.  He is followed by Dr. Irene Limbo with heme/onc.   5:43 AM Discussed patient's case with hospitalist, Dr. Maudie Mercury.  I have recommended admission and patient (and family if present) agree with this plan. Admitting physician will place admission orders.   I reviewed all nursing notes, vitals, pertinent previous records and interpreted all EKGs, lab and urine results, imaging (as available).      EKG Interpretation  Date/Time:  Saturday March 04 2019 03:18:08 EST Ventricular Rate:  87 PR Interval:    QRS Duration: 88 QT Interval:  372 QTC Calculation: 448 R Axis:   83 Text Interpretation: Sinus rhythm Borderline T wave abnormalities No significant change since last tracing other than rate is slower Confirmed by Pryor Curia [phone]) on 03/04/2019 3:21:53 AM        CRITICAL CARE Performed by: Cyril Mourning Ashleah Valtierra   Total critical care time: 45 minutes  Critical care time was exclusive of separately billable procedures and treating other patients.  Critical care was necessary to treat or prevent imminent or life-threatening deterioration.  Critical care was time spent personally by me on the following activities: development of treatment plan with patient and/or surrogate as well as nursing, discussions with consultants, evaluation of patient's response to treatment, examination of patient, obtaining history from patient or surrogate, ordering and performing treatments and interventions, ordering and review of laboratory studies, ordering and review of radiographic studies, pulse oximetry and re-evaluation of patient's condition.   Richard [name] was  evaluated in Emergency Department on 03/04/2019 for the symptoms described in the history of present illness. He was evaluated in the context of the global COVID-19 pandemic, which necessitated consideration that the patient might be at risk for infection with the SARS-CoV-2 virus that causes COVID-19. Institutional protocols and algorithms that pertain to the evaluation of patients at risk for COVID-19 are in a state of rapid change based on information released by regulatory bodies including the CDC and federal and state organizations. These policies and algorithms were followed during the patient's care in the ED.  Patient was seen wearing N95, face shield, gloves, gown.    [place] Mcatee, Delice Bison, DO 03/04/19 [place], Delice Bison, DO 03/04/19 [phone]

## 2019-03-04 NOTE — Discharge Summary (Signed)
Discharge Summary  Richard [name] LKT:[id number] DOB: 10-Jan-1991  PCP: Patient, No Pcp Per  Admit date: 03/04/2019 Discharge date: 03/04/2019  Time spent: 40 mins  Recommendations for Outpatient Follow-up:  1. None- Signed AMA  Discharge Diagnoses:  Active Hospital Problems   Diagnosis Date Noted  . 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 TTP (thrombotic thrombopenic purpura) ([place]) 03/04/2019  . Headache 03/04/2019  . Anemia 03/04/2019  . Abnormal liver function 03/04/2019  . Thrombocytopenia [facility])     Resolved Hospital Problems   Diagnosis Date Noted Date Resolved  . T.T.P. syndrome ([place]) 03/04/2019 03/04/2019    Discharge Condition: Critical  Diet recommendation: None  Vitals:   03/04/19 1048 03/04/19 1053  BP: (!) 150/93 (!) 150/93  Pulse: (!) 115 (!) 115  Resp: 18 18  Temp: 98.9 F (37.2 C)   SpO2: 100% 99%    History of present illness:  Richard [name]  is a 29 y.o. male,  w sickle cell , h/o TTP, presents w c/o headache, and dyspnea and slight nausea since Monday. The headache is left frontal.   Pt states that these symptoms are similar to what he had when he had TTP. No new medications .  Pt last had follow up with Dr. Irene Limbo 12/24/2017 at which time his Wbc 7.9, Hgb 14.7, Plt 274. In the ED, VSS, labs wbc 12.6, Hgb 8.0, Plt 14, Na 137 , K 3.8, Bun 17, Creatinine 1.39 Ast 44, Alt 24, Alk phos 64, T. Bili 3.2. TTP suspected. Dr. Maylon Peppers consulted, looked up peripheral smear on call from TTP.  Discussed with dialysis center at [facility], unable to do plasmapheresis due to staffing issues.  Made arrangements for patient to be transferred to [facility] for plasmapheresis.  Transportation was arranged, shortly before transportation arrived patient left AMA without signing.  Attempted to call patient, no answer.  Discussed with his significant order, stated patient is insisting on leaving.  Left AMA.   Hospital Course:  Principal Problem:   TTP (thrombotic  thrombocytopenic purpura) (HCC) Active Problems:   Thrombocytopenia (HCC)   TTP (thrombotic thrombopenic purpura) (HCC)   Headache   Anemia   Abnormal liver function   TTP/HUS Presented with thrombocytopenia, AKI, elevated LDH, anemia, elevated T bili Verified by lab and peripheral smear by heme-onc CT head unremarkable Advised/arranged to be transferred to [facility] for plasmapheresis, but patient left AMA  Leukocytosis Likely reactive UA negative Chest x-ray unremarkable      Malnutrition Type:      Malnutrition Characteristics:      Nutrition Interventions:      Estimated body mass index is 37.1 kg/m as calculated from the following:   Height as of this encounter: '5\' 11"'$  (1.803 m).   Weight as of this encounter: 120.7 kg.    Procedures:  None  Consultations:  Heme-onc, Dr. Maylon Peppers  Discharge Exam: BP (!) 150/93   Pulse (!) 115   Temp 98.9 F (37.2 C)   Resp 18   Ht '5\' 11"'$  (1.803 m)   Wt 120.7 kg   SpO2 99%   BMI 37.10 kg/m   General: NAD Cardiovascular: S1, S2 present Respiratory: CTA B Abdomen: Soft, nontender, nondistended, bowel sounds present  Discharge Instructions You were cared for by a hospitalist during your hospital stay. If you have any questions about your discharge medications or the care you received while you were in the hospital after you are discharged, you can call the unit and asked to speak with the hospitalist on  call if the hospitalist that took care of you is not available. Once you are discharged, your primary care physician will handle any further medical issues. Please note that NO REFILLS for any discharge medications will be authorized once you are discharged, as it is imperative that you return to your primary care physician (or establish a relationship with a primary care physician if you do not have one) for your aftercare needs so that they can reassess your need for medications and monitor your lab values.    No  Known Allergies    The results of significant diagnostics from this hospitalization (including imaging, microbiology, ancillary and laboratory) are listed below for reference.    Significant Diagnostic Studies: CT HEAD WO CONTRAST  Result Date: 03/04/2019 CLINICAL DATA:  29 year old male with acute headache and low platelet count. EXAM: CT HEAD WITHOUT CONTRAST TECHNIQUE: Contiguous axial images were obtained from the base of the skull through the vertex without intravenous contrast. COMPARISON:  12/28/2017 CT FINDINGS: Brain: No evidence of acute infarction, hemorrhage, hydrocephalus, extra-axial collection or mass lesion/mass effect. Vascular: No hyperdense vessel or unexpected calcification. Skull: Normal. Negative for fracture or focal lesion. Sinuses/Orbits: No acute finding. Other: None. IMPRESSION: Unremarkable noncontrast head CT. Electronically Signed   By: Margarette Canada M.D.   On: 03/04/2019 07:35   DG Chest Port 1 View  Result Date: 03/04/2019 CLINICAL DATA:  Headache. EXAM: PORTABLE CHEST 1 VIEW COMPARISON:  02/12/2017 FINDINGS: The heart size is borderline enlarged. There is no focal infiltrate or large pleural effusion. There is no pneumothorax. There is no acute osseous abnormality. IMPRESSION: Borderline cardiomegaly without acute cardiopulmonary disease. Electronically Signed   By: Constance Holster M.D.   On: 03/04/2019 03:39    Microbiology: Recent Results (from the past 240 hour(s))  Respiratory Panel by RT PCR (Flu A&B, Covid) - Nasopharyngeal Swab     Status: None   Collection Time: 03/04/19  4:39 AM   Specimen: Nasopharyngeal Swab  Result Value Ref Range Status   SARS Coronavirus 2 by RT PCR NEGATIVE NEGATIVE Final    Comment: (NOTE) SARS-CoV-2 target nucleic acids are NOT DETECTED. The SARS-CoV-2 RNA is generally detectable in upper respiratoy specimens during the acute phase of infection. The lowest concentration of SARS-CoV-2 viral copies this assay can detect  is 131 copies/mL. A negative result does not preclude SARS-Cov-2 infection and should not be used as the sole basis for treatment or other patient management decisions. A negative result may occur with  improper specimen collection/handling, submission of specimen other than nasopharyngeal swab, presence of viral mutation(s) within the areas targeted by this assay, and inadequate number of viral copies (<131 copies/mL). A negative result must be combined with clinical observations, patient history, and epidemiological information. The expected result is Negative. Fact Sheet for Patients:  PinkCheek.be Fact Sheet for Healthcare Providers:  GravelBags.it This test is not yet ap proved or cleared by the [place] FDA and  has been authorized for detection and/or diagnosis of SARS-CoV-2 by FDA under an Emergency Use Authorization (EUA). This EUA will remain  in effect (meaning this test can be used) for the duration of the COVID-19 declaration under Section 564(b)(1) of the Act, 21 U.S.C. section 360bbb-3(b)(1), unless the authorization is terminated or revoked sooner.    Influenza A by PCR NEGATIVE NEGATIVE Final   Influenza B by PCR NEGATIVE NEGATIVE Final    Comment: (NOTE) The Xpert Xpress SARS-CoV-2/FLU/RSV assay is intended as an aid in  the diagnosis of influenza from  Nasopharyngeal swab specimens and  should not be used as a sole basis for treatment. Nasal washings and  aspirates are unacceptable for Xpert Xpress SARS-CoV-2/FLU/RSV  testing. Fact Sheet for Patients: PinkCheek.be Fact Sheet for Healthcare Providers: GravelBags.it This test is not yet approved or cleared by the [place] FDA and  has been authorized for detection and/or diagnosis of SARS-CoV-2 by  FDA under an Emergency Use Authorization (EUA). This EUA will remain  in effect (meaning this  test can be used) for the duration of the  Covid-19 declaration under Section 564(b)(1) of the Act, 21  U.S.C. section 360bbb-3(b)(1), unless the authorization is  terminated or revoked. Performed at [facility] [place], [place] [address] [place], [place] [address]      Labs: Basic Metabolic Panel: Recent Labs  Lab 03/04/19 0439  NA 137  K 3.8  CL 102  CO2 23  GLUCOSE 118*  BUN 17  CREATININE 1.39*  CALCIUM 8.5*   Liver Function Tests: Recent Labs  Lab 03/04/19 0439 03/04/19 0624  AST 44*  --   ALT 24  --   ALKPHOS 64  --   BILITOT 3.2* 3.6*  PROT 7.2  --   ALBUMIN 4.1  --    No results for input(s): LIPASE, AMYLASE in the last 168 hours. No results for input(s): AMMONIA in the last 168 hours. CBC: Recent Labs  Lab 03/04/19 0439  WBC 12.6*  NEUTROABS 8.7*  HGB 8.0*  HCT 23.3*  MCV 94.0  PLT 14*   Cardiac Enzymes: No results for input(s): CKTOTAL, CKMB, CKMBINDEX, TROPONINI in the last 168 hours. BNP: BNP (last 3 results) No results for input(s): BNP in the last 8760 hours.  ProBNP (last 3 results) No results for input(s): PROBNP in the last 8760 hours.  CBG: No results for input(s): GLUCAP in the last 168 hours.     Signed:  Alma Friendly, MD [facility] 03/04/2019, 3:14 PM

## 2019-03-04 NOTE — Consult Note (Signed)
Asked to see for possible HD catheter placement  Report given, has a bed at [facility] and is in the process of transfer   Proceed with facilitating transfer, catheter not placed at present

## 2019-03-04 NOTE — ED Notes (Signed)
Date and time results received: 03/04/19 5:14 AM   (use smartphrase ".now" to insert current time)  Test: Plt Critical Value: 14  Name of Provider Notified: Ward  Orders Received? Or Actions Taken?: Orders Received - See Orders for details

## 2019-03-04 NOTE — Consult Note (Signed)
Richard [name]  Patient Care Team: Patient, No Pcp Per as PCP - General (General Practice)  HEME/ONC OVERVIEW: 1. Hx of TTP  ASSESSMENT & PLAN:   Anemia, thrombocytopenia, AKI -I reviewed the patient's records in detail, including recent hematology clinic notes, lab studies, and imaging results -I also independently reviewed the radiologic measures of recent CT head, and agree with the findings documented -In summary, patient developed new onset headache and dyspnea with exertion x3 to 4 days, and he presented to the ER for further evaluation. In the ER, platelet count was 14k, hemoglobin 8.0, creatinine 1.39, T bili 3.6 with indirect bilirubin 3.2, and LDH 964, suspicious for relapsed TTP. CT head without contrast was negative for any acute intracranial finding. Covid test was negative. Oncology was consulted for further evaluation. -I personally reviewed the patient's peripheral blood smear, which showed reticulocytosis with scattered schistocytes and significantly decreased number of platelets. WBC morphology was relatively normal with some reactive appearing neutrophils. -I discussed the lab and pathology results in detail with the patient -Given the recurrent anemia, thrombocytopenia, AKI, hyperbilirubinemia, elevated LDH and schistocytes on peripheral blood smear, this is consistent with relapsed TTP, for which the treatment is plasma exchange -I have ordered ADAMTS13 level to confirm the diagnosis, but the results won't be available for several days  -I discussed the case with dialysis nurse at [facility], who informed me that they did not have staff certified for plasma exchange past 4PM today, and no staffing available for tomorrow -As TTP is hematologic emergency and requires daily plasma exchange, I reached out to Dr.  Jolyne Loa at [facility] regarding patient transfer, and the patient was accepted, pending bed availability -I also discussed the case at length with  Dr. Sharolyn Douglas of hospitalist medicine, and asked her to assist with dialysis catheter placement (if possible) prior to transfer. However, if dialysis catheter cannot be placed in time before the transfer, then the patient will have to have the procedure done at [facility] -Patient was in agreement with the plan  Thank you for the opportunity to participate in Richard [name]' care. Please do not hesitate to contact me if there are any questions.  Arthur Holms, MD 2/6/20218:24 AM  PURPOSE OF CONSULTATION:  Suspected recurrent TTP   HISTORY OF PRESENTING ILLNESS:  Richard [name] is a pleasant 29 yo African-American male with history of TTP in 2019 s/p plasma exchange who presented to the ED for persistent headache and shortness of breath. 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 In addition, he also has had intermittent frontal headache without any vision change, such as blurry vision or diplopia. He also has had some intermittent leg soreness. Given that the symptoms were similar to his first episode of TTP in 2019, the patient presented to the ER for further evaluation.  In the ER, platelet count was 14k, hemoglobin 8.0, creatinine 1.39, T bili 3.6 with indirect bilirubin 3.2, and LDH 964, suspicious for relapsed TTP. CT head without contrast was negative for any acute intracranial finding. Covid test was negative. Oncology was consulted for further evaluation.  REVIEW OF SYSTEMS:   Constitutional: ( - ) fevers, ( - )  chills , ( - ) night sweats Eyes: ( - ) blurriness of vision, ( - ) double vision, ( - ) watery eyes Ears, nose, mouth, throat, and face: ( - )  mucositis, ( - ) sore throat Respiratory: ( - ) cough, ( + ) dyspnea, ( - ) wheezes Cardiovascular: ( - ) palpitation, ( - ) chest discomfort, ( - ) lower extremity swelling Gastrointestinal:  ( - ) nausea, ( - ) heartburn, ( - )  change in bowel habits Skin: ( - ) abnormal skin rashes Lymphatics: ( - ) new lymphadenopathy, ( - ) easy bruising Neurological: ( - ) numbness, ( - ) tingling, ( - ) new weaknesses Behavioral/Psych: ( - ) mood change, ( - ) new changes  All other systems were reviewed with the patient and are negative.  I have reviewed his chart and materials related to his cancer extensively and collaborated history with the patient. Summary of oncologic history is as follows: Oncology History   No history exists.    MEDICAL HISTORY:  Past Medical History:  Diagnosis Date  . Sickle cell trait ([place])   . Thrombotic thrombocytopenic purpura (TTP) (H[facility])     SURGICAL HISTORY: History reviewed. No pertinent surgical history.  SOCIAL HISTORY: Social History   Socioeconomic History  . Marital status: Single    Spouse name: Not on file  . Number of children: Not on file  . Years of education: Not on file  . Highest education level: Not on file  Occupational History  . Not on file  Tobacco Use  . Smoking status: Former Smoker    Packs/day: 0.50    Types: Cigarettes    Quit date: 01/2017    Years since quitting: 2.1  . Smokeless tobacco: Never Used  Substance and Sexual Activity  . Alcohol use: No  . Drug use: Yes    Types: Marijuana  . Sexual activity: Not on file  Other Topics Concern  . Not on file  Social History Narrative  . Not on file   Social Determinants of Health   Financial Resource Strain:   . Difficulty of Paying Living Expenses: Not on file  Food Insecurity:   . Worried About Charity fundraiser in the Last Year: Not on file  . Ran Out of Food in the Last Year: Not on file  Transportation Needs:   . Lack of Transportation (Medical): Not on file  . Lack of Transportation (Non-Medical): Not on file  Physical Activity:   . Days of Exercise per Week: Not on file  . Minutes of Exercise per Session: Not on file  Stress:   . Feeling of Stress : Not on file  Social  Connections:   . Frequency of Communication with Friends and Family: Not on file  . Frequency of Social Gatherings with Friends and Family: Not on file  . Attends Religious Services: Not on file  . Active Member of Clubs or Organizations: Not on file  . Attends Archivist Meetings: Not on file  . Marital Status: Not on file  Intimate Partner Violence:   . Fear of Current or Ex-Partner: Not on file  . Emotionally Abused: Not on file  . Physically Abused: Not on file  . Sexually Abused: Not on file    FAMILY HISTORY: Family History  Problem Relation Age of Onset  . Cancer Other   . Diabetes Other   . Hypertension Other   . Diabetes Mother     ALLERGIES:  has No Known Allergies.  MEDICATIONS:  Current Facility-Administered Medications  Medication Dose Route Frequency Provider Last Rate Last Admin  . 0.9 %  sodium chloride infusion  250 mL Intravenous  PRN Pearson Grippe, MD      . acetaminophen (TYLENOL) tablet 650 mg  650 mg Oral Q6H PRN Pearson Grippe, MD       Or  . acetaminophen (TYLENOL) suppository 650 mg  650 mg Rectal Q6H PRN Pearson Grippe, MD      . hydrALAZINE (APRESOLINE) injection 10 mg  10 mg Intravenous Q6H PRN Pearson Grippe, MD      . ondansetron [facility]) injection 4 mg  4 mg Intravenous Q6H PRN Pearson Grippe, MD      . sodium chloride flush (NS) 0.9 % injection 3 mL  3 mL Intravenous Q12H Pearson Grippe, MD      . sodium chloride flush (NS) 0.9 % injection 3 mL  3 mL Intravenous PRN Pearson Grippe, MD      . traMADol Janean Sark) tablet 50 mg  50 mg Oral Q6H PRN Pearson Grippe, MD       Current Outpatient Medications  Medication Sig Dispense Refill  . Elderberry 575 MG/5ML SYRP Take 5 mLs by mouth daily.    . fluticasone (FLONASE) 50 MCG/ACT nasal spray Place 1 spray into both nostrils daily as needed for allergies or rhinitis.    . Multiple Vitamin (MULTIVITAMIN WITH MINERALS) TABS tablet Take 1 tablet by mouth daily.    Marland Kitchen Phenylephrine-DM-GG-APAP (TYLENOL COLD/FLU SEVERE)  5-10-200-325 MG TABS Take 2 tablets by mouth every 6 (six) hours as needed (cough).    . Potassium 99 MG TABS Take 99 mg by mouth daily.    . Zinc 25 MG TABS Take 25 mg by mouth daily.      PHYSICAL EXAMINATION: ECOG PERFORMANCE STATUS: 0 - Asymptomatic  Vitals:   03/04/19 0630 03/04/19 0700  BP: (!) 149/90 (!) 146/91  Pulse: 88 89  Resp: 19 16  Temp:    SpO2: 100% 98%   Filed Weights   03/04/19 0317  Weight: 266 lb (120.7 kg)    GENERAL: alert, no distress and comfortable in bed  SKIN: skin color, texture, turgor are normal, no rashes or significant lesions EYES: conjunctiva are pink and non-injected, sclera clear OROPHARYNX: no exudate, no erythema; lips, buccal mucosa, and tongue normal  NECK: supple, non-tender LUNGS: clear to auscultation with normal breathing effort HEART: regular rate & rhythm, no murmurs, no lower extremity edema ABDOMEN: soft, non-tender, non-distended, normal bowel sounds Musculoskeletal: no cyanosis of digits and no clubbing  PSYCH: alert & oriented x 3, fluent speech  LABORATORY DATA:  I have reviewed the data as listed Lab Results  Component Value Date   WBC 12.6 (H) 03/04/2019   HGB 8.0 (L) 03/04/2019   HCT 23.3 (L) 03/04/2019   MCV 94.0 03/04/2019   PLT 14 (LL) 03/04/2019   Lab Results  Component Value Date   NA 137 03/04/2019   K 3.8 03/04/2019   CL 102 03/04/2019   CO2 23 03/04/2019    RADIOGRAPHIC STUDIES: I have personally reviewed the radiological images as listed and agreed with the findings in the report. CT HEAD WO CONTRAST  Result Date: 03/04/2019 CLINICAL DATA:  29 year old male with acute headache and low platelet count. EXAM: CT HEAD WITHOUT CONTRAST TECHNIQUE: Contiguous axial images were obtained from the base of the skull through the vertex without intravenous contrast. COMPARISON:  12/28/2017 CT FINDINGS: Brain: No evidence of acute infarction, hemorrhage, hydrocephalus, extra-axial collection or mass lesion/mass  effect. Vascular: No hyperdense vessel or unexpected calcification. Skull: Normal. Negative for fracture or focal lesion. Sinuses/Orbits: No acute finding. Other: None. IMPRESSION: Unremarkable  noncontrast head CT. Electronically Signed   By: Harmon Pier M.D.   On: 03/04/2019 07:35   DG Chest Port 1 View  Result Date: 03/04/2019 CLINICAL DATA:  Headache. EXAM: PORTABLE CHEST 1 VIEW COMPARISON:  02/12/2017 FINDINGS: The heart size is borderline enlarged. There is no focal infiltrate or large pleural effusion. There is no pneumothorax. There is no acute osseous abnormality. IMPRESSION: Borderline cardiomegaly without acute cardiopulmonary disease. Electronically Signed   By: Katherine Mantle M.D.   On: 03/04/2019 03:39    PATHOLOGY: I have reviewed the pathology reports as documented in the oncologist history.

## 2019-03-04 NOTE — ED Notes (Signed)
Per [facility]; have [facility] Transport team transport. I have just spoken with Kenney Houseman at [facility], and they will me here asap. Pt. Remains in no distress. His girlfriend is visiting with him at this time. Both are aware of, and agree with, plan to admit to [facility] for plasma exchange therapy.

## 2019-03-06 MED ORDER — CAPLACIZUMAB-YHDP 11 MG IJ KIT
11.00 | PACK | INTRAMUSCULAR | Status: DC
Start: 2019-03-13 — End: 2019-03-06

## 2019-03-06 MED ORDER — PREDNISONE 50 MG PO TABS
100.00 | ORAL_TABLET | ORAL | Status: DC
Start: 2019-03-13 — End: 2019-03-06

## 2019-03-06 MED ORDER — FOLIC ACID 1 MG PO TABS
5.00 | ORAL_TABLET | ORAL | Status: DC
Start: 2019-03-14 — End: 2019-03-06

## 2019-03-06 MED ORDER — GENERIC EXTERNAL MEDICATION
Status: DC
Start: ? — End: 2019-03-06

## 2019-03-06 MED ORDER — DIPYRIDAMOLE 50 MG PO TABS
100.00 | ORAL_TABLET | ORAL | Status: DC
Start: 2019-03-08 — End: 2019-03-06

## 2019-03-06 MED ORDER — DEXTROSE 10 % IV SOLN
125.00 | INTRAVENOUS | Status: DC
Start: ? — End: 2019-03-06

## 2019-03-06 MED ORDER — HEPARIN SODIUM (PORCINE) 1000 UNIT/ML IJ SOLN
3000.00 | INTRAMUSCULAR | Status: DC
Start: ? — End: 2019-03-06

## 2019-03-06 MED ORDER — TRAMADOL HCL 50 MG PO TABS
50.00 | ORAL_TABLET | ORAL | Status: DC
Start: ? — End: 2019-03-06

## 2019-03-06 MED ORDER — METOPROLOL TARTRATE 25 MG PO TABS
25.00 | ORAL_TABLET | ORAL | Status: DC
Start: 2019-03-08 — End: 2019-03-06

## 2019-03-06 MED ORDER — ASPIRIN 325 MG PO TBEC
325.00 | DELAYED_RELEASE_TABLET | ORAL | Status: DC
Start: 2019-03-08 — End: 2019-03-06

## 2019-03-06 MED ORDER — INSULIN LISPRO 100 UNIT/ML ~~LOC~~ SOLN
2.00 | SUBCUTANEOUS | Status: DC
Start: 2019-03-13 — End: 2019-03-06

## 2019-03-06 MED ORDER — ACD-A NOCLOT-50 0.73-2.45-2.2 GM/100ML VI SOLN
1000.00 | Status: DC
Start: ? — End: 2019-03-06

## 2019-03-06 MED ORDER — CALCIUM GLUCONATE 10 % IV SOLN
1.00 | INTRAVENOUS | Status: DC
Start: ? — End: 2019-03-06

## 2019-03-06 MED ORDER — GLUCOSE 40 % PO GEL
15.00 | ORAL | Status: DC
Start: ? — End: 2019-03-06

## 2019-03-06 MED ORDER — MUPIROCIN 2 % EX OINT
TOPICAL_OINTMENT | CUTANEOUS | Status: DC
Start: 2019-03-08 — End: 2019-03-06

## 2019-03-06 MED ORDER — HEPARIN LOCK FLUSH 10 UNIT/ML IV SOLN
10.00 | INTRAVENOUS | Status: DC
Start: ? — End: 2019-03-06

## 2019-03-07 LAB — PATHOLOGIST SMEAR REVIEW

## 2019-03-08 MED ORDER — ONDANSETRON 8 MG PO TBDP
8.00 | ORAL_TABLET | ORAL | Status: DC
Start: ? — End: 2019-03-08

## 2019-03-08 MED ORDER — CALCIUM GLUCONATE 10 % IV SOLN
1.00 | INTRAVENOUS | Status: DC
Start: ? — End: 2019-03-08

## 2019-03-08 MED ORDER — NICOTINE 14 MG/24HR TD PT24
1.00 | MEDICATED_PATCH | TRANSDERMAL | Status: DC
Start: 2019-03-14 — End: 2019-03-08

## 2019-03-08 MED ORDER — ACD-A NOCLOT-50 0.73-2.45-2.2 GM/100ML VI SOLN
1000.00 | Status: DC
Start: ? — End: 2019-03-08

## 2019-03-10 LAB — ADAMTS13 ANTIBODY: ADAMTS13 Antibody: 48 Units/mL — ABNORMAL HIGH (ref <12)

## 2019-03-10 LAB — ADAMTS13 ACTIVITY: Adamts 13 Activity: <2 % — CL (ref >66.8)

## 2019-03-13 MED ORDER — MORPHINE SULFATE 4 MG/ML IJ SOLN
5.00 | INTRAMUSCULAR | Status: DC
Start: ? — End: 2019-03-13

## 2019-03-13 MED ORDER — TIZANIDINE HCL 4 MG PO TABS
4.00 | ORAL_TABLET | ORAL | Status: DC
Start: ? — End: 2019-03-13

## 2019-11-13 IMAGING — DX DG CHEST 1V PORT
1 series · 1 of 1 positions shown · non-contrast
Comparison: Earlier today

CLINICAL DATA: Catheter placement.

EXAM:
PORTABLE CHEST 1 VIEW

[chest ap]
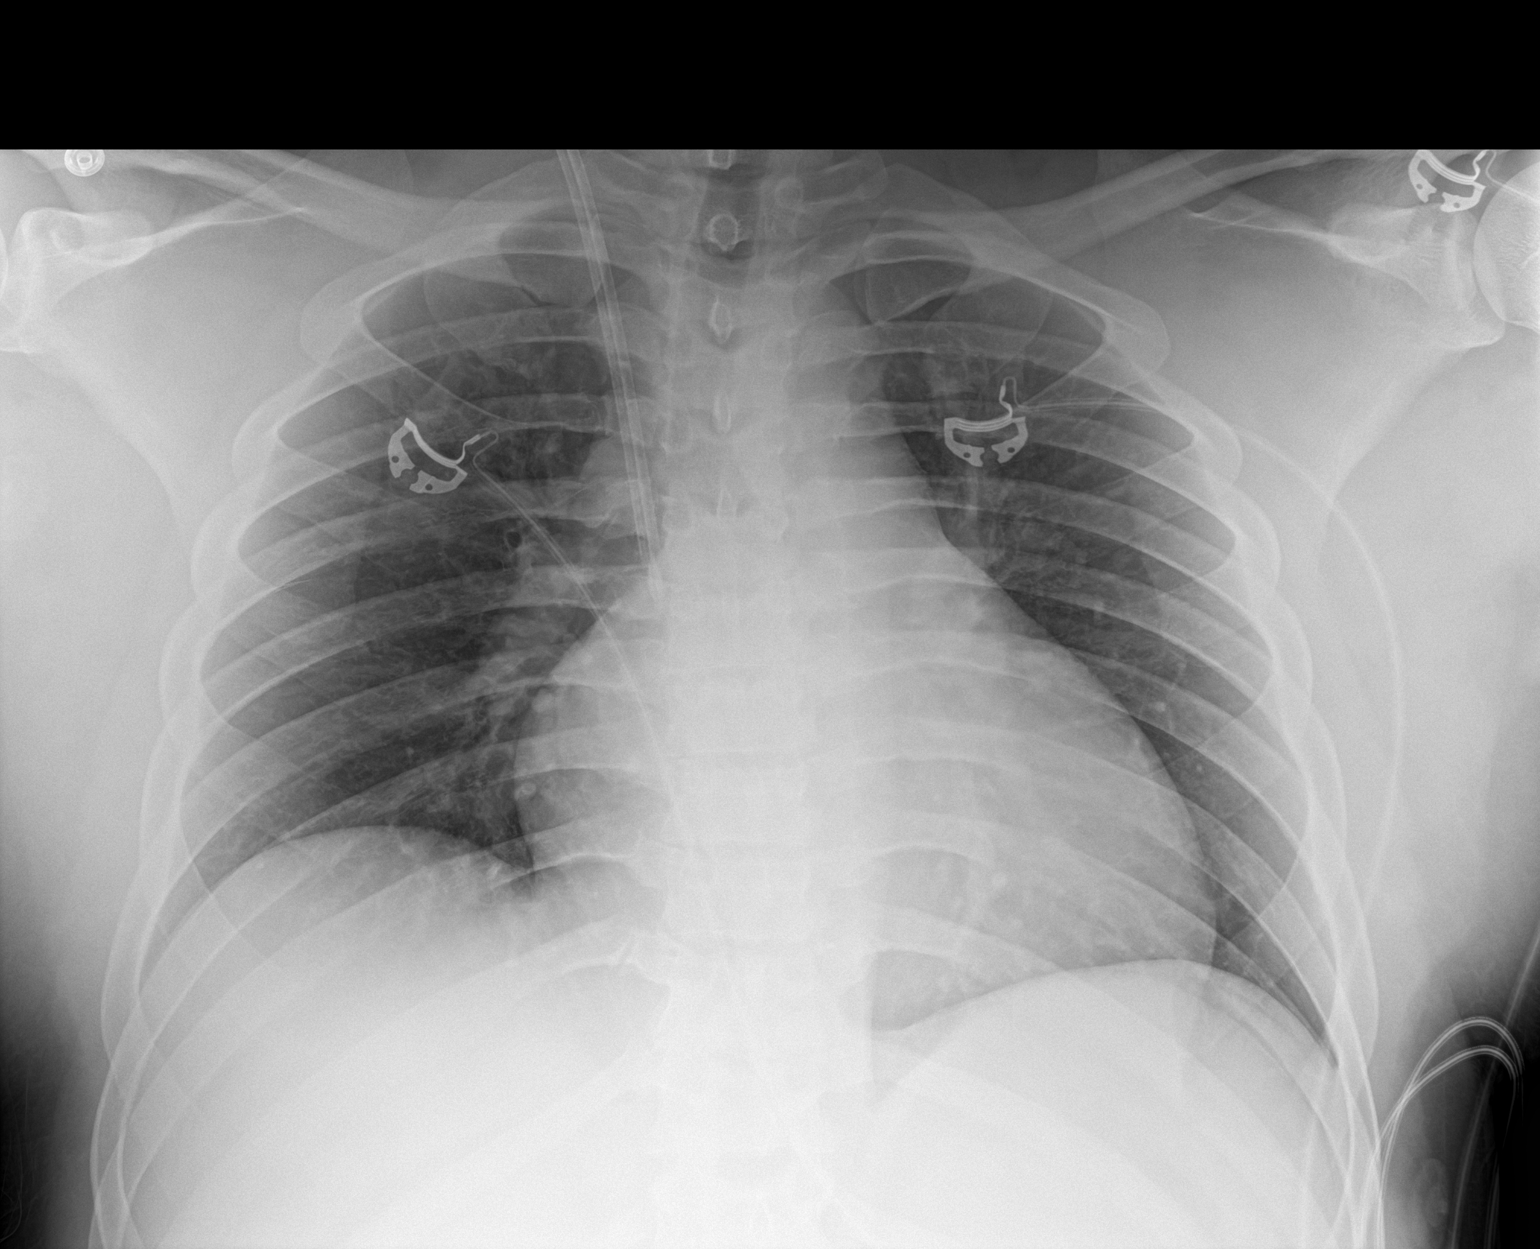

[1 of 1 positions shown; findings below may reference images not displayed]

FINDINGS: Interval placement of right IJ catheter with tip in the projection
of the distal SVC. No pneumothorax visualized. The heart size and
mediastinal contours are within normal limits. Both lungs are clear.
The visualized skeletal structures are unremarkable.
IMPRESSION: No active disease.

## 2019-11-13 IMAGING — CT CT HEAD W/O CM
3 series · 16 of 47 positions shown, 19 images · non-contrast
Comparison: None.

CLINICAL DATA: Right-sided numbness. History of sickle cell disease

EXAM:
CT HEAD WITHOUT CONTRAST
TECHNIQUE: Contiguous axial images were obtained from the base of the skull
through the vertex without intravenous contrast.

[Series 2: head wo · axial · 0.47mm/px · z∈[+20,+154]mm · 10 of 33 slices shown, 13 images]
[im 3/33  brain]
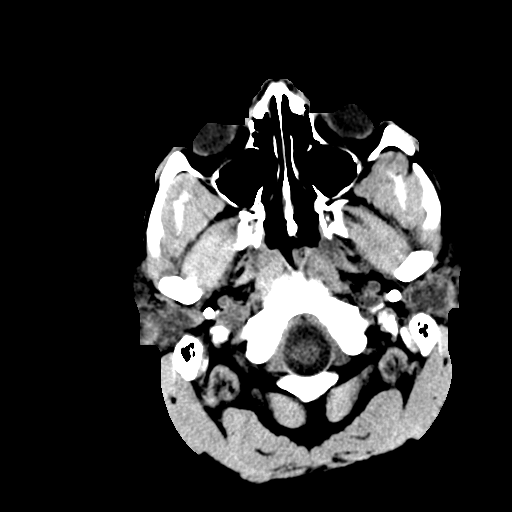
[im 3/33  bone]
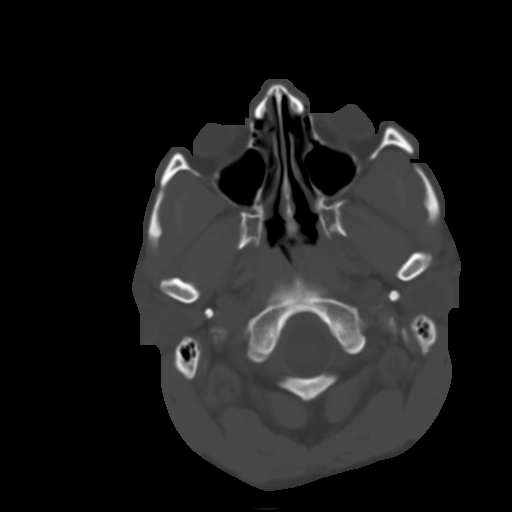
[im 6/33  brain]
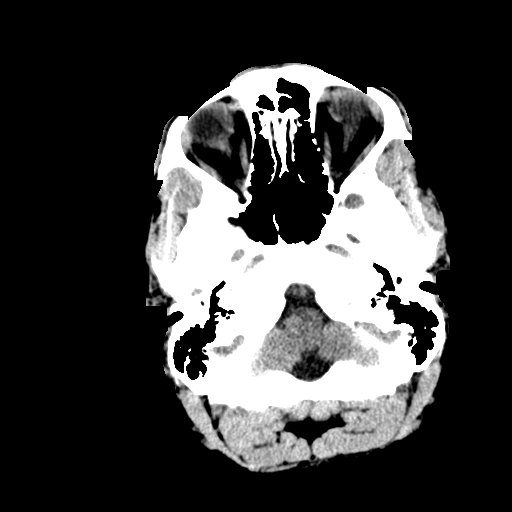
[im 9/33  brain]
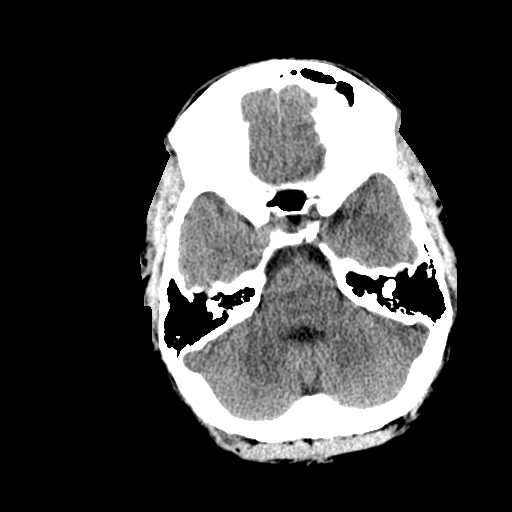
[im 12/33  brain]
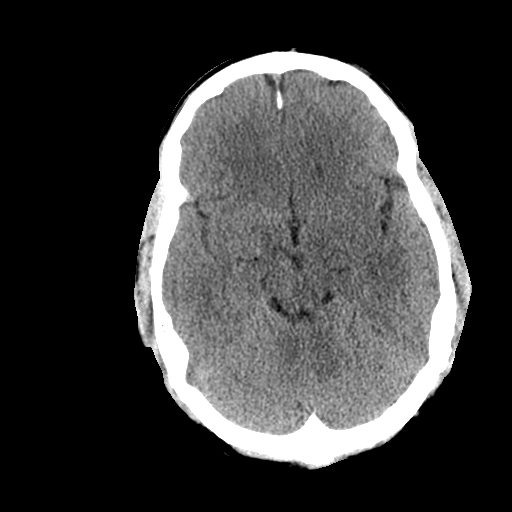
[im 15/33  brain]
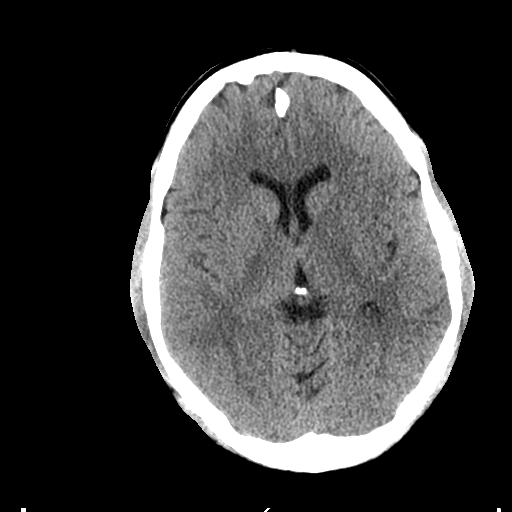
[im 15/33  bone]
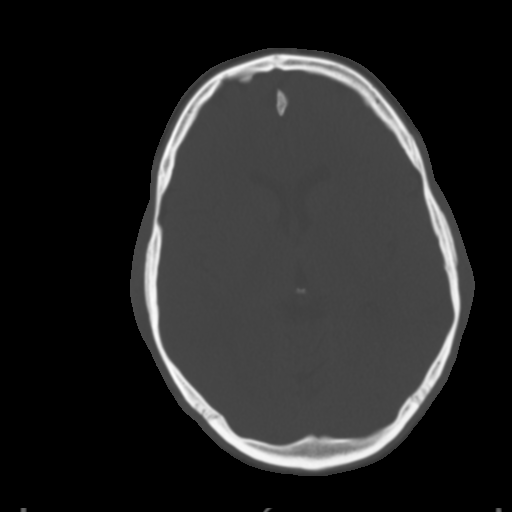
[im 18/33  brain]
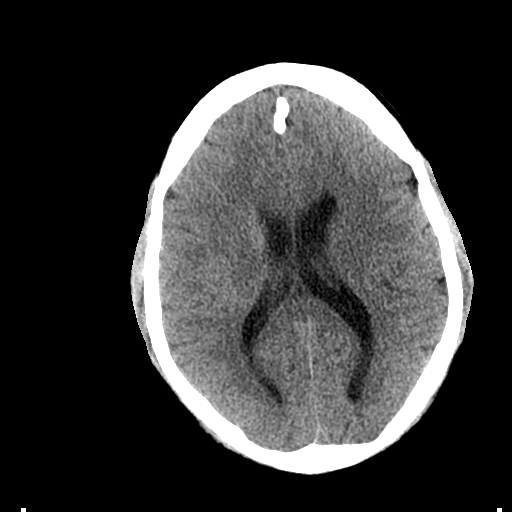
[im 21/33  brain]
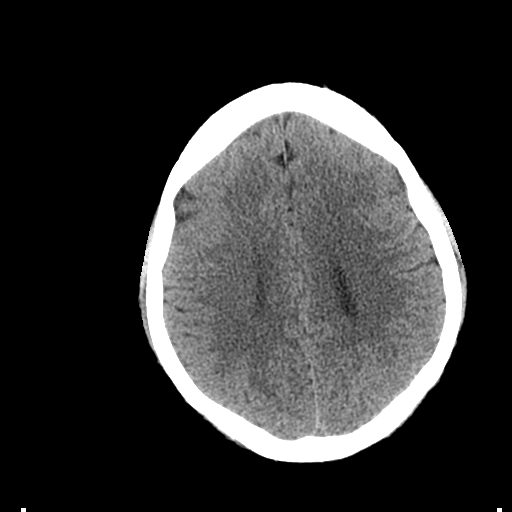
[im 25/33  brain]
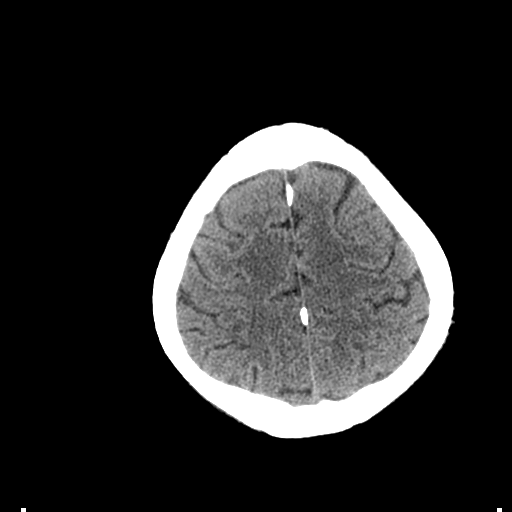
[im 27/33  brain]
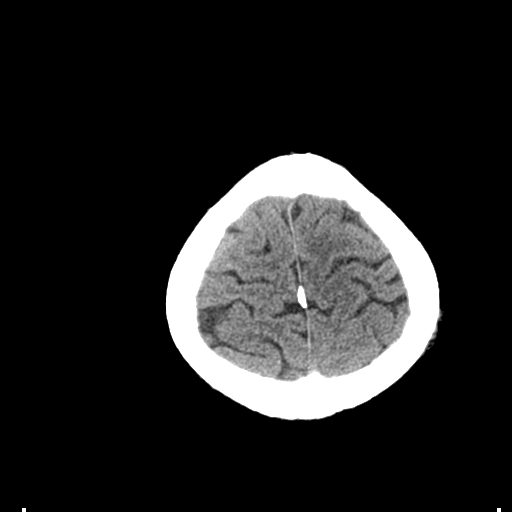
[im 27/33  bone]
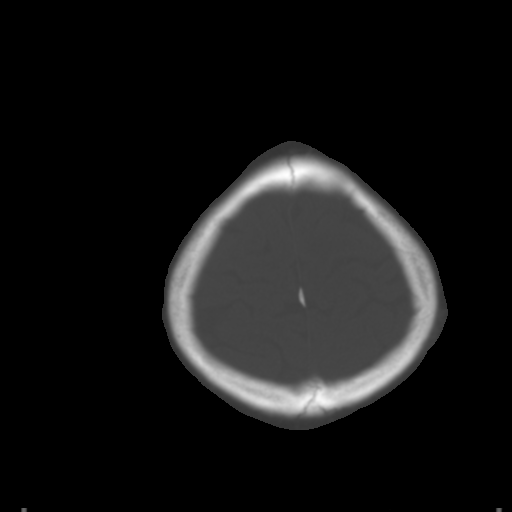
[im 30/33  brain]
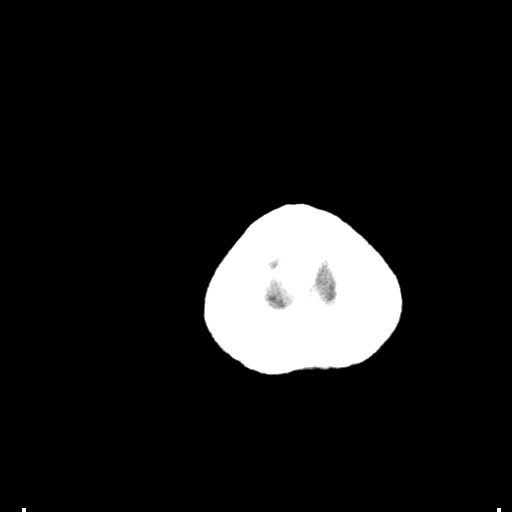

[Series 4: coronal soft tissue · coronal · 0.31mm/px · 3 of 66 slices shown]
[im 22/66  brain]
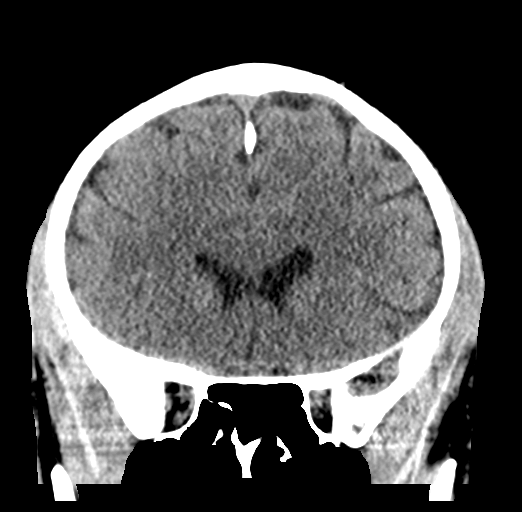
[im 29/66  brain]
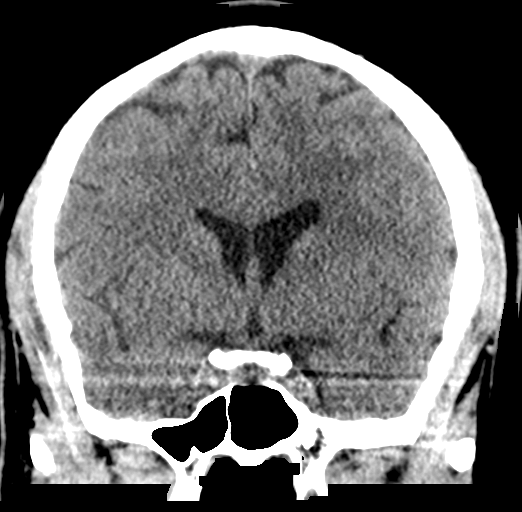
[im 37/66  brain]
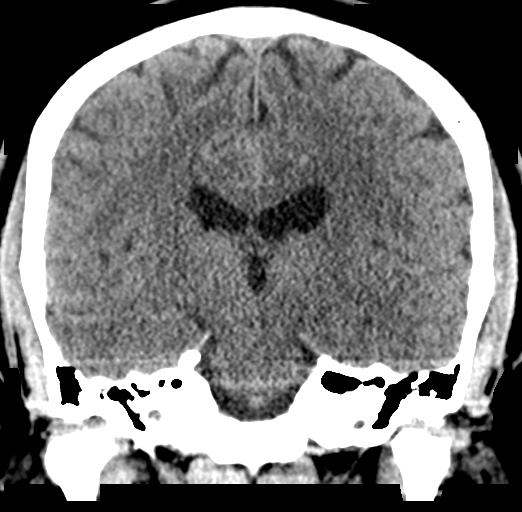

[Series 5: sagittal soft tissue · sagittal · 0.31mm/px · 3 of 56 slices shown]
[im 19/56  brain]
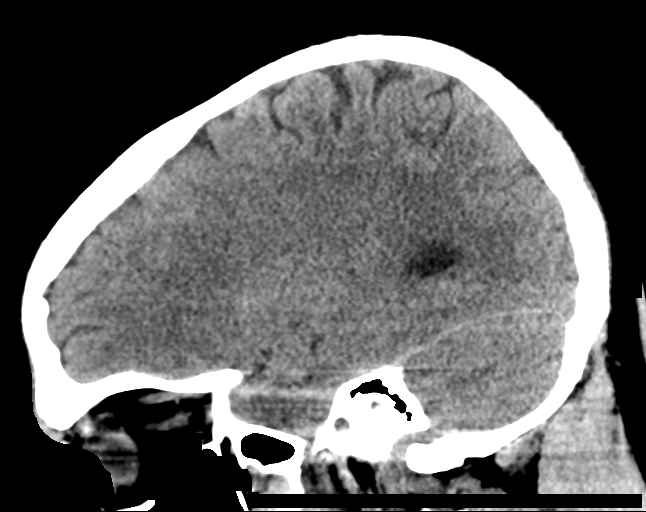
[im 28/56  brain]
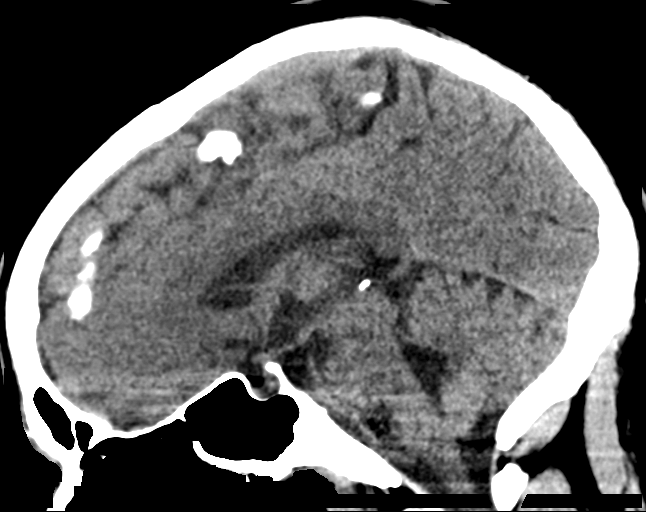
[im 37/56  brain]
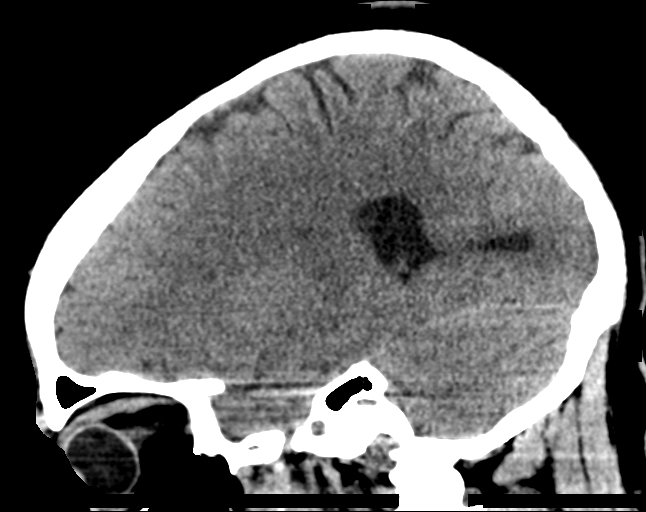

[16 of 47 positions shown; findings below may reference images not displayed]

FINDINGS: Brain: The ventricles are normal in size and configuration. There is
no intracranial mass, hemorrhage, extra-axial fluid collection, or
midline shift. Gray-white compartments are normal. No acute infarct
evident.

Vascular: No hyperdense vessels.  No vascular calcification evident.

Skull: The bony calvarium appears intact.

Sinuses/Orbits: Visualized paranasal sinuses are clear. Orbits
appear symmetric bilaterally.

Other: Mastoid air cells are clear.
IMPRESSION: Study within normal limits.

## 2019-11-13 IMAGING — CT CT ANGIO CHEST
2 of 6 series · 18 of 46 positions shown · IV contrast (iopamidol)
Comparison: Chest x-ray from same day.

CLINICAL DATA: Shortness of breath with exertion. Elevated D-dimer.
Evaluate for pulmonary embolism.

EXAM:
CT ANGIOGRAPHY CHEST WITH CONTRAST
TECHNIQUE: Multidetector CT imaging of the chest was performed using the
standard protocol during bolus administration of intravenous
contrast. Multiplanar CT image reconstructions and MIPs were
obtained to evaluate the vascular anatomy.
CONTRAST:  100mL 568XXP-Y7Y IOPAMIDOL (568XXP-Y7Y) INJECTION 76%

[Series 6: thins · axial · 0.74mm/px · z∈[-366,-101]mm · 15 of 291 slices shown]
[im 13/291  lung]
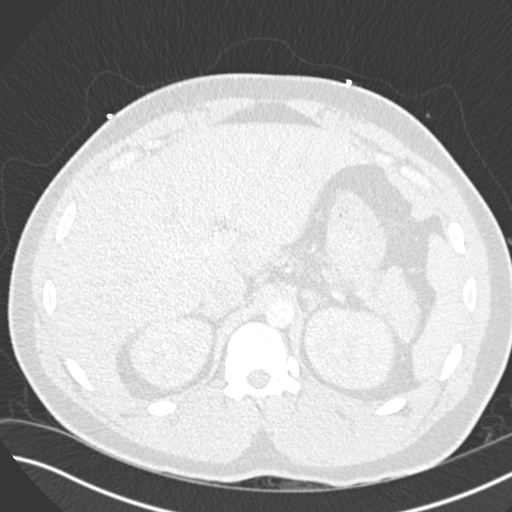
[im 38/291  soft-tissue]
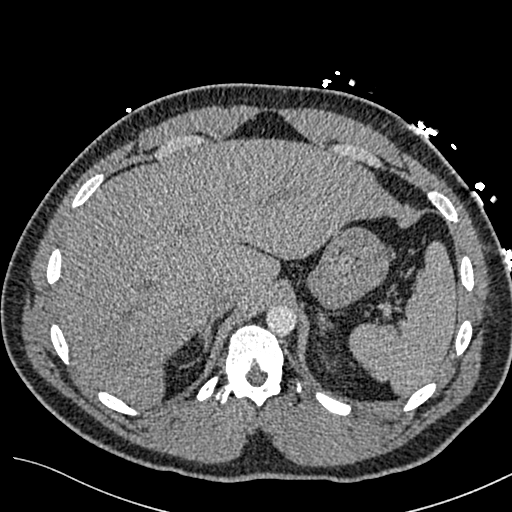
[im 51/291  lung]
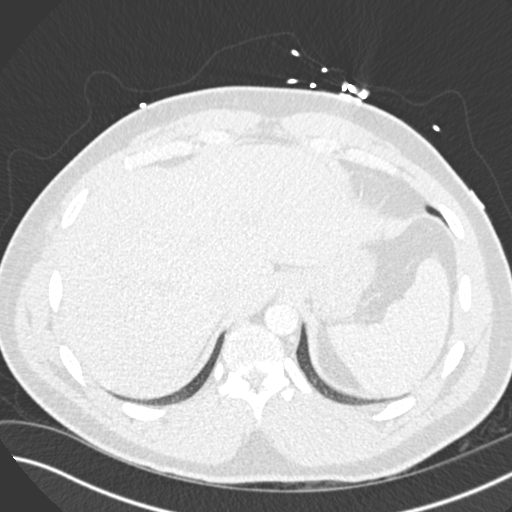
[im 76/291  soft-tissue]
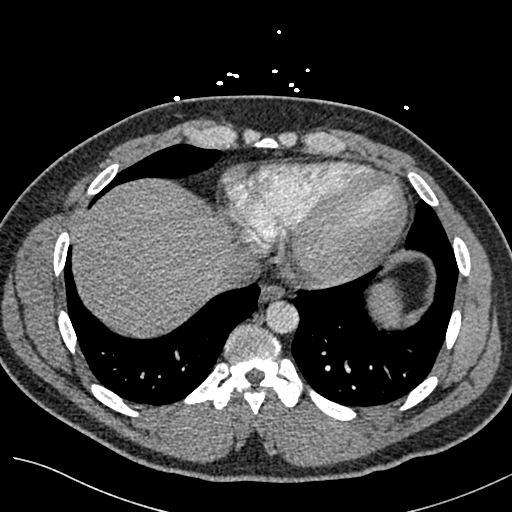
[im 89/291  lung]
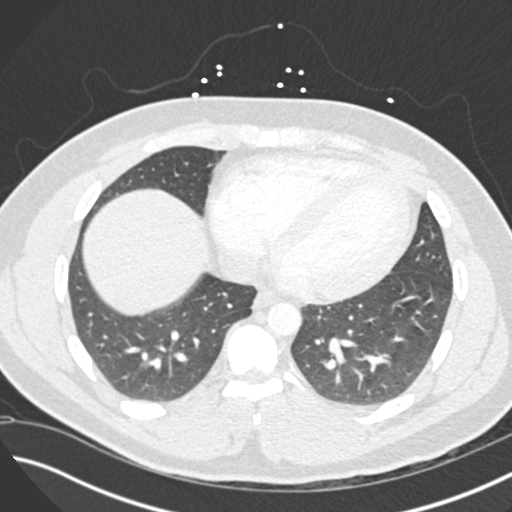
[im 114/291  soft-tissue]
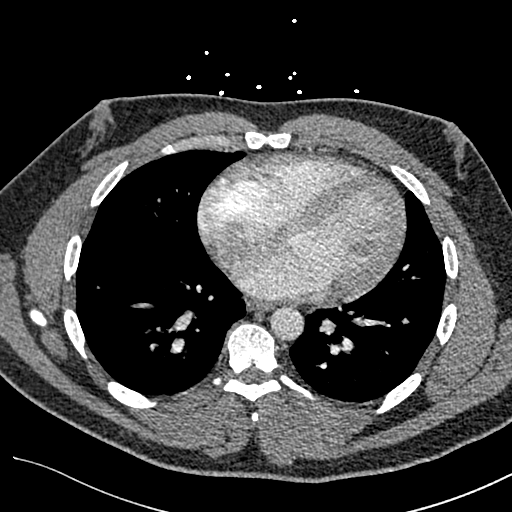
[im 127/291  lung]
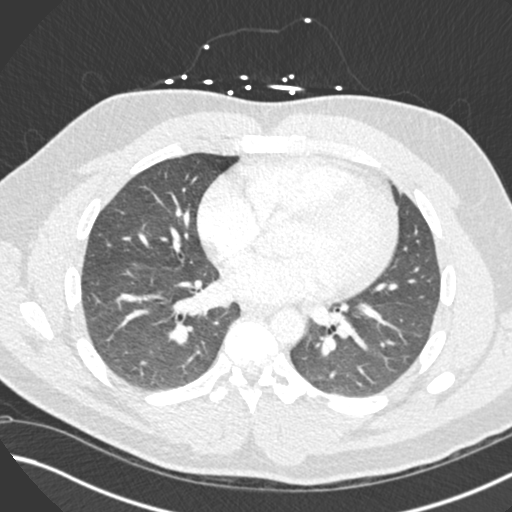
[im 152/291  soft-tissue]
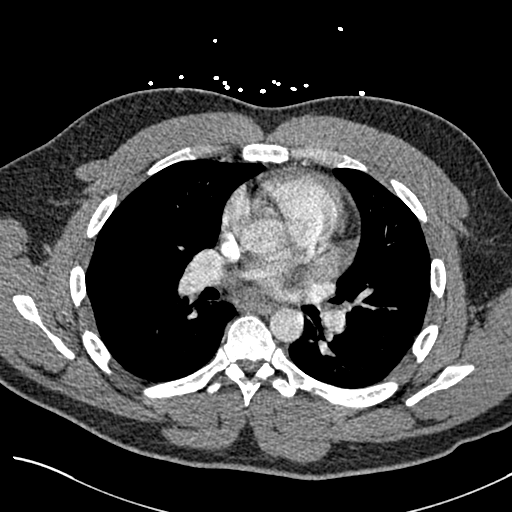
[im 164/291  lung]
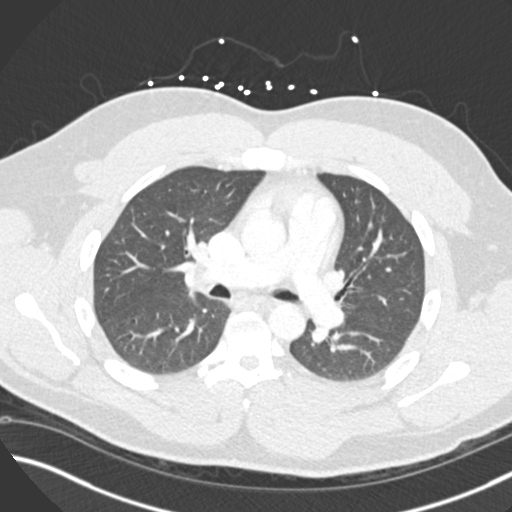
[im 177/291  soft-tissue]
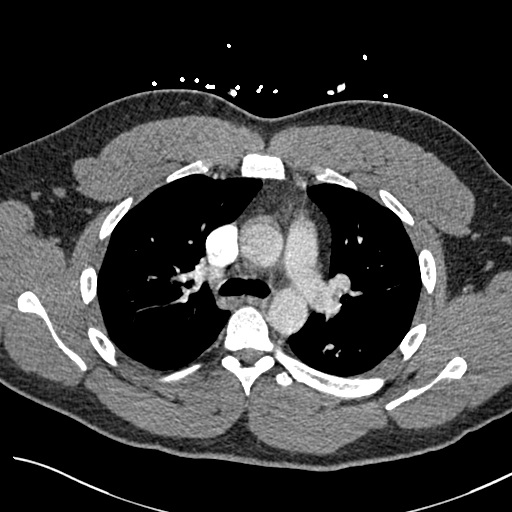
[im 202/291  lung]
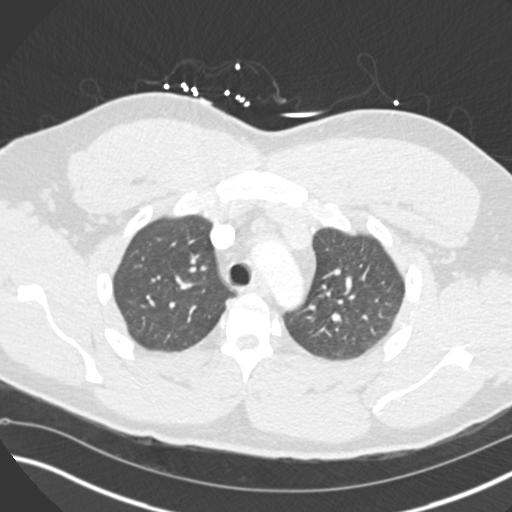
[im 215/291  soft-tissue]
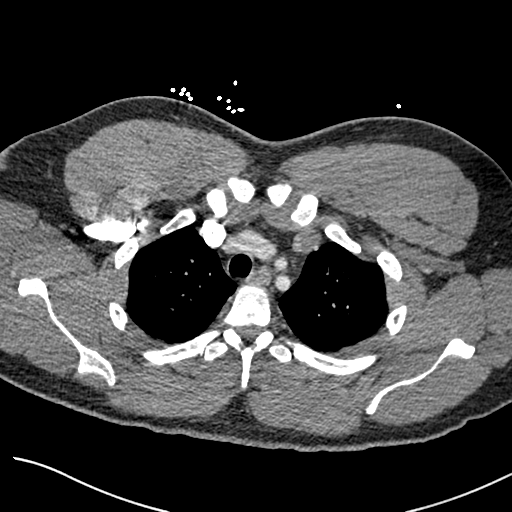
[im 240/291  lung]
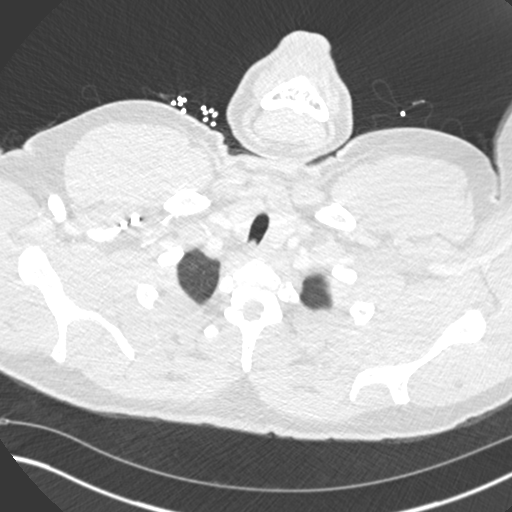
[im 253/291  soft-tissue]
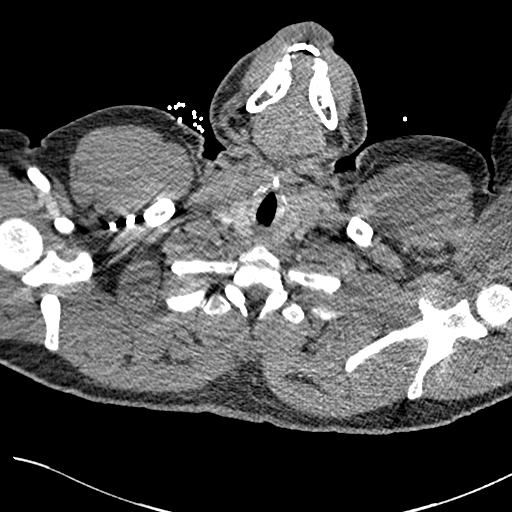
[im 278/291  lung]
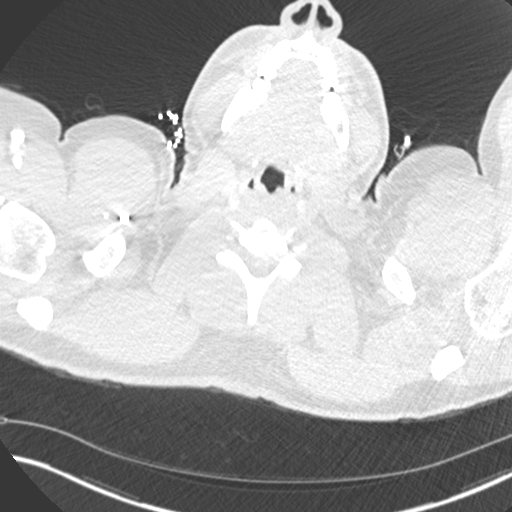

[Series 8: coronal mpr · coronal · 0.51mm/px · 3 of 146 slices shown]
[im 37/146  soft-tissue]
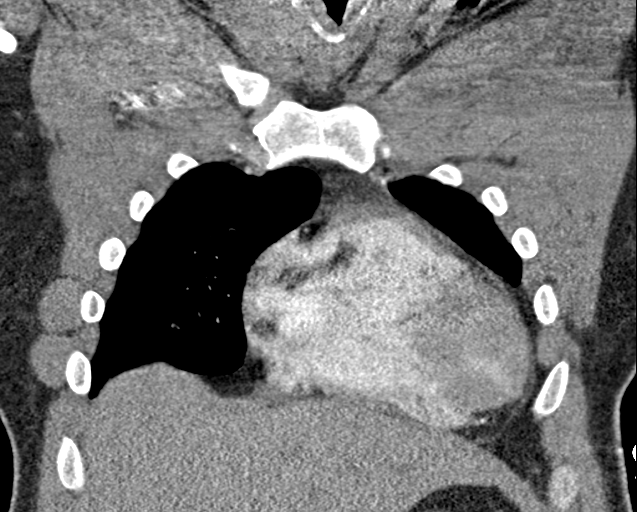
[im 73/146  soft-tissue]
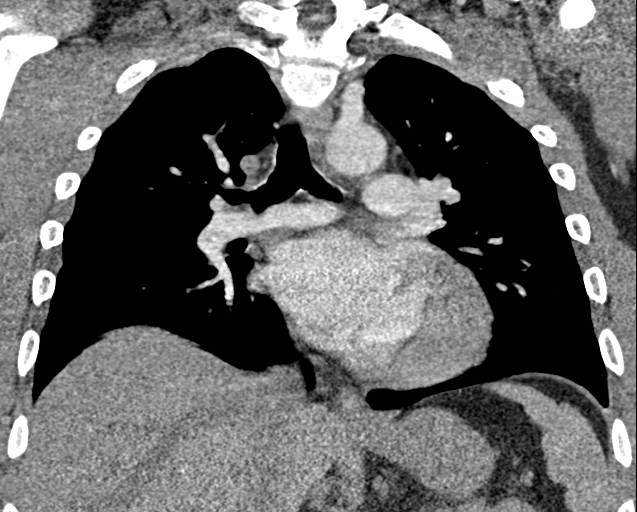
[im 109/146  soft-tissue]
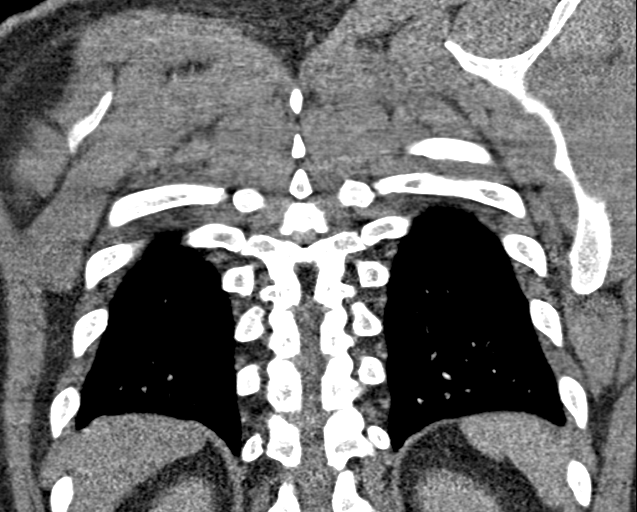

[18 of 46 positions shown; findings below may reference images not displayed]

FINDINGS: Cardiovascular: Suboptimal opacification of the pulmonary arteries
due to bolus timing and patient's body habitus. No central or lobar
pulmonary embolism. Normal heart size. No pericardial effusion.
Normal caliber thoracic aorta.

Mediastinum/Nodes: No enlarged mediastinal, hilar, or axillary lymph
nodes. Thyroid gland, trachea, and esophagus demonstrate no
significant findings.

Lungs/Pleura: Lungs are clear. No pleural effusion or pneumothorax.

Upper Abdomen: No acute abnormality.

Musculoskeletal: Bilateral gynecomastia. No acute or significant
osseous findings.

Review of the MIP images confirms the above findings.
IMPRESSION: 1. No central or lobar pulmonary embolism. The segmental pulmonary
arteries are not well evaluated due to bolus timing and patient's
body habitus.
2. No acute intrathoracic process.

## 2020-06-24 ENCOUNTER — Other Ambulatory Visit: Payer: Self-pay

## 2020-06-24 ENCOUNTER — Emergency Department (HOSPITAL_COMMUNITY)
Admission: EM | Admit: 2020-06-24 | Discharge: 2020-06-24 | Disposition: A | Discharge status: Home / Self Care | Payer: 59 | Attending: Emergency Medicine | Admitting: Emergency Medicine

## 2020-06-24 ENCOUNTER — Encounter (HOSPITAL_COMMUNITY): Payer: Self-pay

## 2020-06-24 DIAGNOSIS — R519 Headache, unspecified: Secondary | ICD-10-CM

## 2020-06-24 DIAGNOSIS — U071 COVID-19: Secondary | ICD-10-CM | POA: Insufficient documentation

## 2020-06-24 DIAGNOSIS — Z87891 Personal history of nicotine dependence: Secondary | ICD-10-CM | POA: Insufficient documentation

## 2020-06-24 LAB — COMPREHENSIVE METABOLIC PANEL
ALT: 40 U/L (ref 0–44)
AST: 52 U/L — ABNORMAL HIGH (ref 15–41)
Albumin: 4.7 g/dL (ref 3.5–5.0)
Alkaline Phosphatase: 68 U/L (ref 38–126)
Anion gap: 10 (ref 5–15)
BUN: 13 mg/dL (ref 6–20)
CO2: 28 mmol/L (ref 22–32)
Calcium: 9.5 mg/dL (ref 8.9–10.3)
Chloride: 98 mmol/L (ref 98–111)
Creatinine, Ser: 1.29 mg/dL — ABNORMAL HIGH (ref 0.61–1.24)
GFR, Estimated: >60 mL/min (ref >60)
Glucose, Bld: 95 mg/dL (ref 70–99)
Potassium: 4.1 mmol/L (ref 3.5–5.1)
Sodium: 136 mmol/L (ref 135–145)
Total Bilirubin: 1 mg/dL (ref 0.3–1.2)
Total Protein: 7.9 g/dL (ref 6.5–8.1)

## 2020-06-24 LAB — CBC WITH DIFFERENTIAL/PLATELET
Abs Immature Granulocytes: 0.05 10*3/uL (ref 0.00–0.07)
Basophils Absolute: 0.1 10*3/uL (ref 0.0–0.1)
Basophils Relative: 1 %
Eosinophils Absolute: 0 10*3/uL (ref 0.0–0.5)
Eosinophils Relative: 0 %
HCT: 42.6 % (ref 39.0–52.0)
Hemoglobin: 14.6 g/dL (ref 13.0–17.0)
Immature Granulocytes: 1 %
Lymphocytes Relative: 5 %
Lymphs Abs: 0.5 10*3/uL — ABNORMAL LOW (ref 0.7–4.0)
MCH: 31.5 pg (ref 26.0–34.0)
MCHC: 34.3 g/dL (ref 30.0–36.0)
MCV: 91.8 fL (ref 80.0–100.0)
Monocytes Absolute: 1.6 10*3/uL — ABNORMAL HIGH (ref 0.1–1.0)
Monocytes Relative: 18 %
Neutro Abs: 6.7 10*3/uL (ref 1.7–7.7)
Neutrophils Relative %: 75 %
Platelets: 280 10*3/uL (ref 150–400)
RBC: 4.64 MIL/uL (ref 4.22–5.81)
RDW: 12.3 % (ref 11.5–15.5)
WBC: 8.9 10*3/uL (ref 4.0–10.5)
nRBC: 0 % (ref 0.0–0.2)

## 2020-06-24 LAB — RESP PANEL BY RT-PCR (FLU A&B, COVID) ARPGX2
Influenza A by PCR: NEGATIVE
Influenza B by PCR: NEGATIVE
SARS Coronavirus 2 by RT PCR: POSITIVE — AB

## 2020-06-24 MED ORDER — ACETAMINOPHEN 500 MG PO TABS
1000.0000 mg | ORAL_TABLET | Freq: Once | ORAL | Status: AC
  Administered 2020-06-24: 1000 mg via ORAL
  Filled 2020-06-24: qty 2

## 2020-06-24 MED ORDER — NIRMATRELVIR/RITONAVIR (PAXLOVID)TABLET: 3.0000 | ORAL_TABLET | Freq: Two times a day (BID) | ORAL | 0 refills | Status: AC

## 2020-06-24 MED ORDER — DIPHENHYDRAMINE HCL 50 MG/ML IJ SOLN
25.0000 mg | Freq: Once | INTRAMUSCULAR | Status: AC
  Administered 2020-06-24: 25 mg via INTRAVENOUS
  Filled 2020-06-24: qty 1

## 2020-06-24 MED ORDER — METOCLOPRAMIDE HCL 5 MG/ML IJ SOLN
10.0000 mg | Freq: Once | INTRAMUSCULAR | Status: AC
  Administered 2020-06-24: 10 mg via INTRAVENOUS
  Filled 2020-06-24: qty 2

## 2020-06-24 NOTE — ED Provider Notes (Signed)
[facility]-EMERGENCY DEPT Provider Note   CSN: [id number] Arrival date & time: 06/24/20  1229     History Chief Complaint  Patient presents with  . Generalized Body Aches  . Fatigue    Richard [name] is a 30 y.o. male.  Patient with history of thrombotic thrombocytopenic purpura, not currently on any medications for this presents to the emergency department today for headache, body aches, fatigue.  Symptoms started late last evening.  No known sick contacts.  No associated ear pain, runny nose, sore throat.  No nausea, vomiting, diarrhea.  No chest pain or cough.  No shortness of breath.  Patient has body aches that are worse in his lower back and legs.  Symptoms are not typical for his TTP.  With that he typically will get petechiae which he has not noticed here.  No tick bites or other rashes.  Headache is frontal.  He denies head injury.  No confusion.  He has had similar headaches in the past.  The onset of this condition was acute. The course is constant. Aggravating factors: none. Alleviating factors: none.          Past Medical History:  Diagnosis Date  . Sickle cell trait (HCC)   . Thrombotic thrombocytopenic purpura (TTP)     Patient Active Problem List   Diagnosis Date Noted  . TTP (thrombotic thrombopenic purpura) 03/04/2019  . Headache 03/04/2019  . Anemia 03/04/2019  . Abnormal liver function 03/04/2019  . TTP (thrombotic thrombocytopenic purpura) 02/12/2017  . Sickle cell anemia (HCC) 02/12/2017  . Acute right hemiparesis (HCC)   . MAHA (microangiopathic hemolytic anemia) (HCC)   . Thrombocytopenia (HCC)   . Encounter for central line care   . Indirect hyperbilirubinemia 10/08/2015    History reviewed. No pertinent surgical history.     Family History  Problem Relation Age of Onset  . Cancer Other   . Diabetes Other   . Hypertension Other   . Diabetes Mother     Social History   Tobacco Use  . Smoking status:  Former Smoker    Packs/day: 0.50    Types: Cigarettes    Quit date: 01/2017    Years since quitting: 3.4  . Smokeless tobacco: Never Used  Vaping Use  . Vaping Use: Former  Substance Use Topics  . Alcohol use: No  . Drug use: Yes    Types: Marijuana    Home Medications Prior to Admission medications   Medication Sig Start Date End Date Taking? Authorizing Provider  Elderberry 575 MG/5ML SYRP Take 5 mLs by mouth daily.    [provider]  fluticasone (FLONASE) 50 MCG/ACT nasal spray Place 1 spray into both nostrils daily as needed for allergies or rhinitis.    [provider]  Multiple Vitamin (MULTIVITAMIN WITH MINERALS) TABS tablet Take 1 tablet by mouth daily.    [provider]  Phenylephrine-DM-GG-APAP (TYLENOL COLD/FLU SEVERE) 5-10-200-325 MG TABS Take 2 tablets by mouth every 6 (six) hours as needed (cough).    [provider]  Potassium 99 MG TABS Take 99 mg by mouth daily.    [provider]  Zinc 25 MG TABS Take 25 mg by mouth daily.    [provider]    Allergies    Patient has no known allergies.  Review of Systems   Review of Systems  Constitutional: Positive for fatigue. Negative for fever.  HENT: Negative for congestion, dental problem, rhinorrhea and sinus pressure.   Eyes:  Negative for photophobia, discharge, redness and visual disturbance.  Respiratory: Negative for shortness of breath.   Cardiovascular: Negative for chest pain.  Gastrointestinal: Negative for nausea and vomiting.  Musculoskeletal: Positive for myalgias. Negative for gait problem, neck pain and neck stiffness.  Skin: Negative for rash.  Neurological: Positive for headaches. Negative for syncope, speech difficulty, weakness, light-headedness and numbness.  Psychiatric/Behavioral: Negative for confusion.    Physical Exam Updated Vital Signs BP 136/77   Pulse 93   Temp 99.2 F (37.3 C) (Oral)   Resp 14   SpO2 99%   Physical  Exam Vitals and nursing note reviewed.  Constitutional:      Appearance: He is well-developed.  HENT:     Head: Normocephalic and atraumatic.     Right Ear: Tympanic membrane, ear canal and external ear normal.     Left Ear: Tympanic membrane, ear canal and external ear normal.     Nose: Nose normal.     Mouth/Throat:     Pharynx: Uvula midline.  Eyes:     General: Lids are normal.        Right eye: No discharge.        Left eye: No discharge.     Conjunctiva/sclera: Conjunctivae normal.     Pupils: Pupils are equal, round, and reactive to light.  Neck:     Comments: No pain with movement of the neck or apparent meningismus. Cardiovascular:     Rate and Rhythm: Normal rate and regular rhythm.     Heart sounds: Normal heart sounds.  Pulmonary:     Effort: Pulmonary effort is normal.     Breath sounds: Normal breath sounds.  Abdominal:     Palpations: Abdomen is soft.     Tenderness: There is no abdominal tenderness.  Musculoskeletal:        General: Normal range of motion.     Cervical back: Normal range of motion and neck supple. No tenderness or bony tenderness.  Skin:    General: Skin is warm and dry.  Neurological:     Mental Status: He is alert and oriented to person, place, and time.     GCS: GCS eye subscore is 4. GCS verbal subscore is 5. GCS motor subscore is 6.     Cranial Nerves: No cranial nerve deficit.     Sensory: No sensory deficit.     Motor: No abnormal muscle tone.     Coordination: Coordination normal.     Gait: Gait normal.     Deep Tendon Reflexes: Reflexes are normal and symmetric.     ED Results / Procedures / Treatments   Labs (all labs ordered are listed, but only abnormal results are displayed) Labs Reviewed  RESP PANEL BY RT-PCR (FLU A&B, COVID) ARPGX2 - Abnormal; Notable for the following components:      Result Value   SARS Coronavirus 2 by RT PCR POSITIVE (*)    All other components within normal limits  CBC WITH DIFFERENTIAL/PLATELET  - Abnormal; Notable for the following components:   Lymphs Abs 0.5 (*)    Monocytes Absolute 1.6 (*)    All other components within normal limits  COMPREHENSIVE METABOLIC PANEL - Abnormal; Notable for the following components:   Creatinine, Ser 1.29 (*)    AST 52 (*)    All other components within normal limits    EKG None  Radiology No results found.  Procedures Procedures   Medications Ordered in ED Medications  acetaminophen (TYLENOL) tablet 1,000 mg (  has no administration in time range)  metoCLOPramide (REGLAN) injection 10 mg (10 mg Intravenous Given 06/24/20 1343)  diphenhydrAMINE (BENADRYL) injection 25 mg (25 mg Intravenous Given 06/24/20 1343)    ED Course  I have reviewed the triage vital signs and the nursing notes.  Pertinent labs & imaging results that were available during my care of the patient were reviewed by me and considered in my medical decision making (see chart for details).  Patient seen and examined.  Will check labs, COVID test given history of TTP.  Will treat headache with migraine cocktail.  No signs of meningismus on exam.  He appears nontoxic.  Will need reassessment.  Reviewed previous hematology notes from [facility].  Vital signs reviewed and are as follows: BP 136/77   Pulse 93   Temp 99.2 F (37.3 C) (Oral)   Resp 14   SpO2 99%   3:02 PM patient is COVID-positive.  I went back to the room to update him, he is tachycardic into the low 100s.  I rechecked his temperature and it was 101.7 F.  Tylenol ordered.  Labs however are reassuring.  No evidence of recurrent TTP today.  Patient be discharged home.  As he is not vaccinated, discussed risks and benefits of antiviral therapy for COVID.  He is in the window.  He is interested.  Detailed discussion had with with patient regarding COVID-19 precautions and written instructions given as well.  We discussed need to isolate themselves for 5 days from onset of symptoms and have 24 hours of  improvement prior to breaking isolation.  We discussed that when breaking isolation, mask wearing for 5 additional days is required.  We discussed signs symptoms to return which include worsening shortness of breath, trouble breathing, or increased work of breathing.  Also return with persistent vomiting, confusion, passing out, or if they have any other concerns. Counseled on the need for rest and good hydration. Discussed that high-risk contacts should be aware of positive result and they need to quarantine and be tested if they develop any symptoms. Patient verbalizes understanding.   Richard [name] was evaluated in Emergency Department on 06/24/2020 for the symptoms described in the history of present illness. He was evaluated in the context of the global COVID-19 pandemic, which necessitated consideration that the patient might be at risk for infection with the SARS-CoV-2 virus that causes COVID-19. Institutional protocols and algorithms that pertain to the evaluation of patients at risk for COVID-19 are in a state of rapid change based on information released by regulatory bodies including the CDC and federal and state organizations. These policies and algorithms were followed during the patient's care in the ED.    MDM Rules/Calculators/A&P                          Patient with generalized body aches, fatigue, headache.  No neurological findings or concern for meningitis.  Spiked a temperature to 101 while in the ED.  COVID-positive.  No significant respiratory distress.  Labs checked given history of TTP, reassuring.  Given prescription for antiviral medication given that he is not vaccinated.  Instructions as above.   Final Clinical Impression(s) / ED Diagnoses Final diagnoses:  COVID-19  Acute nonintractable headache, unspecified headache type    Rx / DC Orders ED Discharge Orders         Ordered    nirmatrelvir/ritonavir EUA (PAXLOVID) TABS  2 times daily  06/24/20 1522            Renne CriglerGeiple, Najib Colmenares, PA-C 06/24/20 1527    Lorre NickAllen, Anthony, MD 06/28/20 [phone]

## 2020-06-24 NOTE — Discharge Instructions (Signed)
Please read and follow all provided instructions.  Your diagnoses today include:  1. COVID-19   2. Acute nonintractable headache, unspecified headache type     Tests performed today include:  Vital signs. See below for your results today.   COVID test - positive Blood cell counts (white, red, and platelets) - normal platelets Electrolytes  Kidney function test  Medications prescribed:   Paxlovid - antiviral for covid-19  Take any prescribed medications only as directed. Treatment for your infection is aimed at treating and shortening the symptoms.   Home care instructions:  Follow any educational materials contained in this packet.   Your illness is contagious and can be spread to others, especially during the first 3 or 4 days. It cannot be cured by antibiotics or other medicines. Take basic precautions such as washing your hands often, covering your mouth when you cough or sneeze, and avoiding public places where you could spread your illness to others.   Please continue drinking plenty of fluids.  Use over-the-counter medicines as needed as directed on packaging for symptom relief.  You may also use ibuprofen or tylenol as directed on packaging for pain or fever.  Do not take multiple medicines containing Tylenol or acetaminophen to avoid taking too much of this medication.  If you are positive for Covid-19, you should isolate yourself and not be exposed to other people for 5 days after your symptoms began. If you are not feeling better at day 5, you need to isolate yourself for a total of 10 days. If you are feeling better by day 5, you should wear a mask properly, over your nose and mouth, at all times while around other people until 10 days after your symptoms started.   Follow-up instructions: Please follow-up with your primary care provider as needed for further evaluation of your symptoms if you are not feeling better.   Return instructions:   Please return to the  Emergency Department if you experience worsening symptoms.   Return to the emergency department if you have worsening shortness of breath breathing or increased work of breathing, persistent vomiting  RETURN IMMEDIATELY IF you develop shortness of breath, confusion or altered mental status, a new rash, become dizzy, faint, or poorly responsive, or are unable to be cared for at home.  Please return if you have persistent vomiting and cannot keep down fluids or develop a fever that is not controlled by tylenol or motrin.    Please return if you have any other emergent concerns.  Additional Information:  Your vital signs today were: BP (!) 146/86   Pulse 96   Temp (!) 101.7 F (38.7 C) (Oral)   Resp (!) 24   SpO2 95%  If your blood pressure (BP) was elevated above 135/85 this visit, please have this repeated by your doctor within one month. --------------

## 2020-06-24 NOTE — ED Triage Notes (Signed)
Pt reports generalized body aches and fatigue that began last night. He states he developed a headache this morning.

## 2022-05-09 ENCOUNTER — Encounter (HOSPITAL_COMMUNITY): Payer: Self-pay

## 2022-05-09 ENCOUNTER — Emergency Department (HOSPITAL_COMMUNITY)
Admission: EM | Admit: 2022-05-09 | Discharge: 2022-05-09 | Disposition: A | Discharge status: Home / Self Care | Payer: 59 | Attending: Emergency Medicine | Admitting: Emergency Medicine

## 2022-05-09 DIAGNOSIS — J029 Acute pharyngitis, unspecified: Secondary | ICD-10-CM | POA: Insufficient documentation

## 2022-05-09 LAB — GROUP A STREP BY PCR: Group A Strep by PCR: NOT DETECTED

## 2022-05-09 MED ORDER — METHYLPREDNISOLONE 4 MG PO TABS
6.0000 mg | ORAL_TABLET | Freq: Once | ORAL | Status: AC
  Administered 2022-05-09: 6 mg via ORAL
  Filled 2022-05-09: qty 3

## 2022-05-09 MED ORDER — ACETAMINOPHEN 500 MG PO TABS
1000.0000 mg | ORAL_TABLET | Freq: Once | ORAL | Status: AC
  Administered 2022-05-09: 1000 mg via ORAL
  Filled 2022-05-09: qty 2

## 2022-05-09 MED ORDER — LIDOCAINE VISCOUS HCL 2 % MT SOLN
15.0000 mL | Freq: Once | OROMUCOSAL | Status: AC
  Administered 2022-05-09: 15 mL via OROMUCOSAL
  Filled 2022-05-09: qty 15

## 2022-05-09 MED ORDER — METHYLPREDNISOLONE 4 MG PO TABS: 4.0000 mg | ORAL_TABLET | Freq: Every day | ORAL | 0 refills | Status: AC

## 2022-05-09 NOTE — ED Provider Notes (Signed)
[place] EMERGENCY DEPARTMENT AT [facility] Provider Note   CSN: [id number] Arrival date & time: 05/09/22  3893     History  Chief Complaint  Patient presents with   Sore Throat   HPI Richard [name] is a 32 y.o. male with sickle cell anemia, history of TTP, and recent history of tonsillitis presenting for sore throat.  States symptoms started yesterday.  Endorses pain with swallowing.  Denies trouble breathing.  States has had congestion but no cough or fever.  States he was recently diagnosed with tonsillitis in March and was started on steroids and antibiotics.  States he did not complete his antibiotics, stating that "life just got too busy".  States symptoms are similar today.  His fiance mention that this has been a recurrent problem that has been going on for several years.  Patient has never been seen by ENT.   Sore Throat       Home Medications Prior to Admission medications   Medication Sig Start Date End Date Taking? Authorizing Provider  methylPREDNISolone (MEDROL) 4 MG tablet Take 1 tablet (4 mg total) by mouth daily for 5 days. 05/09/22 05/14/22 Yes Roxan Hockey, Romaldo Saville K, PA-C  Elderberry 575 MG/5ML SYRP Take 5 mLs by mouth daily.    [provider]  fluticasone (FLONASE) 50 MCG/ACT nasal spray Place 1 spray into both nostrils daily as needed for allergies or rhinitis.    [provider]  Multiple Vitamin (MULTIVITAMIN WITH MINERALS) TABS tablet Take 1 tablet by mouth daily.    [provider]  Phenylephrine-DM-GG-APAP (TYLENOL COLD/FLU SEVERE) 5-10-200-325 MG TABS Take 2 tablets by mouth every 6 (six) hours as needed (cough).    [provider]  Potassium 99 MG TABS Take 99 mg by mouth daily.    [provider]  Zinc 25 MG TABS Take 25 mg by mouth daily.    [provider]      Allergies    Patient has no known allergies.    Review of Systems   See HPI for pertinent positives   Physical Exam    Vitals:   05/09/22 1017 05/09/22 1018  BP: 131/79   Pulse: 72 78  Resp: 18   Temp: 98 F (36.7 C)   SpO2: 96% 95%    CONSTITUTIONAL:  well-appearing, NAD NEURO:  Alert and oriented x 3, CN 3-12 grossly intact EYES:  eyes equal and reactive ENT/NECK:  Supple, no stridor, noted peritonsillar edema and erythema, no exudate, uvula is midline and erythematous, no palpable cervical lymphadenopathy CARDIO:  regular rate and rhythm, appears well-perfused  PULM:  No respiratory distress, CTAB GI/GU:  non-distended MSK/SPINE:  No gross deformities, no edema, moves all extremities  SKIN:  no rash, atraumatic   *Additional and/or pertinent findings included in MDM below    ED Results / Procedures / Treatments   Labs (all labs ordered are listed, but only abnormal results are displayed) Labs Reviewed  GROUP A STREP BY PCR    EKG None  Radiology No results found.  Procedures Procedures    Medications Ordered in ED Medications  acetaminophen (TYLENOL) tablet 1,000 mg (has no administration in time range)  lidocaine (XYLOCAINE) 2 % viscous mouth solution 15 mL (has no administration in time range)  methylPREDNISolone (MEDROL) tablet 6 mg (has no administration in time range)    ED Course/ Medical Decision Making/ A&P  Medical Decision Making  32 year old well-appearing male presenting for sore throat.  Exam notable for peritonsillar erythema and edema.  DDx includes tonsillitis, strep pharyngitis, viral pharyngitis, peritonsillar abscess, upper airway obstruction.  Overall patient appears clinically well.  Was recently diagnosed with tonsillitis in February and started on steroids and antibiotics at that time.  For this encounter patient does not have a fever, no cervical lymphadenopathy thus we will not treat with antibiotics.  Started patient on Medrol.  Given the recurrence of his symptoms, I do feel it is warranted for follow-up with ENT.   Provided contact information at discharge junctions.  Patient currently also patent without stridor in the neck.  Breathing comfortably.  Treated pain with viscous lidocaine and Tylenol.  Fluid challenge without complication.  Sent Medrol to his pharmacy.  Discussed return precautions.        Final Clinical Impression(s) / ED Diagnoses Final diagnoses:  Sore throat    Rx / DC Orders ED Discharge Orders          Ordered    methylPREDNISolone (MEDROL) 4 MG tablet  Daily        05/09/22 1059              Gareth Eagle, [place] 05/09/22 1106    Terald Sleeper, MD 05/09/22 [phone]

## 2022-05-09 NOTE — Discharge Instructions (Addendum)
Evaluation for your sore throat revealed that your tonsils appear to be inflamed.  Because you do not have a fever or any enlargement of your lymph nodes I will not start you on antibiotics at this time.  However I do feel it is warranted that you follow-up with ENT in the outpatient setting.  I provided their contact information in your discharge summary.  If you start to develop trouble breathing, trouble swallowing, drooling or any other concerning symptom please return emergency department for further evaluation.

## 2022-05-09 NOTE — ED Notes (Signed)
Pt drank water but states his throat is painful when he swallows.

## 2022-05-09 NOTE — ED Triage Notes (Signed)
Pt presents with c/o sore throat that started yesterday. Pt reports pain when swallowing.

## 2022-08-03 ENCOUNTER — Encounter (HOSPITAL_COMMUNITY): Payer: Self-pay

## 2022-08-03 ENCOUNTER — Ambulatory Visit (HOSPITAL_COMMUNITY)
Admission: EM | Admit: 2022-08-03 | Discharge: 2022-08-03 | Disposition: A | Discharge status: Home / Self Care | Payer: 59 | Attending: Internal Medicine | Admitting: Internal Medicine

## 2022-08-03 DIAGNOSIS — J36 Peritonsillar abscess: Secondary | ICD-10-CM | POA: Diagnosis not present

## 2022-08-03 MED ORDER — CEFTRIAXONE SODIUM 1 G IJ SOLR
INTRAMUSCULAR | Status: AC
  Filled 2022-08-03: qty 10

## 2022-08-03 MED ORDER — LIDOCAINE HCL (PF) 1 % IJ SOLN
INTRAMUSCULAR | Status: AC
  Filled 2022-08-03: qty 2

## 2022-08-03 MED ORDER — ACETAMINOPHEN 325 MG PO TABS
975.0000 mg | ORAL_TABLET | Freq: Once | ORAL | Status: AC
  Administered 2022-08-03: 975 mg via ORAL

## 2022-08-03 MED ORDER — AMOXICILLIN-POT CLAVULANATE 875-125 MG PO TABS: 1.0000 | ORAL_TABLET | Freq: Two times a day (BID) | ORAL | 0 refills | Status: AC

## 2022-08-03 MED ORDER — CEFTRIAXONE SODIUM 1 G IJ SOLR
1.0000 g | Freq: Once | INTRAMUSCULAR | Status: AC
  Administered 2022-08-03: 1 g via INTRAMUSCULAR

## 2022-08-03 MED ORDER — ACETAMINOPHEN 325 MG PO TABS
ORAL_TABLET | ORAL | Status: AC
  Filled 2022-08-03: qty 3

## 2022-08-03 MED ORDER — METHYLPREDNISOLONE 4 MG PO TBPK: ORAL_TABLET | ORAL | 0 refills | Status: AC

## 2022-08-03 NOTE — Discharge Instructions (Signed)
You should improve in 24 hours, but if you get worse where you can't swallow your saliva, go to the ER

## 2022-08-03 NOTE — ED Provider Notes (Addendum)
[facility]    CSN: [id number] Arrival date & time: 08/03/22  1933      History   Chief Complaint Chief Complaint  Patient presents with   Sore Throat    HPI Richard [name] is a 32 y.o. male who presents with R ear and tonsillar pain x 2 days. Has hx of tonsillitis. Has not taken anything for pain. Denies having a fever, or URI symptoms. Has seen ENT since he gets this or tonsillitis every 3 months.     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 No pertinent surgical history.     Home Medications    Prior to Admission medications   Medication Sig Start Date End Date Taking? Authorizing Provider  amoxicillin-clavulanate (AUGMENTIN) 875-125 MG tablet Take 1 tablet by mouth every 12 (twelve) hours. 08/04/22  Yes Rodriguez-Southworth, Nettie Elm, PA-C  Elderberry 575 MG/5ML SYRP Take 5 mLs by mouth daily.   Yes [provider]  fluticasone (FLONASE) 50 MCG/ACT nasal spray Place 1 spray into both nostrils daily as needed for allergies or rhinitis.   Yes [provider]  methylPREDNISolone (MEDROL DOSEPAK) 4 MG TBPK tablet Take as directed 08/03/22  Yes Rodriguez-Southworth, Nettie Elm, PA-C  Multiple Vitamin (MULTIVITAMIN WITH MINERALS) TABS tablet Take 1 tablet by mouth daily.   Yes [provider]  Potassium 99 MG TABS Take 99 mg by mouth daily.   Yes [provider]  Zinc 25 MG TABS Take 25 mg by mouth daily.   Yes [provider]    Family History Family History  Problem Relation Age of Onset   Cancer Other    Diabetes Other    Hypertension Other    Diabetes Mother     Social History Social History   Tobacco Use   Smoking status: Former    Packs/day: .5    Types: Cigarettes    Quit date: 01/2017    Years since quitting: 5.5   Smokeless tobacco: Never  Vaping Use   Vaping Use: Former  Substance Use Topics   Alcohol use: No   Drug use: Yes    Types: Marijuana     Allergies   Patient has no known allergies.   Review of Systems Review of Systems  Constitutional:  Negative for fever.  HENT:  Positive for ear pain and sore throat. Negative for congestion, ear discharge, postnasal drip, rhinorrhea and trouble swallowing.   Respiratory:  Negative for cough.      Physical Exam Triage Vital Signs ED Triage Vitals [08/03/22 1947]  Enc Vitals Group     BP 127/85     Pulse Rate 90     Resp 16     Temp 98.6 F (37 C)     Temp Source Oral     SpO2 95 %     Weight 253 lb (114.8 kg)     Height 6' (1.829 m)  Head Circumference      Peak Flow      Pain Score 8     Pain Loc      Pain Edu?      Excl. in GC?    No data found.  Updated Vital Signs BP 127/85 (BP Location: Right Arm)   Pulse 90   Temp 98.6 F (37 C) (Oral)   Resp 16   Ht 6' (1.829 m)   Wt 253 lb (114.8 kg)   SpO2 95%   BMI 34.31 kg/m   Visual Acuity Right Eye Distance:   Left Eye Distance:   Bilateral Distance:    Right Eye Near:   Left Eye Near:    Bilateral Near:     Physical Exam Vitals and nursing note reviewed.  Constitutional:      General: He is not in acute distress.    Appearance: He is obese. He is not toxic-appearing.  HENT:     Right Ear: Tympanic membrane and ear canal normal.     Left Ear: Tympanic membrane and ear canal normal.     Mouth/Throat:     Pharynx: Uvula midline. Posterior oropharyngeal erythema  present.     Tonsils: Tonsillar abscess present. No tonsillar exudate. 2+ on the right. 2+ on the left.     Comments: He does not sound like he has a hot potato in his mouth, is swallowing his saliva without any gestures of pain Pulmonary:     Effort: Pulmonary effort is normal.  Musculoskeletal:     Cervical back: Neck supple.  Lymphadenopathy:     Cervical: Cervical adenopathy present.  Skin:    General: Skin is warm and dry.  Neurological:     Mental Status: He is alert.  Psychiatric:        Mood and Affect: Mood normal.        Behavior: Behavior normal.      UC Treatments / Results  Labs (all labs ordered are listed, but only abnormal results are displayed) Labs Reviewed - No data to display   EKG   Radiology No results found.  Procedures Procedures (including critical care time)  Medications Ordered in UC Medications  cefTRIAXone (ROCEPHIN) injection 1 g (1 g Intramuscular Given 08/03/22 2019)  acetaminophen (TYLENOL) tablet 975 mg (975 mg Oral Given 08/03/22 2019)    Initial Impression / Assessment and Plan / UC Course  I have reviewed the triage vital signs and the nursing notes.  R peritonsillar abscess  He was given Rocephin 1G IM, Tylenol for pain as noted. I sent Medrol and Augmentin as noted.  Pt left before strep test was obtained.  Final Clinical Impressions(s) / UC Diagnoses   Final diagnoses:  Peritonsillar abscess     Discharge Instructions      You should improve in 24 hours, but if you get worse where you can't swallow your saliva, go to the ER      ED Prescriptions     Medication Sig Dispense Auth. Provider   methylPREDNISolone (MEDROL DOSEPAK) 4 MG TBPK tablet Take as directed 21 tablet Rodriguez-Southworth, Kenson Groh, PA-C   amoxicillin-clavulanate (AUGMENTIN) 875-125 MG tablet Take 1 tablet by mouth every 12 (twelve) hours. 20 tablet Rodriguez-Southworth, Nettie Elm, PA-C      PDMP not reviewed this encounter.    Rodriguez-Southworth, Nettie Elm, PA-C 08/03/22 2030    Rodriguez-Southworth, [place], PA-C 08/03/22 2031

## 2022-08-03 NOTE — ED Triage Notes (Signed)
Patient here today with c/o right ear pain and tonsill pain X 2 days. He has a h/o tonsillitis. Has not taken anything to help treat the pain. No sick contacts. No recent travel.

## 2023-09-05 ENCOUNTER — Emergency Department (HOSPITAL_COMMUNITY)
Admission: EM | Admit: 2023-09-05 | Discharge: 2023-09-05 | Disposition: A | Discharge status: Home / Self Care | Attending: Emergency Medicine | Admitting: Emergency Medicine

## 2023-09-05 ENCOUNTER — Other Ambulatory Visit: Payer: Self-pay

## 2023-09-05 ENCOUNTER — Encounter (HOSPITAL_COMMUNITY): Payer: Self-pay

## 2023-09-05 DIAGNOSIS — R531 Weakness: Secondary | ICD-10-CM | POA: Insufficient documentation

## 2023-09-05 DIAGNOSIS — J069 Acute upper respiratory infection, unspecified: Secondary | ICD-10-CM | POA: Insufficient documentation

## 2023-09-05 DIAGNOSIS — R059 Cough, unspecified: Secondary | ICD-10-CM | POA: Diagnosis present

## 2023-09-05 DIAGNOSIS — R1013 Epigastric pain: Secondary | ICD-10-CM | POA: Insufficient documentation

## 2023-09-05 LAB — COMPREHENSIVE METABOLIC PANEL WITH GFR
ALT: 24 U/L (ref 0–44)
AST: 27 U/L (ref 15–41)
Albumin: 4.1 g/dL (ref 3.5–5.0)
Alkaline Phosphatase: 72 U/L (ref 38–126)
Anion gap: 11 (ref 5–15)
BUN: 7 mg/dL (ref 6–20)
CO2: 22 mmol/L (ref 22–32)
Calcium: 9.3 mg/dL (ref 8.9–10.3)
Chloride: 102 mmol/L (ref 98–111)
Creatinine, Ser: 1.45 mg/dL — ABNORMAL HIGH (ref 0.61–1.24)
GFR, Estimated: >60 mL/min (ref >60)
Glucose, Bld: 96 mg/dL (ref 70–99)
Potassium: 3.8 mmol/L (ref 3.5–5.1)
Sodium: 135 mmol/L (ref 135–145)
Total Bilirubin: 1.1 mg/dL (ref 0.0–1.2)
Total Protein: 7.2 g/dL (ref 6.5–8.1)

## 2023-09-05 LAB — RESP PANEL BY RT-PCR (RSV, FLU A&B, COVID)  RVPGX2
Influenza A by PCR: NEGATIVE
Influenza B by PCR: NEGATIVE
Resp Syncytial Virus by PCR: NEGATIVE
SARS Coronavirus 2 by RT PCR: NEGATIVE

## 2023-09-05 LAB — URINALYSIS, ROUTINE W REFLEX MICROSCOPIC
Bilirubin Urine: NEGATIVE
Glucose, UA: NEGATIVE mg/dL
Hgb urine dipstick: NEGATIVE
Ketones, ur: NEGATIVE mg/dL
Leukocytes,Ua: NEGATIVE
Nitrite: NEGATIVE
Protein, ur: NEGATIVE mg/dL
Specific Gravity, Urine: 1.009 (ref 1.005–1.030)
pH: 6 (ref 5.0–8.0)

## 2023-09-05 LAB — CBC
HCT: 41.9 % (ref 39.0–52.0)
Hemoglobin: 14.5 g/dL (ref 13.0–17.0)
MCH: 31.9 pg (ref 26.0–34.0)
MCHC: 34.6 g/dL (ref 30.0–36.0)
MCV: 92.1 fL (ref 80.0–100.0)
Platelets: 261 K/uL (ref 150–400)
RBC: 4.55 MIL/uL (ref 4.22–5.81)
RDW: 12.2 % (ref 11.5–15.5)
WBC: 11.5 K/uL — ABNORMAL HIGH (ref 4.0–10.5)
nRBC: 0 % (ref 0.0–0.2)

## 2023-09-05 LAB — CBG MONITORING, ED: Glucose-Capillary: 99 mg/dL (ref 70–99)

## 2023-09-05 MED ORDER — KETOROLAC TROMETHAMINE 15 MG/ML IJ SOLN
15.0000 mg | Freq: Once | INTRAMUSCULAR | Status: AC
  Administered 2023-09-05: 15 mg via INTRAVENOUS
  Filled 2023-09-05: qty 1

## 2023-09-05 MED ORDER — LACTATED RINGERS IV BOLUS
1000.0000 mL | Freq: Once | INTRAVENOUS | Status: AC
  Administered 2023-09-05: 1000 mL via INTRAVENOUS

## 2023-09-05 MED ORDER — DEXAMETHASONE SODIUM PHOSPHATE 10 MG/ML IJ SOLN
10.0000 mg | Freq: Once | INTRAMUSCULAR | Status: AC
  Administered 2023-09-05: 10 mg via INTRAVENOUS
  Filled 2023-09-05: qty 1

## 2023-09-05 MED ORDER — METOCLOPRAMIDE HCL 5 MG/ML IJ SOLN
10.0000 mg | Freq: Once | INTRAMUSCULAR | Status: AC
  Administered 2023-09-05: 10 mg via INTRAVENOUS
  Filled 2023-09-05: qty 2

## 2023-09-05 MED ORDER — ONDANSETRON HCL 4 MG PO TABS: 4.0000 mg | ORAL_TABLET | Freq: Four times a day (QID) | ORAL | 0 refills | Status: AC

## 2023-09-05 NOTE — Discharge Instructions (Addendum)
 You were seen today for headache, cough, congestion, abdominal pain.  This is likely secondary to a upper respiratory infection, likely to be viral.  Will recommend that if you continue to have persistent symptoms lasting longer than 1 week, to follow-up with primary care or urgent care for reevaluation as you may need antibiotics that time.  However you begin to have any new or worsening symptoms which include uncontrollable pain, shortness of breath, chest pain, blood in cough, blood in stool or vomit please return to the ED for further evaluation.  Otherwise take the nausea medication as needed every 6 hours.  As well as taking Tylenol  for body aches and chills.  Take Tylenol  (acetominophen)  650mg  every 4-6 hours, as needed for pain or fever. Do not take more than 4,000 mg in a 24-hour period. As this may cause liver damage. While this is rare, if you begin to develop yellowing of the skin or eyes, stop taking and return to ER immediately.

## 2023-09-05 NOTE — ED Provider Notes (Signed)
 [place] EMERGENCY DEPARTMENT AT [facility] Provider Note   CSN: [id number] Arrival date & time: 09/05/23  1649     Patient presents with: Weakness and Headache   Richard [name] is a 33 y.o. male.  Weakness Associated symptoms: cough and headaches   Headache Associated symptoms: congestion, cough and weakness    Patient is a 33 year old male presented ED today for concerns for headache, frontal pulsatile and slow onset starting last night that had been accompanied with nausea, cough, congestion as well as body aches and chills and epigastric abdominal pain.  Denies any sick contacts.  Denies suspicious foods.  Previous history of TTP.  Treated last month for Tonsillitis with Augmentin  and prednisone .  Denies fever, vision changes, dysphagia, chest pain, shortness of breath, vomiting, diarrhea, melena, hematochezia, dysuria, lower leg swelling.  Rashes.    Prior to Admission medications   Medication Sig Start Date End Date Taking? Authorizing Provider  ondansetron  (ZOFRAN ) 4 MG tablet Take 1 tablet (4 mg total) by mouth every 6 (six) hours. 09/05/23  Yes Teagen Bucio S, PA-C  amoxicillin -clavulanate (AUGMENTIN ) 875-125 MG tablet Take 1 tablet by mouth every 12 (twelve) hours. 08/04/22   Rodriguez-Southworth, Sylvia, PA-C  Elderberry 575 MG/5ML SYRP Take 5 mLs by mouth daily.    [provider]  fluticasone (FLONASE) 50 MCG/ACT nasal spray Place 1 spray into both nostrils daily as needed for allergies or rhinitis.    [provider]  methylPREDNISolone  (MEDROL  DOSEPAK) 4 MG TBPK tablet Take as directed 08/03/22   Rodriguez-Southworth, Sylvia, PA-C  Multiple Vitamin (MULTIVITAMIN WITH MINERALS) TABS tablet Take 1 tablet by mouth daily.    [provider]  Potassium 99 MG TABS Take 99 mg by mouth daily.    [provider]  Zinc 25 MG TABS Take 25 mg by mouth daily.    [provider]    Allergies: Patient has no known  allergies.    Review of Systems  Constitutional:  Positive for chills.  HENT:  Positive for congestion.   Respiratory:  Positive for cough.   Neurological:  Positive for weakness and headaches.  All other systems reviewed and are negative.   Updated Vital Signs BP (!) 151/76 (BP Location: Right Arm)   Pulse (!) 114   Temp 99.6 F (37.6 C)   Resp 20   Ht 6' (1.829 m)   Wt 120.2 kg   SpO2 97%   BMI 35.94 kg/m   Physical Exam Vitals and nursing note reviewed.  Constitutional:      General: He is not in acute distress.    Appearance: Normal appearance. He is ill-appearing. He is not toxic-appearing or diaphoretic.  HENT:     Head: Normocephalic and atraumatic.     Mouth/Throat:     Mouth: Mucous membranes are moist.     Pharynx: Oropharynx is clear. No oropharyngeal exudate or posterior oropharyngeal erythema.  Eyes:     General: No scleral icterus.       Right eye: No discharge.        Left eye: No discharge.     Extraocular Movements: Extraocular movements intact.     Right eye: Normal extraocular motion and no nystagmus.     Left eye: Normal extraocular motion and no nystagmus.     Conjunctiva/sclera: Conjunctivae normal.     Pupils: Pupils are equal, round, and reactive to light.     Right eye: Pupil is round and reactive.     Left eye:  Pupil is round and reactive.  Neck:     Meningeal: Brudzinski's sign absent.  Cardiovascular:     Rate and Rhythm: Normal rate and regular rhythm.     Pulses: Normal pulses.     Heart sounds: Normal heart sounds. No murmur heard.    No friction rub. No gallop.  Pulmonary:     Effort: Pulmonary effort is normal. No respiratory distress.     Breath sounds: No stridor. No wheezing, rhonchi or rales.  Chest:     Chest wall: No tenderness.  Abdominal:     General: Abdomen is flat. There is no distension.     Palpations: Abdomen is soft.     Tenderness: There is no abdominal tenderness. There is no right CVA tenderness, left CVA  tenderness, guarding or rebound.  Musculoskeletal:        General: No swelling, deformity or signs of injury.     Cervical back: Normal range of motion. No rigidity.     Right lower leg: No edema.     Left lower leg: No edema.  Lymphadenopathy:     Cervical: No cervical adenopathy.  Skin:    General: Skin is warm and dry.     Findings: No bruising, erythema or lesion.  Neurological:     General: No focal deficit present.     Mental Status: He is alert and oriented to person, place, and time. Mental status is at baseline.     Cranial Nerves: No cranial nerve deficit.     Sensory: No sensory deficit.     Motor: No weakness.     Comments: No facial asymmetry, no ataxia, no apraxia, no aphasia, normal sensation to both upper and lower extremities bilaterally,normal strength to both flexion and extension to both upper lower extremities 5+ bilaterally, no visual field deficits, no nystagmus.   Psychiatric:        Mood and Affect: Mood normal.     (all labs ordered are listed, but only abnormal results are displayed) Labs Reviewed  COMPREHENSIVE METABOLIC PANEL WITH GFR - Abnormal; Notable for the following components:      Result Value   Creatinine, Ser 1.45 (*)    All other components within normal limits  CBC - Abnormal; Notable for the following components:   WBC 11.5 (*)    All other components within normal limits  RESP PANEL BY RT-PCR (RSV, FLU A&B, COVID)  RVPGX2  URINALYSIS, ROUTINE W REFLEX MICROSCOPIC  CBG MONITORING, ED    EKG: None  Radiology: No results found.  Procedures   Medications Ordered in the ED  lactated ringers  bolus 1,000 mL (1,000 mLs Intravenous New Bag/Given 09/05/23 1909)  metoCLOPramide  (REGLAN ) injection 10 mg (10 mg Intravenous Given 09/05/23 1909)  ketorolac  (TORADOL ) 15 MG/ML injection 15 mg (15 mg Intravenous Given 09/05/23 1907)  dexamethasone  (DECADRON ) injection 10 mg (10 mg Intravenous Given 09/05/23 1908)                                 Medical Decision Making Amount and/or Complexity of Data Reviewed Labs: ordered.  Risk Prescription drug management.   This patient is a 33 year old male who presents to the ED for concern of 1 day history of acute migraine-like headache, with nausea, no vomiting as well as coming with cough and congestion and some epigastric discomfort.   On physical exam, patient is in no acute distress, afebrile, alert and orient x 4,  speaking in full sentences, nontachypneic, nontachycardic.  LCTAB, RRR, no murmur, no lower leg edema.  Mild epigastric tenderness to palpation, negative Murphy sign and negative McBurney's point.  Oropharynx is clear with no signs of erythema or exudate.  Congestion is noted, with no tenderness over the sinuses.  Unremarkable exam otherwise.  Low suspicion for pneumonia with clear lungs and no chest pain and no shortness of breath.  Suspecting URI, however with patient's previous history as well as epigastric tenderness, labs were done which showed a mildly increased creatinine 1.45 with patient noting to have chronically elevated creatinine however is slightly slightly increased to dehydration.  Provided fluids as well as migraine cocktail.  Considered imaging however GI bleed is more at this time with patient not having concerning signs of pneumonia as well as only experiencing some mild epigastric discomfort.  On reevaluation, patient notes that his symptoms are better.  Will send him home with Zofran  and have him follow-up with PCP for any persistent symptoms and return to the ED for any new or worsening symptoms.    Patient vital signs have remained stable throughout the course of patient's time in the ED. Low suspicion for any other emergent pathology at this time. I believe this patient is safe to be discharged. Provided strict return to ER precautions. Patient expressed agreement and understanding of plan. All questions were answered.  Differential diagnoses prior  to evaluation: The emergent differential diagnosis includes, but is not limited to, URI, pneumonia, bronchitis, meningitis, pancreatitis, gastritis, cholecystitis, AAA, tonsillitis, sinusitis. This is not an exhaustive differential.   Past Medical History / Co-morbidities / Social History: Sickle cell trait, TTP  Additional history: Chart reviewed. Pertinent results include:   Seen recently for tonsillitis and be given Augmentin  and prednisone   Lab Tests/Imaging studies: I personally interpreted labs/imaging and the pertinent results include:    CBC notes a mildly elevated white count 11.5 but otherwise unremarkable Urinalysis unremarkable CMP notes elevated creatinine 1.45, to be chronically elevated but slightly increased secondary to dehydration likely UA unremarkable Respiratory panel negative  Cardiac monitoring: EKG obtained and interpreted by myself and attending physician which shows: Sinus tachycardia   Medications: I ordered medication including LR, Reglan , Decadron , Toradol .  I have reviewed the patients home medicines and have made adjustments as needed.  Critical Interventions: None  Social Determinants of Health: Does not have a PCP  Disposition: After consideration of the diagnostic results and the patients response to treatment, I feel that the patient would benefit from discharge and show as above.   emergency department workup does not suggest an emergent condition requiring admission or immediate intervention beyond what has been performed at this time. The plan is: Establish care with a PCP, Zofran  for nausea, follow-up with urgent care or PCP for persistent symptoms, return to the ED for new or worsening symptoms. The patient is safe for discharge and has been instructed to return immediately for worsening symptoms, change in symptoms or any other concerns.   Final diagnoses:  Viral upper respiratory tract infection    ED Discharge Orders           Ordered    ondansetron  (ZOFRAN ) 4 MG tablet  Every 6 hours        09/05/23 1937               Beola Terrall GORMAN DEVONNA 09/05/23 1940    Patt Alm Macho, MD 09/05/23 2159

## 2023-09-05 NOTE — ED Triage Notes (Signed)
 Pt c.o generalized weakness, chills and headache since 7pm last night. Pt has hx of aTTP, last falre up was in 2021 and states this feels similar

## 2023-12-04 ENCOUNTER — Other Ambulatory Visit: Payer: Self-pay

## 2023-12-04 ENCOUNTER — Emergency Department (HOSPITAL_COMMUNITY)
Admission: EM | Admit: 2023-12-04 | Discharge: 2023-12-04 | Disposition: A | Discharge status: Home / Self Care | Attending: Emergency Medicine | Admitting: Emergency Medicine

## 2023-12-04 ENCOUNTER — Emergency Department (HOSPITAL_COMMUNITY)

## 2023-12-04 ENCOUNTER — Encounter (HOSPITAL_COMMUNITY): Payer: Self-pay | Admitting: *Deleted

## 2023-12-04 DIAGNOSIS — D72829 Elevated white blood cell count, unspecified: Secondary | ICD-10-CM | POA: Insufficient documentation

## 2023-12-04 DIAGNOSIS — R509 Fever, unspecified: Secondary | ICD-10-CM | POA: Insufficient documentation

## 2023-12-04 DIAGNOSIS — J069 Acute upper respiratory infection, unspecified: Secondary | ICD-10-CM

## 2023-12-04 DIAGNOSIS — R0981 Nasal congestion: Secondary | ICD-10-CM | POA: Diagnosis not present

## 2023-12-04 LAB — CBC WITH DIFFERENTIAL/PLATELET
Abs Immature Granulocytes: 0.03 K/uL (ref 0.00–0.07)
Basophils Absolute: 0.1 K/uL (ref 0.0–0.1)
Basophils Relative: 1 %
Eosinophils Absolute: 0 K/uL (ref 0.0–0.5)
Eosinophils Relative: 0 %
HCT: 44.8 % (ref 39.0–52.0)
Hemoglobin: 15.8 g/dL (ref 13.0–17.0)
Immature Granulocytes: 0 %
Lymphocytes Relative: 22 %
Lymphs Abs: 2.4 K/uL (ref 0.7–4.0)
MCH: 31.7 pg (ref 26.0–34.0)
MCHC: 35.3 g/dL (ref 30.0–36.0)
MCV: 90 fL (ref 80.0–100.0)
Monocytes Absolute: 1.4 K/uL — ABNORMAL HIGH (ref 0.1–1.0)
Monocytes Relative: 13 %
Neutro Abs: 7 K/uL (ref 1.7–7.7)
Neutrophils Relative %: 64 %
Platelets: 260 K/uL (ref 150–400)
RBC: 4.98 MIL/uL (ref 4.22–5.81)
RDW: 12.5 % (ref 11.5–15.5)
WBC: 10.9 K/uL — ABNORMAL HIGH (ref 4.0–10.5)
nRBC: 0 % (ref 0.0–0.2)

## 2023-12-04 LAB — BASIC METABOLIC PANEL WITH GFR
Anion gap: 11 (ref 5–15)
BUN: 8 mg/dL (ref 6–20)
CO2: 22 mmol/L (ref 22–32)
Calcium: 9.3 mg/dL (ref 8.9–10.3)
Chloride: 103 mmol/L (ref 98–111)
Creatinine, Ser: 1.25 mg/dL — ABNORMAL HIGH (ref 0.61–1.24)
GFR, Estimated: >60 mL/min (ref >60)
Glucose, Bld: 101 mg/dL — ABNORMAL HIGH (ref 70–99)
Potassium: 3.8 mmol/L (ref 3.5–5.1)
Sodium: 136 mmol/L (ref 135–145)

## 2023-12-04 MED ORDER — LACTATED RINGERS IV BOLUS
1000.0000 mL | Freq: Once | INTRAVENOUS | Status: AC
  Administered 2023-12-04: 1000 mL via INTRAVENOUS

## 2023-12-04 MED ORDER — PREDNISONE 10 MG PO TABS: 10.0000 mg | ORAL_TABLET | Freq: Every day | ORAL | 0 refills | Status: AC

## 2023-12-04 NOTE — ED Notes (Signed)
 X-ray at bedside.

## 2023-12-04 NOTE — ED Notes (Signed)
 Urine sample sent to main lab...KM

## 2023-12-04 NOTE — Discharge Instructions (Addendum)
 Richard [name]:  Thank you for allowing us  to take care of you today.  We hope you begin feeling better soon. You were seen today for congestion, chills, fatigue.  While you were here we performed physical examination, lab work as well as x-ray imaging.  There was no emergent or bacterial cause of your symptoms found today.  Your symptoms are consistent with a viral upper respiratory tract infection.  We do not treat these infections with antibiotics because they are due to viruses which can treatments.  we support these symptomatically which means that we encourage fluid intake with water , gatorade, pedialyte as well as eating full balanced meals.  Continue using your Flonase as needed, and alternate Tylenol /ibuprofen  as needed for pain or fever.  Symptoms over 5 to 6 days, please go to PCP or return for further evaluation  To-Do:  Please follow-up with your primary doctor within the next 2-3 days. It is important that you review any labs or imaging results (if any) that you had today with them. Your preliminary imaging results (if any) are attached. Please return to the Emergency Department or call 911 if you experience chest pain, shortness of breath, severe pain, severe fever, altered mental status, or have any reason to think that you need emergency medical care.  Thank you again.  Hope you feel better soon.  Department of Emergency Medicine

## 2023-12-04 NOTE — ED Provider Notes (Signed)
 [place] EMERGENCY DEPARTMENT AT [facility] Provider Note  MDM   HPI/ROS:  Mac Dowdell is a 33 y.o. male with a PMH of sickle cell trait and TTP who presents to the emergency department for body aches and subjective fever.  He reports that he has had some increasing nasal drainage, body aches and chills over the last 2 days.  He denies any sore throat, cough, abdominal pain, N/V/D.  Physical exam is notable for: - Nontoxic but ill-appearing male, in no acute distress, with no increased work of breathing.  Lungs are CTAB, he does have bilateral nasal congestion, no TTP to sinuses or mastoids.    On my initial evaluation, patient is:  -Vital signs significant for hypertension, tachycardia at a rate of 112, saturating 100% on RA, afebrile..  Patient is hemodynamically stable and nontoxic-appearing.  TMs bilaterally are clear without evidence of AOM/AOE, no evidence of posterior oropharynx erythema or edema, no exudates, uvula is midline, no evidence of PTA/RPA.  No Ludwig's angina, lungs are CTAB, no lower extremity pitting edema.  Given short duration of symptoms, lower suspicion that this would be secondary to pneumonia, patient states that he was seen here in August for similar and was diagnosed with a viral URI and feels like it is similar.  That time he did have an elevated creatinine therefore will obtain basic lab work as well as x-ray imaging.  BMP with creatinine of 1.25 (this is improved from patient's baseline), CBC with slight leukocytosis with WBC of 10.9, CXR without any acute cardiopulmonary abnormalities.  Patient received 1 L fluid with resolution of his tachycardia. Patient's history, physical exam and workup is consistent with a viral URI.  Strict return precautions were given. Interpretations, interventions, and the patient's course of care are documented below.     Disposition:  I discussed the plan for discharge with the patient and/or their surrogate at  bedside prior to discharge and they were in agreement with the plan and verbalized understanding of the return precautions provided. All questions answered to the best of my ability. Ultimately, the patient was discharged in stable condition with stable vital signs. I am reassured that they are capable of close follow up and good social support at home.   Clinical Impression: No diagnosis found.  Rx / DC Orders ED Discharge Orders     None        Clinical Complexity A medically appropriate history, review of systems, and physical exam was performed.  My independent interpretations of EKG, labs, and radiology are documented in the ED course above.   If decision rules were used in this patient's evaluation, they are listed below.   Click here for ABCD2, HEART and other calculatorsREFRESH Note before signing   Patient's presentation is most consistent with acute, uncomplicated illness.  Medical Decision Making Amount and/or Complexity of Data Reviewed Labs: ordered. Radiology: ordered.    HPI/ROS      See MDM section for pertinent HPI and ROS. A complete ROS was performed with pertinent positives/negatives noted above.   Past Medical History:  Diagnosis Date   Sickle cell trait    Thrombotic thrombocytopenic purpura (TTP) (HCC)     History reviewed. No pertinent surgical history.    Physical Exam   Vitals:   12/04/23 1939 12/04/23 1941  BP: (!) 160/96   Pulse: (!) 112   Resp: 18   Temp: 98.8 F (37.1 C)   TempSrc: Oral   SpO2: 100%   Weight:  120.2  kg  Height:  6' (1.829 m)    Physical Exam Vitals and nursing note reviewed.  Constitutional:      General: He is not in acute distress.    Appearance: He is well-developed.  HENT:     Head: Normocephalic and atraumatic.  Eyes:     Conjunctiva/sclera: Conjunctivae normal.  Cardiovascular:     Rate and Rhythm: Normal rate and regular rhythm.     Heart sounds: No murmur heard. Pulmonary:     Effort:  Pulmonary effort is normal. No respiratory distress.     Breath sounds: Normal breath sounds.  Abdominal:     Palpations: Abdomen is soft.     Tenderness: There is no abdominal tenderness.  Musculoskeletal:        General: No swelling.     Cervical back: Neck supple.  Skin:    General: Skin is warm and dry.     Capillary Refill: Capillary refill takes less than 2 seconds.  Neurological:     Mental Status: He is alert.  Psychiatric:        Mood and Affect: Mood normal.      Procedures   If procedures were preformed on this patient, they are listed below:  Procedures   Please note that this documentation was produced with the assistance of voice-to-text technology and may contain errors.     Billy Pal, MD 12/04/23 7776    Charlyn Sora, MD 12/05/23 8394

## 2023-12-04 NOTE — ED Triage Notes (Signed)
Pt c/o body aches and chills

## 2023-12-04 NOTE — ED Notes (Signed)
 ED Provider at bedside.

## 2023-12-15 ENCOUNTER — Emergency Department (HOSPITAL_COMMUNITY)
Admission: EM | Admit: 2023-12-15 | Discharge: 2023-12-15 | Disposition: A | Discharge status: Other Acute Inpatient Hospital | Attending: Emergency Medicine | Admitting: Emergency Medicine

## 2023-12-15 ENCOUNTER — Emergency Department (HOSPITAL_COMMUNITY)

## 2023-12-15 ENCOUNTER — Other Ambulatory Visit: Payer: Self-pay

## 2023-12-15 DIAGNOSIS — I959 Hypotension, unspecified: Secondary | ICD-10-CM | POA: Diagnosis not present

## 2023-12-15 DIAGNOSIS — R1010 Upper abdominal pain, unspecified: Secondary | ICD-10-CM | POA: Insufficient documentation

## 2023-12-15 DIAGNOSIS — R110 Nausea: Secondary | ICD-10-CM | POA: Insufficient documentation

## 2023-12-15 DIAGNOSIS — K921 Melena: Secondary | ICD-10-CM | POA: Insufficient documentation

## 2023-12-15 DIAGNOSIS — D649 Anemia, unspecified: Secondary | ICD-10-CM | POA: Diagnosis not present

## 2023-12-15 DIAGNOSIS — R1084 Generalized abdominal pain: Secondary | ICD-10-CM | POA: Diagnosis not present

## 2023-12-15 DIAGNOSIS — Z862 Personal history of diseases of the blood and blood-forming organs and certain disorders involving the immune mechanism: Secondary | ICD-10-CM | POA: Insufficient documentation

## 2023-12-15 DIAGNOSIS — Z743 Need for continuous supervision: Secondary | ICD-10-CM | POA: Diagnosis not present

## 2023-12-15 LAB — CBC WITH DIFFERENTIAL/PLATELET
Basophils Absolute: 0 K/uL (ref 0.0–0.1)
Basophils Relative: 0 %
Eosinophils Absolute: 0.2 K/uL (ref 0.0–0.5)
Eosinophils Relative: 1 %
HCT: 25.9 % — ABNORMAL LOW (ref 39.0–52.0)
Hemoglobin: 9.2 g/dL — ABNORMAL LOW (ref 13.0–17.0)
Lymphocytes Relative: 30 %
Lymphs Abs: 4.6 K/uL — ABNORMAL HIGH (ref 0.7–4.0)
MCH: 31.8 pg (ref 26.0–34.0)
MCHC: 35.5 g/dL (ref 30.0–36.0)
MCV: 89.6 fL (ref 80.0–100.0)
Monocytes Absolute: 0.8 K/uL (ref 0.1–1.0)
Monocytes Relative: 5 %
Neutro Abs: 9.9 K/uL — ABNORMAL HIGH (ref 1.7–7.7)
Neutrophils Relative %: 64 %
Platelets: 8 K/uL — CL (ref 150–400)
RBC: 2.89 MIL/uL — ABNORMAL LOW (ref 4.22–5.81)
RDW: 15.4 % (ref 11.5–15.5)
WBC: 15.4 K/uL — ABNORMAL HIGH (ref 4.0–10.5)
nRBC: 0.2 % (ref 0.0–0.2)

## 2023-12-15 LAB — PROTIME-INR
INR: 1.1 (ref 0.8–1.2)
Prothrombin Time: 14.4 s (ref 11.4–15.2)

## 2023-12-15 LAB — COMPREHENSIVE METABOLIC PANEL WITH GFR
ALT: 29 U/L (ref 0–44)
AST: 41 U/L (ref 15–41)
Albumin: 3.8 g/dL (ref 3.5–5.0)
Alkaline Phosphatase: 73 U/L (ref 38–126)
Anion gap: 15 (ref 5–15)
BUN: 17 mg/dL (ref 6–20)
CO2: 18 mmol/L — ABNORMAL LOW (ref 22–32)
Calcium: 8.9 mg/dL (ref 8.9–10.3)
Chloride: 102 mmol/L (ref 98–111)
Creatinine, Ser: 1.26 mg/dL — ABNORMAL HIGH (ref 0.61–1.24)
GFR, Estimated: >60 mL/min (ref >60)
Glucose, Bld: 124 mg/dL — ABNORMAL HIGH (ref 70–99)
Potassium: 3.9 mmol/L (ref 3.5–5.1)
Sodium: 135 mmol/L (ref 135–145)
Total Bilirubin: 3.7 mg/dL — ABNORMAL HIGH (ref 0.0–1.2)
Total Protein: 6.9 g/dL (ref 6.5–8.1)

## 2023-12-15 LAB — PATHOLOGIST SMEAR REVIEW

## 2023-12-15 LAB — TYPE AND SCREEN
ABO/RH(D): O POS
Antibody Screen: NEGATIVE

## 2023-12-15 LAB — POC OCCULT BLOOD, ED: Fecal Occult Blood, POC: POSITIVE — AB

## 2023-12-15 MED ORDER — IOHEXOL 350 MG/ML SOLN
85.0000 mL | Freq: Once | INTRAVENOUS | Status: AC | PRN
  Administered 2023-12-15: 85 mL via INTRAVENOUS

## 2023-12-15 MED ORDER — METHYLPREDNISOLONE SODIUM SUCC 125 MG IJ SOLR
125.0000 mg | Freq: Once | INTRAMUSCULAR | Status: AC
  Administered 2023-12-15: 125 mg via INTRAVENOUS
  Filled 2023-12-15: qty 2

## 2023-12-15 MED ORDER — PANTOPRAZOLE SODIUM 40 MG IV SOLR
80.0000 mg | Freq: Once | INTRAVENOUS | Status: AC
  Administered 2023-12-15: 80 mg via INTRAVENOUS
  Filled 2023-12-15: qty 20

## 2023-12-15 NOTE — ED Notes (Signed)
 Patient POV for abdominal pain since Sunday. Patient endorses hemorrhoids that turned into an abscess that was drained Monday or Tuesday. Stool is black in nature. Nausea vomiting without hematemesis. Endorses fatigue and chest tightness. Recently tx for URI.

## 2023-12-15 NOTE — ED Provider Notes (Signed)
 [facility] Provider Note   CSN: [id number] Arrival date & time: 12/15/23  1237     History  Chief Complaint  Patient presents with   Abdominal Pain    Richard [name] is a 33 y.o. male with PMH as listed below who presents with upper abdominal pain, nausea, and loose mushy black stools. Started Sunday. Also includes dark urine and headache and chest pressure. Had a hemorrhoid procedure last week.  Patient has been taking 800 mg ibuprofen  3 times daily.  Patient has history of TTP.  No h/o GIB. No h/o liver disease. Drinks alcohol socially.    Past Medical History:  Diagnosis Date   Sickle cell trait    Thrombotic thrombocytopenic purpura (TTP) (HCC)        Home Medications Prior to Admission medications   Medication Sig Start Date End Date Taking? Authorizing Provider  amoxicillin -clavulanate (AUGMENTIN ) 875-125 MG tablet Take 1 tablet by mouth every 12 (twelve) hours. 08/04/22   Rodriguez-Southworth, Sylvia, PA-C  Elderberry 575 MG/5ML SYRP Take 5 mLs by mouth daily.    [provider]  fluticasone (FLONASE) 50 MCG/ACT nasal spray Place 1 spray into both nostrils daily as needed for allergies or rhinitis.    [provider]  methylPREDNISolone  (MEDROL  DOSEPAK) 4 MG TBPK tablet Take as directed 08/03/22   Rodriguez-Southworth, Sylvia, PA-C  Multiple Vitamin (MULTIVITAMIN WITH MINERALS) TABS tablet Take 1 tablet by mouth daily.    [provider]  ondansetron  (ZOFRAN ) 4 MG tablet Take 1 tablet (4 mg total) by mouth every 6 (six) hours. 09/05/23   Bauer, Collin S, PA-C  Potassium 99 MG TABS Take 99 mg by mouth daily.    [provider]  Zinc 25 MG TABS Take 25 mg by mouth daily.    [provider]      Allergies    Patient has no known allergies.    Review of Systems   Review of Systems A 10 point review of systems was performed and is negative unless otherwise reported in  HPI.  Physical Exam Updated Vital Signs BP 123/66 (BP Location: Right Arm)   Pulse 93   Temp 97.9 F (36.6 C)   Resp 18   Ht 6' (1.829 m)   Wt 117.9 kg   SpO2 98%   BMI 35.26 kg/m  Physical Exam General: Normal appearing male, lying in bed.  HEENT: PERRLA, Sclera anicteric, MMM, trachea midline. Clear oropharynx. Cardiology: RRR, no murmurs/rubs/gallops.  Resp: Normal respiratory rate and effort. CTAB, no wheezes, rhonchi, crackles.  Abd: Soft, non-tender, non-distended. No rebound tenderness or guarding.  DRE: Performed with nurse chaperone.  Normal-appearing external anal sphincter with no hemorrhoids noted.  Normal digital rectal exam with no internal masses palpated.  Dark red stool on glove. MSK: No peripheral edema or signs of trauma. Extremities without deformity or TTP. No cyanosis or clubbing. Skin: warm, dry. No rashes or ecchymoses noted. No petechiae or purpura. Back: No CVA tenderness Neuro: A&Ox4, CNs II-XII grossly intact. MAEs. Sensation grossly intact.  Psych: Normal mood and affect.   ED Results / Procedures / Treatments   Labs (all labs ordered are listed, but only abnormal results are displayed) Labs Reviewed  COMPREHENSIVE METABOLIC PANEL WITH GFR - Abnormal; Notable for the following components:      Result Value   CO2 18 (*)    Glucose, Bld 124 (*)    Creatinine, Ser 1.26 (*)    Total Bilirubin 3.7 (*)  All other components within normal limits  CBC WITH DIFFERENTIAL/PLATELET - Abnormal; Notable for the following components:   WBC 15.4 (*)    RBC 2.89 (*)    Hemoglobin 9.2 (*)    HCT 25.9 (*)    Platelets 8 (*)    Neutro Abs 9.9 (*)    Lymphs Abs 4.6 (*)    All other components within normal limits  PROTIME-INR  PATHOLOGIST SMEAR REVIEW  TYPE AND SCREEN    EKG None  Radiology CTA abdomen: 1. No active GI bleeding 2. Acute interstitial/edematous pancreatitis with mild peripancreatic inflammatory stranding; no pancreatic necrosis, no  loculated peripancreatic fluid collections, and nondilated pancreatic duct  Procedures .Critical Care  Performed by: Franklyn Sid SAILOR, MD Authorized by: Franklyn Sid SAILOR, MD   Critical care provider statement:    Critical care time (minutes):  60   Critical care was necessary to treat or prevent imminent or life-threatening deterioration of the following conditions:  Circulatory failure   Critical care was time spent personally by me on the following activities:  Development of treatment plan with patient or surrogate, discussions with consultants, evaluation of patient's response to treatment, examination of patient, ordering and review of laboratory studies, ordering and review of radiographic studies, ordering and performing treatments and interventions, pulse oximetry, re-evaluation of patient's condition, review of old charts and obtaining history from patient or surrogate   Care discussed with: accepting provider at another facility       Medications Ordered in ED Medications  pantoprazole  (PROTONIX ) injection 80 mg (80 mg Intravenous Given 12/15/23 1932)  methylPREDNISolone  sodium succinate (SOLU-MEDROL ) 125 mg/2 mL injection 125 mg (125 mg Intravenous Given 12/15/23 1932)  iohexol  (OMNIPAQUE ) 350 MG/ML injection 85 mL (85 mLs Intravenous Contrast Given 12/15/23 2011)    ED Course/ Medical Decision Making/ A&P                          Medical Decision Making Amount and/or Complexity of Data Reviewed Labs:  Decision-making details documented in ED Course. Radiology: ordered. Decision-making details documented in ED Course.  Risk Prescription drug management.    This patient presents to the ED for concern of GIB, h/o TTP; this involves an extensive number of treatment options, and is a complaint that carries with it a high risk of complications and morbidity.  I considered the following differential and admission for this acute, potentially life threatening condition. Pt is  overall well-appearing, HDS.  MDM:    Greatest c/f GIB and TTP. Patient does have positive hemoccult and melena. He has h/o NSAID consumption, consider possible PUD/gastritis as cause or AVMs vs Dieulafoy lesions. Lower suspicion for Mallory-Weiss tears vs malignant lesions vs Boerhaave's. Giving protonix . He is HDS w/ good blood pressure, no c/f hemorrhagic shock at this time. He has platelet 8 down from 260 eleven days ago and Hgb 9.2 dropped from 15.8 eleven days ago. Ordered CTA abdomen to evaluate for active GIB and will consult w/ specialists.   Clinical Course as of 12/27/23 1142  Wed Dec 15, 2023  1831 Platelets(!!): 8 H/o TTP. Was 260 eleven days ago. [HN]  1831 Hemoglobin(!): 9.2 Dropped from 15.8 eleven days ago [HN]  1831 BUN: 17 BUN normal [HN]  1832 Path Review:  Thrombocytopenia with occasional schistocytes [HN]  1838 Consulted to hematology, gastroenterology. Ordered CT angio GIB. [HN]  1913 D/w Dr. Autumn w/ hem/onc who will look over chart and call me back  [HN]  1919  D/w Dr. Wilhelmenia with [facility] who will put him on his list to see in the AM.  [HN]  1921 D/w Dr. Autumn who recommends transfer to [facility] for plasma exchange. 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 Will call [facility] transfer center. [HN]  2002 D/w Dr. Alvena with hematology at [facility] who is familiar with this patient and relays that this patient had been previously lost to follow-up. Dr. Alvena recommends not delaying transfer to [facility] to wait for his CT angiogram GIB. Patient is HDS and I agree that this is reasonable. 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  [HN]  2051 CT ANGIO GI BLEED 1. No active GI bleeding 2. Acute interstitial/edematous pancreatitis with mild peripancreatic inflammatory stranding; no pancreatic necrosis, no loculated peripancreatic fluid collections, and nondilated  pancreatic duct   [HN]  2051 Angiogram had been performed prior to consult call and now results w/ pancreatitis, no GIB.  [HN]  2052 [facility] transfer en route [HN]    Clinical Course User Index [HN] Franklyn Sid SAILOR, MD    Labs: I Ordered, and personally interpreted labs.  The pertinent results include:  those listed above  Imaging Studies ordered: I ordered imaging studies including CTA belly I independently visualized and interpreted imaging. I agree with the radiologist interpretation  Additional history obtained from chart review, partner at [facility]  Cardiac Monitoring: The patient was maintained on a cardiac monitor.  I personally viewed and interpreted the cardiac monitored which showed an underlying rhythm of: NSR  Reevaluation: After the interventions noted above, I reevaluated the patient and found that they have :stayed the same  Social Determinants of Health: Lives independently  Disposition:  Transfer to [facility]  Co morbidities that complicate the patient evaluation  Past Medical History:  Diagnosis Date   Sickle cell trait    Thrombotic thrombocytopenic purpura (TTP) (HCC)      Medicines No orders of the defined types were placed in this encounter.   I have reviewed the patients home medicines and have made adjustments as needed  Problem List / ED Course: Problem List Items Addressed This Visit   None Visit Diagnoses       History of TTP (thrombotic thrombocytopenic purpura)    -  Primary     Melena         Acute anemia                       This note was created using dictation software, which may contain spelling or grammatical errors.    Franklyn Sid SAILOR, MD 12/27/23 1155

## 2023-12-15 NOTE — ED Notes (Signed)
   12/15/23 1847 12/15/23 1848 12/15/23 1849  Vitals  BP (!) 126/91 (!) 136/93 138/88  MAP (mmHg) 102 105 101  Patient Position (if appropriate) Lying Sitting Standing  Pulse Rate 93 91 94  ECG Heart Rate 93 99 96  Resp (!) 26 (!) 23 20  MEWS COLOR  MEWS Score Color Yellow Green Green  Oxygen Therapy  SpO2 100 % 100 % 100 %   ORTHOSTATICS NEGATIVE

## 2023-12-15 NOTE — ED Notes (Signed)
PT WAS CALLED TWICE NO ANSWER

## 2023-12-15 NOTE — ED Notes (Signed)
 Called [facility] nurse spoke with [facility]  unsure when they would be able to get the pt dr franklyn needs tx asap

## 2023-12-15 NOTE — ED Triage Notes (Signed)
 Pt c/o upper abdominal pain and soft stools x3 days.  Denies n/v.

## 2023-12-15 NOTE — ED Notes (Addendum)
 PT WAS CALLED TO REASSESS VITALS PAT DENIED VITALS DUE TO BEING UPSET ABOUT WAIT TIME.

## 2023-12-15 NOTE — ED Notes (Signed)
 Pt transported to [facility] via [facility].

## 2023-12-15 NOTE — ED Provider Triage Note (Signed)
 Emergency Medicine Provider Triage Evaluation Note  Richard [name] , a 33 y.o. male  was evaluated in triage.  Pt complains of abdominal pain and loose black stools.  Had a hemorrhoid procedure last week.  Patient has been taking 800 mg ibuprofen  3 times daily.  Patient has TTP.  Patient reports getting over upper respiratory infection and associated chest tightness  Review of Systems  Positive: Black stools, fatigue, nausea, vomiting, abdominal pain Negative: Hematemesis, rectal pain, fever, chills  Physical Exam  BP 123/66 (BP Location: Right Arm)   Pulse 93   Temp 97.9 F (36.6 C)   Resp 18   Ht 6' (1.829 m)   Wt 117.9 kg   SpO2 98%   BMI 35.26 kg/m  Gen:   Awake, no distress   Resp:  Normal effort  MSK:   Moves extremities without difficulty  Other:    Medical Decision Making  Medically screening exam initiated at 1:07 PM.  Appropriate orders placed.  Richard [name] was informed that the remainder of the evaluation will be completed by another provider, this initial triage assessment does not replace that evaluation, and the importance of remaining in the ED until their evaluation is complete.  Labs ordered   Francis Ileana SAILOR, PA-C 12/15/23 1309

## 2023-12-15 NOTE — ED Notes (Signed)
 Called hemology at [facility]

## 2023-12-15 NOTE — ED Notes (Signed)
 Spoke with transfer center. Pt has a ready bed([phone]). Transfer center will put pt on [facility] transport list for pickup.    Unit number [phone]  Charge [phone]

## 2023-12-15 NOTE — ED Notes (Addendum)
 DUPLICATE
